# Patient Record
Sex: Female | Born: 1965
Health system: Southern US, Community
[De-identification: ages and names within clinical notes are randomized; demographics above are authoritative.]

## PROBLEM LIST (undated history)

## (undated) ENCOUNTER — Ambulatory Visit

## (undated) ENCOUNTER — Encounter

## (undated) ENCOUNTER — Encounter: Attending: Ambulatory Care | Primary: Ambulatory Care

## (undated) ENCOUNTER — Telehealth

## (undated) ENCOUNTER — Ambulatory Visit: Payer: MEDICARE

## (undated) ENCOUNTER — Ambulatory Visit: Payer: MEDICARE | Attending: General Practice | Primary: General Practice

## (undated) ENCOUNTER — Encounter: Attending: Rheumatology | Primary: Rheumatology

## (undated) ENCOUNTER — Telehealth: Attending: Ambulatory Care | Primary: Ambulatory Care

## (undated) ENCOUNTER — Ambulatory Visit: Attending: Ambulatory Care | Primary: Ambulatory Care

## (undated) ENCOUNTER — Ambulatory Visit: Payer: Medicare (Managed Care) | Attending: Rheumatology | Primary: Rheumatology

## (undated) DIAGNOSIS — M797 Fibromyalgia: Secondary | ICD-10-CM

## (undated) DIAGNOSIS — D638 Anemia in other chronic diseases classified elsewhere: Secondary | ICD-10-CM

## (undated) DIAGNOSIS — F419 Anxiety disorder, unspecified: Secondary | ICD-10-CM

## (undated) DIAGNOSIS — IMO0002 Reserved for concepts with insufficient information to code with codable children: Secondary | ICD-10-CM

## (undated) DIAGNOSIS — J309 Allergic rhinitis, unspecified: Secondary | ICD-10-CM

## (undated) DIAGNOSIS — K219 Gastro-esophageal reflux disease without esophagitis: Secondary | ICD-10-CM

## (undated) DIAGNOSIS — N2889 Other specified disorders of kidney and ureter: Secondary | ICD-10-CM

## (undated) DIAGNOSIS — R42 Dizziness and giddiness: Secondary | ICD-10-CM

## (undated) DIAGNOSIS — R011 Cardiac murmur, unspecified: Secondary | ICD-10-CM

## (undated) DIAGNOSIS — R569 Unspecified convulsions: Secondary | ICD-10-CM

## (undated) DIAGNOSIS — D892 Hypergammaglobulinemia, unspecified: Secondary | ICD-10-CM

## (undated) DIAGNOSIS — Z9889 Other specified postprocedural states: Secondary | ICD-10-CM

## (undated) DIAGNOSIS — E049 Nontoxic goiter, unspecified: Secondary | ICD-10-CM

## (undated) DIAGNOSIS — I10 Essential (primary) hypertension: Secondary | ICD-10-CM

## (undated) DIAGNOSIS — E040 Nontoxic diffuse goiter: Secondary | ICD-10-CM

## (undated) DIAGNOSIS — D72819 Decreased white blood cell count, unspecified: Secondary | ICD-10-CM

## (undated) DIAGNOSIS — M329 Systemic lupus erythematosus, unspecified: Secondary | ICD-10-CM

## (undated) HISTORY — DX: Systemic lupus erythematosus, unspecified: M32.9

## (undated) HISTORY — DX: Anemia in other chronic diseases classified elsewhere: D63.8

## (undated) HISTORY — DX: Hypergammaglobulinemia, unspecified: D89.2

## (undated) HISTORY — DX: Decreased white blood cell count, unspecified: D72.819

## (undated) HISTORY — DX: Nontoxic diffuse goiter: E04.0

## (undated) HISTORY — PX: KIDNEY SURGERY: SHX687

## (undated) HISTORY — PX: MYOMECTOMY ABDOMINAL APPROACH: SUR870

## (undated) HISTORY — PX: OTHER SURGICAL HISTORY: SHX169

## (undated) HISTORY — PX: ABDOMINAL HYSTERECTOMY: SHX81

## (undated) HISTORY — PX: TUMOR REMOVAL: SHX12

---
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
## 2003-04-11 ENCOUNTER — Other Ambulatory Visit: Admission: RE | Admit: 2003-04-11 | Discharge: 2003-04-11 | Payer: Self-pay | Admitting: Obstetrics and Gynecology

## 2003-09-11 ENCOUNTER — Encounter (INDEPENDENT_AMBULATORY_CARE_PROVIDER_SITE_OTHER): Payer: Self-pay | Admitting: Specialist

## 2003-09-11 ENCOUNTER — Ambulatory Visit (HOSPITAL_BASED_OUTPATIENT_CLINIC_OR_DEPARTMENT_OTHER): Admission: RE | Admit: 2003-09-11 | Discharge: 2003-09-11 | Payer: Self-pay | Admitting: Urology

## 2003-12-10 ENCOUNTER — Ambulatory Visit (HOSPITAL_COMMUNITY): Admission: RE | Admit: 2003-12-10 | Discharge: 2003-12-10 | Payer: Self-pay | Admitting: Pediatric Hematology

## 2003-12-17 ENCOUNTER — Encounter (HOSPITAL_COMMUNITY): Admission: RE | Admit: 2003-12-17 | Discharge: 2003-12-18 | Payer: Self-pay | Admitting: Family Medicine

## 2006-01-05 ENCOUNTER — Ambulatory Visit (HOSPITAL_COMMUNITY): Admission: RE | Admit: 2006-01-05 | Discharge: 2006-01-05 | Payer: Self-pay | Admitting: Family Medicine

## 2006-01-06 ENCOUNTER — Ambulatory Visit (HOSPITAL_COMMUNITY): Admission: RE | Admit: 2006-01-06 | Discharge: 2006-01-06 | Payer: Self-pay | Admitting: Family Medicine

## 2008-08-12 ENCOUNTER — Emergency Department (HOSPITAL_COMMUNITY): Admission: EM | Admit: 2008-08-12 | Discharge: 2008-08-12 | Payer: Self-pay | Admitting: Emergency Medicine

## 2008-08-13 ENCOUNTER — Emergency Department (HOSPITAL_COMMUNITY): Admission: EM | Admit: 2008-08-13 | Discharge: 2008-08-14 | Payer: Self-pay | Admitting: Emergency Medicine

## 2008-08-15 ENCOUNTER — Ambulatory Visit (HOSPITAL_COMMUNITY): Admission: RE | Admit: 2008-08-15 | Discharge: 2008-08-15 | Payer: Self-pay | Admitting: Family Medicine

## 2008-12-25 ENCOUNTER — Emergency Department (HOSPITAL_COMMUNITY): Admission: EM | Admit: 2008-12-25 | Discharge: 2008-12-25 | Payer: Self-pay | Admitting: Emergency Medicine

## 2009-03-14 HISTORY — PX: ABDOMINAL HYSTERECTOMY: SHX81

## 2009-03-20 ENCOUNTER — Ambulatory Visit (HOSPITAL_COMMUNITY): Admission: RE | Admit: 2009-03-20 | Discharge: 2009-03-20 | Payer: Self-pay | Admitting: Family Medicine

## 2009-03-31 ENCOUNTER — Ambulatory Visit (HOSPITAL_COMMUNITY): Admission: RE | Admit: 2009-03-31 | Discharge: 2009-03-31 | Payer: Self-pay | Admitting: Family Medicine

## 2009-04-20 ENCOUNTER — Ambulatory Visit (HOSPITAL_COMMUNITY): Payer: Self-pay | Admitting: Oncology

## 2009-07-22 ENCOUNTER — Ambulatory Visit (HOSPITAL_COMMUNITY): Admission: RE | Admit: 2009-07-22 | Discharge: 2009-07-22 | Payer: Self-pay | Admitting: Urology

## 2010-02-10 ENCOUNTER — Ambulatory Visit (HOSPITAL_COMMUNITY)
Admission: RE | Admit: 2010-02-10 | Discharge: 2010-02-10 | Payer: Self-pay | Source: Home / Self Care | Admitting: Urology

## 2010-03-19 ENCOUNTER — Ambulatory Visit (HOSPITAL_COMMUNITY)
Admission: RE | Admit: 2010-03-19 | Discharge: 2010-03-19 | Payer: Self-pay | Source: Home / Self Care | Attending: Family Medicine | Admitting: Family Medicine

## 2010-04-04 ENCOUNTER — Encounter: Payer: Self-pay | Admitting: Urology

## 2010-04-14 ENCOUNTER — Encounter: Payer: Self-pay | Admitting: Family Medicine

## 2010-06-01 LAB — CREATININE, SERUM
Creatinine, Ser: 0.78 mg/dL (ref 0.4–1.2)
GFR calc Af Amer: >60 mL/min (ref >60)
GFR calc non Af Amer: >60 mL/min (ref >60)

## 2010-06-17 LAB — POCT URINALYSIS DIP (DEVICE)
Bilirubin Urine: NEGATIVE
Glucose, UA: NEGATIVE mg/dL
Ketones, ur: NEGATIVE mg/dL
Nitrite: NEGATIVE
Protein, ur: NEGATIVE mg/dL
Specific Gravity, Urine: 1.02 (ref 1.005–1.030)
Urobilinogen, UA: 0.2 mg/dL (ref 0.0–1.0)
pH: 7 (ref 5.0–8.0)

## 2010-06-17 LAB — POCT PREGNANCY, URINE: Preg Test, Ur: NEGATIVE

## 2010-06-21 LAB — BASIC METABOLIC PANEL
GFR calc Af Amer: >60 mL/min (ref >60)
GFR calc non Af Amer: >60 mL/min (ref >60)
Potassium: 3.6 mEq/L (ref 3.5–5.1)
Sodium: 137 mEq/L (ref 135–145)

## 2010-06-21 LAB — POCT CARDIAC MARKERS
CKMB, poc: <1 ng/mL — ABNORMAL LOW (ref 1.0–8.0)
CKMB, poc: <1 ng/mL — ABNORMAL LOW (ref 1.0–8.0)
CKMB, poc: <1 ng/mL — ABNORMAL LOW (ref 1.0–8.0)
Troponin i, poc: <0.05 ng/mL (ref 0.00–0.09)
Troponin i, poc: <0.05 ng/mL (ref 0.00–0.09)
Troponin i, poc: <0.05 ng/mL (ref 0.00–0.09)

## 2010-06-21 LAB — POCT I-STAT, CHEM 8
BUN: 15 mg/dL (ref 6–23)
Calcium, Ion: 1.22 mmol/L (ref 1.12–1.32)
Chloride: 107 mEq/L (ref 96–112)
Creatinine, Ser: 0.8 mg/dL (ref 0.4–1.2)
Glucose, Bld: 102 mg/dL — ABNORMAL HIGH (ref 70–99)
HCT: 44 % (ref 36.0–46.0)
Hemoglobin: 15 g/dL (ref 12.0–15.0)
Potassium: 3.6 mEq/L (ref 3.5–5.1)
Sodium: 142 mEq/L (ref 135–145)
TCO2: 23 mmol/L (ref 0–100)

## 2010-06-21 LAB — URINALYSIS, ROUTINE W REFLEX MICROSCOPIC
Bilirubin Urine: NEGATIVE
Glucose, UA: NEGATIVE mg/dL
Ketones, ur: NEGATIVE mg/dL
Nitrite: NEGATIVE
Protein, ur: NEGATIVE mg/dL
Specific Gravity, Urine: 1.027 (ref 1.005–1.030)
Urobilinogen, UA: 1 mg/dL (ref 0.0–1.0)
pH: 6 (ref 5.0–8.0)

## 2010-06-21 LAB — URINE CULTURE: Colony Count: 6000

## 2010-06-21 LAB — DIFFERENTIAL
Eosinophils Relative: 0 % (ref 0–5)
Lymphocytes Relative: 27 % (ref 12–46)
Lymphs Abs: 1.3 10*3/uL (ref 0.7–4.0)
Monocytes Absolute: 0.5 10*3/uL (ref 0.1–1.0)
Neutro Abs: 2.8 10*3/uL (ref 1.7–7.7)

## 2010-06-21 LAB — TSH: TSH: 1.601 u[IU]/mL (ref 0.350–4.500)

## 2010-06-21 LAB — URINE MICROSCOPIC-ADD ON

## 2010-06-21 LAB — CBC
HCT: 39.6 % (ref 36.0–46.0)
Hemoglobin: 14 g/dL (ref 12.0–15.0)
RBC: 4.13 MIL/uL (ref 3.87–5.11)
WBC: 4.7 10*3/uL (ref 4.0–10.5)

## 2010-06-21 LAB — MAGNESIUM: Magnesium: 2.4 mg/dL (ref 1.5–2.5)

## 2010-06-21 LAB — POCT PREGNANCY, URINE: Preg Test, Ur: NEGATIVE

## 2010-07-30 NOTE — Op Note (Signed)
[name], Dawn Marsh                          ACCOUNT NO.:  [id number]   MEDICAL RECORD NO.:  [id number]                   PATIENT TYPE:  AMB   LOCATION:  [facility]                                 FACILITY:  [facility]   PHYSICIAN:  Sigmund I. Patsi Sears, M.D.         DATE OF BIRTH:  Sep 27, 1965   DATE OF PROCEDURE:  09/11/2003  DATE OF DISCHARGE:                                 OPERATIVE REPORT   PREOPERATIVE DIAGNOSIS:  Interstitial cystitis.   POSTOPERATIVE DIAGNOSIS:  Interstitial cystitis versus endometriosis.   OPERATION:  1. Cystourethroscopy.  2. Hydrodistention of the bladder (1000 mL).  3. Cold cup bladder biopsy of left biopsy base left lateral bladder wall.  4. Cauterization of the biopsy sites.  5. Instillation of Clorpactin x 3 minutes.  6. Instillation of Pyridium/Marcaine.  7. Injection of Marcaine and Kenalog at the bladder base.   SURGEON:  Sigmund I. Patsi Sears, M.D.   ANESTHESIA:  General (LMA).   PREPARATION:  After appropriate preanesthesia, the patient is brought to the  operating room and placed on the operating table in the dorsal supine  position where general LMA anesthesia was introduced.  She was then placed  in the dorsal lithotomy position where the pubis was prepped with Betadine  solution and draped in the usual fashion.   REVIEW OF HISTORY:  This 45 year old female has a history of perineal pain,  dysuria which is cyclical, predominantly premenstrual.  She has had  suprapubic pain on urination, particularly during menstrual cycle.  She has  urinary frequency and has had her symptoms since age 45.  She also notes  dyspareunia with no fever, no chills, no kidney stones, no gross hematuria,  no fibromyalgia, no irritable bowel, and no asthma.  The patient is now for  cystoscopy, bladder biopsy, evaluation to rule out IC.  Note, her IC symptom  score; she is 18.   DESCRIPTION OF PROCEDURE:  Cystourethroscopy was accomplished.  The bladder  holds 1000  mL.  A markedly abnormal area is noted at the left lateral  bladder wall, above and lateral to the left ureteral orifice.  This abnormal  area consists of 2 definite chocolate cyst appearing lesions and also an  area of chronic inflammatory polyps in the bladder with raised irritated,  erythematous base.  All three areas are excisionally biopsied with the cold  cup biopsy forceps and the entire area cauterized.  The remaining bladder  appeared normal, and Clorpactin was inserted for 3 minutes.  Then Pyridium  and Marcaine was inserted.  There was some hemorrhage of the remaining  bladder, but definite IC is not noted.  The bladder base is injected with  Marcaine/Kenalog, and the patient is awakened and taken to the recovery room  in good condition.  Note, she is LATEX allergic, and all latex allergy  precautions are followed.  Sigmund I. Patsi Sears, M.D.    SIT/MEDQ  D:  09/11/2003  T:  09/11/2003  Job:  [id number]

## 2010-11-16 ENCOUNTER — Other Ambulatory Visit (HOSPITAL_COMMUNITY): Payer: Self-pay | Admitting: Urology

## 2010-11-16 DIAGNOSIS — D49519 Neoplasm of unspecified behavior of unspecified kidney: Secondary | ICD-10-CM

## 2010-12-11 ENCOUNTER — Ambulatory Visit (HOSPITAL_COMMUNITY)
Admission: RE | Admit: 2010-12-11 | Discharge: 2010-12-11 | Disposition: A | Discharge status: Home / Self Care | Payer: 59 | Source: Ambulatory Visit | Attending: Urology | Admitting: Urology

## 2010-12-11 DIAGNOSIS — N289 Disorder of kidney and ureter, unspecified: Secondary | ICD-10-CM | POA: Insufficient documentation

## 2010-12-11 DIAGNOSIS — K7689 Other specified diseases of liver: Secondary | ICD-10-CM | POA: Insufficient documentation

## 2010-12-11 DIAGNOSIS — D49519 Neoplasm of unspecified behavior of unspecified kidney: Secondary | ICD-10-CM

## 2010-12-11 DIAGNOSIS — D1803 Hemangioma of intra-abdominal structures: Secondary | ICD-10-CM | POA: Insufficient documentation

## 2010-12-11 MED ORDER — GADOPENTETATE DIMEGLUMINE 469.01 MG/ML IV SOLN
15.0000 mL | Freq: Once | INTRAVENOUS | Status: AC | PRN
  Administered 2010-12-11: 15 mL via INTRAVENOUS

## 2011-02-17 ENCOUNTER — Encounter: Payer: Self-pay | Admitting: *Deleted

## 2011-02-17 ENCOUNTER — Emergency Department (HOSPITAL_COMMUNITY)
Admission: EM | Admit: 2011-02-17 | Discharge: 2011-02-17 | Disposition: A | Discharge status: Home / Self Care | Payer: Worker's Compensation | Attending: Emergency Medicine | Admitting: Emergency Medicine

## 2011-02-17 DIAGNOSIS — R51 Headache: Secondary | ICD-10-CM | POA: Insufficient documentation

## 2011-02-17 DIAGNOSIS — Z9189 Other specified personal risk factors, not elsewhere classified: Secondary | ICD-10-CM | POA: Insufficient documentation

## 2011-02-17 DIAGNOSIS — G43909 Migraine, unspecified, not intractable, without status migrainosus: Secondary | ICD-10-CM | POA: Insufficient documentation

## 2011-02-17 DIAGNOSIS — R42 Dizziness and giddiness: Secondary | ICD-10-CM | POA: Insufficient documentation

## 2011-02-17 HISTORY — DX: Dizziness and giddiness: R42

## 2011-02-17 LAB — CARBOXYHEMOGLOBIN: Methemoglobin: 0.4 % (ref 0.0–1.5)

## 2011-02-17 NOTE — ED Provider Notes (Signed)
History     CSN: [id number] Arrival date & time: 02/17/2011  3:03 PM   First MD Initiated Contact with Patient 02/17/11 1754      Chief Complaint  Patient presents with  . Labs Only    Carbon monoxide testing    (Consider location/radiation/quality/duration/timing/severity/associated sxs/prior treatment) HPI  Past Medical History  Diagnosis Date  . Vertigo   . Migraine     History reviewed. No pertinent past surgical history.  History reviewed. No pertinent family history.  History  Substance Use Topics  . Smoking status: Never Smoker   . Smokeless tobacco: Not on file  . Alcohol Use: Yes    OB History    Grav Para Term Preterm Abortions TAB SAB Ect Mult Living                  Review of Systems  Allergies  Penicillins  Home Medications   Current Outpatient Rx  Name Route Sig Dispense Refill  . DIAZEPAM 2 MG PO TABS Oral Take 2 mg by mouth every 6 (six) hours as needed. For vertigo    . ELETRIPTAN HYDROBROMIDE 20 MG PO TABS Oral One tablet by mouth as needed for migraine headache.  If the headache improves and then returns, dose may be repeated after 2 hours have elapsed since first dose (do not exceed 80 mg per day). For migraine may repeat in 2 hours if necessary     . HYDROCHLOROTHIAZIDE 25 MG PO TABS Oral Take 25 mg by mouth daily.      Marland Kitchen POTASSIUM CHLORIDE CRYS CR 20 MEQ PO TBCR Oral Take 20 mEq by mouth daily.        BP 133/89  Pulse 93  Temp(Src) 98.5 F (36.9 C) (Oral)  Resp 16  SpO2 100%  Physical Exam  ED Course  Procedures (including critical care time)   Labs Reviewed  CARBOXYHEMOGLOBIN   No results found.   1. History of carbon monoxide poisoning       MDM  Carboxy hemoglobin is 13.4, which was is within normal range        Arman Filter, NP 02/17/11 2142

## 2011-02-17 NOTE — ED Notes (Signed)
PBT called back to BS to draw carboxy hgb, initially sent away by pt, pt OK with lab draw, PBT and RT aware, RT waiting on sample from PBT. Pt, RN and MD aware. Pt calm, NAD, alert, interactive.

## 2011-02-17 NOTE — ED Notes (Signed)
Pt sent here or carbon monoxide testing; pt sts bad smell at work and sent here by [employer]

## 2011-02-17 NOTE — ED Notes (Signed)
Report given and care endorsed to John, RN.

## 2011-02-17 NOTE — ED Notes (Signed)
Pt reports that she may have accidentally been exposed to carbon monoxide at her work place.  She works for a company that [employer]".  She reports that for a few weeks she has observed a "weird smell" and reported it to her employer and was sent here for testing to determine if there has been an adverse reaction. She states that sometimes she feels dizzy and a little nauseated without emesis. She also reports mild intermittent dyspnea.  She denies N/V and dyspnea at this time.

## 2011-02-17 NOTE — ED Provider Notes (Signed)
History     CSN: [id number] Arrival date & time: 02/17/2011  3:03 PM   First MD Initiated Contact with Patient 02/17/11 1754      Chief Complaint  Patient presents with  . Labs Only    Carbon monoxide testing    (Consider location/radiation/quality/duration/timing/severity/associated sxs/prior treatment) The history is provided by the patient.   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 Patient relates a history of having bad smell at work and having problems with some mild headaches, she was referred by [employer]. in the health care clinic at work. She has now been away from the work environment for approximately 5 hours. She is currently without symptoms. Patient is a non-smoker. She does relate a history that she would feel better when she would go home and symptoms reoccur when she would get back to work. Past medical history is negative for history of migraines and vertigo, and based on medications history of possibly hypertension.  Past Medical History  Diagnosis Date  . Vertigo   . Migraine     History reviewed. No pertinent past surgical history.  History reviewed. No pertinent family history.  History  Substance Use Topics  . Smoking status: Never Smoker   . Smokeless tobacco: Not on file  . Alcohol Use: Yes    OB History    Grav Para Term Preterm Abortions TAB SAB Ect Mult Living                  Review of Systems  Constitutional: Negative for fever, chills, diaphoresis and fatigue.  HENT: Negative for congestion, sore throat, neck pain and sinus pressure.   Respiratory: Negative for cough, chest tightness, shortness of breath and wheezing.   Cardiovascular: Negative for chest pain and palpitations.  Gastrointestinal: Negative for nausea, vomiting, abdominal pain and diarrhea.    Genitourinary: Negative for dysuria and hematuria.  Musculoskeletal: Negative for myalgias and back pain.  Skin: Negative for pallor and rash.  Neurological: Positive for headaches. Negative for dizziness, seizures, syncope, speech difficulty and weakness.  Psychiatric/Behavioral: Negative for confusion.    Allergies  Penicillins  Home Medications   Current Outpatient Rx  Name Route Sig Dispense Refill  . DIAZEPAM 2 MG PO TABS Oral Take 2 mg by mouth every 6 (six) hours as needed. For vertigo    . ELETRIPTAN HYDROBROMIDE 20 MG PO TABS Oral One tablet by mouth as needed for migraine headache.  If the headache improves and then returns, dose may be repeated after 2 hours have elapsed since first dose (do not exceed 80 mg per day). For migraine may repeat in 2 hours if necessary     . HYDROCHLOROTHIAZIDE 25 MG PO TABS Oral Take 25 mg by mouth daily.      Marland Kitchen POTASSIUM CHLORIDE CRYS CR 20 MEQ PO TBCR Oral Take 20 mEq by mouth daily.        BP 123/85  Pulse 93  Temp(Src) 98.5 F (36.9 C) (Oral)  Resp 20  SpO2 100%  Physical Exam  Nursing note and vitals reviewed. Constitutional: She is oriented to person, place, and time. She appears well-developed and well-nourished. No distress.  HENT:  Head: Normocephalic and atraumatic.  Mouth/Throat: Oropharynx is clear and moist.  Eyes: Conjunctivae and EOM are normal. Pupils are equal, round, and reactive to  light.  Neck: Normal range of motion. Neck supple.  Cardiovascular: Normal rate, regular rhythm, normal heart sounds and intact distal pulses.   No murmur heard. Pulmonary/Chest: Effort normal and breath sounds normal.  Musculoskeletal: Normal range of motion. She exhibits no edema.  Neurological: She is alert and oriented to person, place, and time. No cranial nerve deficit. She exhibits normal muscle tone. Coordination normal.  Skin: Skin is warm. No rash noted. No erythema.    ED Course  Procedures (including critical care  time)   Labs Reviewed  CARBOXYHEMOGLOBIN   No results found.   Impression: Possible work related carbon oxide exposure.   MDM   Carbon monoxide level pending. Patient referred in by work for possible carbon monoxide exposure. Patient is not certain whether an atmospheric testing was done in the work environment to determine if there is elevated levels of carbon monoxide, however she was feeling one that was referred in for testing. He shouldn't double W. works in this one area she has been having some complaints of not feeling well at work strange smell in the carbon monoxide normally odorless and some mild headaches, this usually improves when she goes home and then recurs when she gets back to work. Patient is being followed by the health clinic to work. Currently patient is with minimal symptoms however she has been out of work environment now for approximately 5 hours.   A carbon monoxide level is still pending, patient will be turned over to the evening mid-level will followup on the results and as per the patient appropriately.  Medical screening examination/treatment/procedure(s) were conducted as a shared visit with non-physician practitioner(s) and myself.  I personally evaluated the patient during the encounter     Shelda Jakes, MD 02/17/11 2002

## 2011-02-18 NOTE — ED Provider Notes (Signed)
Medical screening examination/treatment/procedure(s) were conducted as a shared visit with non-physician practitioner(s) and myself.  I personally evaluated the patient during the encounter  Results for orders placed during the hospital encounter of 02/17/11  CARBOXYHEMOGLOBIN      Component Value Range   Total hemoglobin 13.0  12.5 - 16.0 (g/dL)   O2 Saturation 16.1     Carboxyhemoglobin 0.5  0.5 - 1.5 (%)   Methemoglobin 0.4  0.0 - 1.5 (%)    CO level per lab within normal levels not cw sig CO exposure exceot as noted in my original note patient has been away from work space for several hours. Work air still needs to be tested.     Shelda Jakes, MD 02/18/11 1149

## 2011-04-22 ENCOUNTER — Ambulatory Visit (HOSPITAL_COMMUNITY)
Admission: RE | Admit: 2011-04-22 | Discharge: 2011-04-22 | Disposition: A | Discharge status: Home / Self Care | Payer: 59 | Source: Ambulatory Visit | Attending: Family Medicine | Admitting: Family Medicine

## 2011-04-22 ENCOUNTER — Other Ambulatory Visit (HOSPITAL_COMMUNITY): Payer: Self-pay | Admitting: Family Medicine

## 2011-04-22 DIAGNOSIS — R079 Chest pain, unspecified: Secondary | ICD-10-CM | POA: Insufficient documentation

## 2011-05-11 ENCOUNTER — Other Ambulatory Visit: Payer: Self-pay | Admitting: Urology

## 2011-05-11 DIAGNOSIS — N2889 Other specified disorders of kidney and ureter: Secondary | ICD-10-CM

## 2011-05-17 ENCOUNTER — Ambulatory Visit
Admission: RE | Admit: 2011-05-17 | Discharge: 2011-05-17 | Disposition: A | Discharge status: Home / Self Care | Payer: 59 | Source: Ambulatory Visit | Attending: Urology | Admitting: Urology

## 2011-05-17 VITALS — BP 106/77 | HR 96 | Temp 98.2°F | Resp 16 | Ht 65.0 in | Wt 138.0 lb

## 2011-05-17 DIAGNOSIS — N2889 Other specified disorders of kidney and ureter: Secondary | ICD-10-CM

## 2011-05-17 HISTORY — DX: Nontoxic goiter, unspecified: E04.9

## 2011-05-17 HISTORY — DX: Essential (primary) hypertension: I10

## 2011-05-17 HISTORY — DX: Other specified disorders of kidney and ureter: N28.89

## 2011-05-24 ENCOUNTER — Other Ambulatory Visit (HOSPITAL_COMMUNITY): Payer: Self-pay | Admitting: Interventional Radiology

## 2011-05-24 DIAGNOSIS — N2889 Other specified disorders of kidney and ureter: Secondary | ICD-10-CM

## 2011-06-10 ENCOUNTER — Other Ambulatory Visit: Payer: Self-pay | Admitting: Radiology

## 2011-06-13 ENCOUNTER — Encounter (HOSPITAL_COMMUNITY): Payer: Self-pay | Admitting: Pharmacy Technician

## 2011-06-13 ENCOUNTER — Encounter (HOSPITAL_COMMUNITY)
Admission: RE | Admit: 2011-06-13 | Discharge: 2011-06-13 | Disposition: A | Discharge status: Home / Self Care | Payer: 59 | Source: Ambulatory Visit | Attending: Interventional Radiology | Admitting: Interventional Radiology

## 2011-06-13 ENCOUNTER — Encounter (HOSPITAL_COMMUNITY): Payer: Self-pay

## 2011-06-13 HISTORY — DX: Gastro-esophageal reflux disease without esophagitis: K21.9

## 2011-06-13 HISTORY — DX: Anxiety disorder, unspecified: F41.9

## 2011-06-13 HISTORY — DX: Unspecified convulsions: R56.9

## 2011-06-13 HISTORY — DX: Cardiac murmur, unspecified: R01.1

## 2011-06-13 LAB — BASIC METABOLIC PANEL
BUN: 15 mg/dL (ref 6–23)
Chloride: 105 mEq/L (ref 96–112)
GFR calc Af Amer: >90 mL/min (ref >90)
GFR calc non Af Amer: >90 mL/min (ref >90)
Glucose, Bld: 83 mg/dL (ref 70–99)
Potassium: 3.5 mEq/L (ref 3.5–5.1)
Sodium: 137 mEq/L (ref 135–145)

## 2011-06-13 LAB — PROTIME-INR
INR: 1 (ref 0.00–1.49)
Prothrombin Time: 13.4 seconds (ref 11.6–15.2)

## 2011-06-13 LAB — CBC
HCT: 33.9 % — ABNORMAL LOW (ref 36.0–46.0)
Hemoglobin: 11.6 g/dL — ABNORMAL LOW (ref 12.0–15.0)
MCHC: 34.2 g/dL (ref 30.0–36.0)
RBC: 3.64 MIL/uL — ABNORMAL LOW (ref 3.87–5.11)

## 2011-06-13 NOTE — Patient Instructions (Addendum)
YOUR SURGERY IS SCHEDULED ON:  Friday  4/12  AT 8:00 AM  REPORT TO [facility] AT: 6:30 AM      PHONE # FOR SHORT STAY IS [phone]  DO NOT EAT OR DRINK ANYTHING AFTER MIDNIGHT THE NIGHT BEFORE YOUR SURGERY.  YOU MAY BRUSH YOUR TEETH, RINSE OUT YOUR MOUTH--BUT NO WATER, NO FOOD, NO CHEWING GUM, NO MINTS, NO CANDIES, NO CHEWING TOBACCO.  PLEASE TAKE THE FOLLOWING MEDICATIONS THE AM OF YOUR SURGERY WITH A FEW SIPS OF WATER:  NO MEDS TO TAKE    IF YOU USE INHALERS--USE YOUR INHALERS THE AM OF YOUR SURGERY AND BRING INHALERS TO THE HOSPITAL -TAKE TO SURGERY.    IF YOU ARE DIABETIC:  DO NOT TAKE ANY DIABETIC MEDICATIONS THE AM OF YOUR SURGERY.  IF YOU TAKE INSULIN IN THE EVENINGS--PLEASE ONLY TAKE 1/2 NORMAL EVENING DOSE THE NIGHT BEFORE YOUR SURGERY.  NO INSULIN THE AM OF YOUR SURGERY.  IF YOU HAVE SLEEP APNEA AND USE CPAP OR BIPAP--PLEASE BRING THE MASK --NOT THE MACHINE-NOT THE TUBING   -JUST THE MASK. DO NOT BRING VALUABLES, MONEY, CREDIT CARDS.  CONTACT LENS, DENTURES / PARTIALS, GLASSES SHOULD NOT BE WORN TO SURGERY AND IN MOST CASES-HEARING AIDS WILL NEED TO BE REMOVED.  BRING YOUR GLASSES CASE, ANY EQUIPMENT NEEDED FOR YOUR CONTACT LENS. FOR PATIENTS ADMITTED TO THE HOSPITAL--CHECK OUT TIME THE DAY OF DISCHARGE IS 11:00 AM.  ALL INPATIENT ROOMS ARE PRIVATE - WITH BATHROOM, TELEPHONE, TELEVISION AND WIFI INTERNET. IF YOU ARE BEING DISCHARGED THE SAME DAY OF YOUR SURGERY--YOU CAN NOT DRIVE YOURSELF HOME--AND SHOULD NOT GO HOME ALONE BY TAXI OR BUS.  NO DRIVING OR OPERATING MACHINERY FOR 24 HOURS FOLLOWING ANESTHESIA / PAIN MEDICATIONS.                              PLEASE READ OVER ANY  FACT SHEETS THAT YOU WERE GIVEN:   BLOOD TRANSFUSION INFORMATION

## 2011-06-13 NOTE — Pre-Procedure Instructions (Signed)
PT HAS CXR REPORT FROM [facility] FROM 04/22/11--REPORT IN EPIC AND COPY ON THIS CHART. EKG REPORT WITH OFFICE NOTE 04/22/2011 FROM DR. GOLDING ON THIS CHART.  THE OFFICE NOTE SAID PT C/O OF CHEST PAIN -PT WAS ENCOURAGED TO FOLLOW UP IF WORSENS OR FAILS TO IMPROVE / REFERRAL TO CARDIOLOGY.  PT STATES SHE NEVER WENT TO CARDIOLOGY--STILL HAS THE CHEST PAIN-LEFT SIDED--CONSTANT--SOMETIMES GOES AWAY SHORT WHILE-ALWAYS RETURNS.  PT INSTRUCTED TO FOLLOW UP WITH CARDIOLOGY THIS WEEK--TO AVOID POSSIBLE CANCELLATION OF SURGERY.  PT STATES SHE WILL FOLLOW UP AND SHE KNOWS TO HAVE ANY CARDIOLOGY CLEARANCE FAXED TO DR. Antonietta Jewel OFFICE. CBC, BMET, PT, PTT DRAWN TODAY PREOP.  T/S WILL BE DRAWN THE DAY OF SURGERY.

## 2011-06-21 ENCOUNTER — Other Ambulatory Visit: Payer: Self-pay | Admitting: Radiology

## 2011-06-23 ENCOUNTER — Telehealth (HOSPITAL_COMMUNITY): Payer: Self-pay | Admitting: Interventional Radiology

## 2011-06-24 ENCOUNTER — Ambulatory Visit (HOSPITAL_COMMUNITY): Payer: 59 | Admitting: *Deleted

## 2011-06-24 ENCOUNTER — Encounter (HOSPITAL_COMMUNITY): Payer: Self-pay

## 2011-06-24 ENCOUNTER — Ambulatory Visit (HOSPITAL_COMMUNITY)
Admission: RE | Admit: 2011-06-24 | Discharge: 2011-06-25 | Disposition: A | Discharge status: Home / Self Care | Payer: 59 | Source: Ambulatory Visit | Attending: Interventional Radiology | Admitting: Interventional Radiology

## 2011-06-24 ENCOUNTER — Encounter (HOSPITAL_COMMUNITY)
Admission: RE | Disposition: A | Discharge status: Home / Self Care | Payer: Self-pay | Source: Ambulatory Visit | Attending: Interventional Radiology

## 2011-06-24 ENCOUNTER — Ambulatory Visit (HOSPITAL_COMMUNITY)
Admission: RE | Admit: 2011-06-24 | Discharge: 2011-06-24 | Disposition: A | Discharge status: Home / Self Care | Payer: 59 | Source: Ambulatory Visit | Attending: Interventional Radiology | Admitting: Interventional Radiology

## 2011-06-24 ENCOUNTER — Ambulatory Visit (HOSPITAL_COMMUNITY): Payer: 59

## 2011-06-24 ENCOUNTER — Encounter (HOSPITAL_COMMUNITY): Payer: Self-pay | Admitting: *Deleted

## 2011-06-24 DIAGNOSIS — N2889 Other specified disorders of kidney and ureter: Secondary | ICD-10-CM | POA: Diagnosis present

## 2011-06-24 DIAGNOSIS — R0789 Other chest pain: Secondary | ICD-10-CM | POA: Insufficient documentation

## 2011-06-24 DIAGNOSIS — D3000 Benign neoplasm of unspecified kidney: Secondary | ICD-10-CM | POA: Insufficient documentation

## 2011-06-24 DIAGNOSIS — Z01812 Encounter for preprocedural laboratory examination: Secondary | ICD-10-CM | POA: Insufficient documentation

## 2011-06-24 DIAGNOSIS — I10 Essential (primary) hypertension: Secondary | ICD-10-CM | POA: Insufficient documentation

## 2011-06-24 DIAGNOSIS — J9383 Other pneumothorax: Secondary | ICD-10-CM | POA: Insufficient documentation

## 2011-06-24 DIAGNOSIS — Z90710 Acquired absence of both cervix and uterus: Secondary | ICD-10-CM | POA: Insufficient documentation

## 2011-06-24 DIAGNOSIS — K219 Gastro-esophageal reflux disease without esophagitis: Secondary | ICD-10-CM | POA: Insufficient documentation

## 2011-06-24 DIAGNOSIS — Z79899 Other long term (current) drug therapy: Secondary | ICD-10-CM | POA: Insufficient documentation

## 2011-06-24 HISTORY — DX: Allergic rhinitis, unspecified: J30.9

## 2011-06-24 LAB — TYPE AND SCREEN: Antibody Screen: NEGATIVE

## 2011-06-24 SURGERY — RADIO FREQUENCY ABLATION
Anesthesia: General | Laterality: Left | Wound class: Clean

## 2011-06-24 MED ORDER — ONDANSETRON HCL 4 MG/2ML IJ SOLN
INTRAMUSCULAR | Status: DC | PRN
  Administered 2011-06-24: 4 mg via INTRAVENOUS

## 2011-06-24 MED ORDER — LACTATED RINGERS IV SOLN
INTRAVENOUS | Status: DC
  Administered 2011-06-24 (×3): via INTRAVENOUS

## 2011-06-24 MED ORDER — EPHEDRINE SULFATE 50 MG/ML IJ SOLN
INTRAMUSCULAR | Status: DC | PRN
  Administered 2011-06-24: 5 mg via INTRAVENOUS

## 2011-06-24 MED ORDER — VANCOMYCIN HCL IN DEXTROSE 1-5 GM/200ML-% IV SOLN
INTRAVENOUS | Status: AC
  Administered 2011-06-24: 1000 mg via INTRAVENOUS
  Filled 2011-06-24: qty 200

## 2011-06-24 MED ORDER — DEXAMETHASONE SODIUM PHOSPHATE 10 MG/ML IJ SOLN
INTRAMUSCULAR | Status: DC | PRN
  Administered 2011-06-24: 5 mg via INTRAVENOUS

## 2011-06-24 MED ORDER — PROMETHAZINE HCL 25 MG/ML IJ SOLN: 6.2500 mg | INTRAMUSCULAR | Status: DC | PRN

## 2011-06-24 MED ORDER — SUCCINYLCHOLINE CHLORIDE 20 MG/ML IJ SOLN
INTRAMUSCULAR | Status: DC | PRN
  Administered 2011-06-24: 100 mg via INTRAVENOUS

## 2011-06-24 MED ORDER — SENNOSIDES-DOCUSATE SODIUM 8.6-50 MG PO TABS
1.0000 | ORAL_TABLET | Freq: Every day | ORAL | Status: DC | PRN
  Filled 2011-06-24: qty 1

## 2011-06-24 MED ORDER — METOCLOPRAMIDE HCL 5 MG/ML IJ SOLN
INTRAMUSCULAR | Status: DC | PRN
  Administered 2011-06-24: 10 mg via INTRAVENOUS

## 2011-06-24 MED ORDER — NEOSTIGMINE METHYLSULFATE 1 MG/ML IJ SOLN
INTRAMUSCULAR | Status: DC | PRN
  Administered 2011-06-24: 4 mg via INTRAVENOUS

## 2011-06-24 MED ORDER — ONDANSETRON HCL 4 MG/2ML IJ SOLN
4.0000 mg | Freq: Four times a day (QID) | INTRAMUSCULAR | Status: DC | PRN
  Administered 2011-06-24: 4 mg via INTRAVENOUS
  Filled 2011-06-24: qty 2

## 2011-06-24 MED ORDER — LIDOCAINE HCL (CARDIAC) 20 MG/ML IV SOLN
INTRAVENOUS | Status: DC | PRN
  Administered 2011-06-24: 50 mg via INTRAVENOUS

## 2011-06-24 MED ORDER — HYDROCODONE-ACETAMINOPHEN 5-325 MG PO TABS: 1.0000 | ORAL_TABLET | ORAL | Status: DC | PRN

## 2011-06-24 MED ORDER — FENTANYL CITRATE 0.05 MG/ML IJ SOLN
INTRAMUSCULAR | Status: DC | PRN
  Administered 2011-06-24: 100 ug via INTRAVENOUS
  Administered 2011-06-24: 50 ug via INTRAVENOUS
  Administered 2011-06-24: 25 ug via INTRAVENOUS

## 2011-06-24 MED ORDER — DOCUSATE SODIUM 100 MG PO CAPS
100.0000 mg | ORAL_CAPSULE | Freq: Two times a day (BID) | ORAL | Status: DC
  Administered 2011-06-24 – 2011-06-25 (×3): 100 mg via ORAL
  Filled 2011-06-24 (×4): qty 1

## 2011-06-24 MED ORDER — PROMETHAZINE HCL 25 MG/ML IJ SOLN
INTRAMUSCULAR | Status: AC
  Filled 2011-06-24: qty 1

## 2011-06-24 MED ORDER — MIDAZOLAM HCL 5 MG/5ML IJ SOLN
INTRAMUSCULAR | Status: DC | PRN
  Administered 2011-06-24: 2 mg via INTRAVENOUS

## 2011-06-24 MED ORDER — HYDROMORPHONE HCL PF 1 MG/ML IJ SOLN
INTRAMUSCULAR | Status: AC
  Filled 2011-06-24: qty 1

## 2011-06-24 MED ORDER — CISATRACURIUM BESYLATE 2 MG/ML IV SOLN
INTRAVENOUS | Status: DC | PRN
  Administered 2011-06-24: 2 mg via INTRAVENOUS
  Administered 2011-06-24: 3 mg via INTRAVENOUS
  Administered 2011-06-24: 5 mg via INTRAVENOUS

## 2011-06-24 MED ORDER — GLYCOPYRROLATE 0.2 MG/ML IJ SOLN
INTRAMUSCULAR | Status: DC | PRN
  Administered 2011-06-24: .6 mg via INTRAVENOUS

## 2011-06-24 MED ORDER — ROCURONIUM BROMIDE 100 MG/10ML IV SOLN
INTRAVENOUS | Status: DC | PRN
  Administered 2011-06-24: 5 mg via INTRAVENOUS

## 2011-06-24 MED ORDER — HYDROMORPHONE HCL PF 1 MG/ML IJ SOLN
0.2500 mg | INTRAMUSCULAR | Status: DC | PRN
  Administered 2011-06-24 (×2): 0.5 mg via INTRAVENOUS

## 2011-06-24 MED ORDER — VANCOMYCIN HCL IN DEXTROSE 1-5 GM/200ML-% IV SOLN: 1000.0000 mg | INTRAVENOUS | Status: DC

## 2011-06-24 MED ORDER — PROPOFOL 10 MG/ML IV BOLUS
INTRAVENOUS | Status: DC | PRN
Start: 2011-06-24 — End: 2011-06-24
  Administered 2011-06-24: 190 mg via INTRAVENOUS

## 2011-06-24 NOTE — Anesthesia Preprocedure Evaluation (Addendum)
Anesthesia Evaluation  Patient identified by MRN, date of birth, ID band Patient awake    Reviewed: Allergy & Precautions, H&P , NPO status , Patient's Chart, lab work & pertinent test results, reviewed documented beta blocker date and time   Airway Mallampati: II TM Distance: >3 FB Neck ROM: Full   Comment: Goiter, + dysphagia Nl CXR, no tracheal deviation Dental  (+) Teeth Intact and Dental Advisory Given   Pulmonary neg pulmonary ROS,  breath sounds clear to auscultation        Cardiovascular hypertension, Pt. on medications Rhythm:Regular Rate:Normal  Nl exam w/ cardiology this week   Neuro/Psych negative neurological ROS  negative psych ROS   GI/Hepatic negative GI ROS, Neg liver ROS,   Endo/Other  negative endocrine ROS  Renal/GU Renal mass-left  negative genitourinary   Musculoskeletal negative musculoskeletal ROS (+)   Abdominal   Peds negative pediatric ROS (+)  Hematology negative hematology ROS (+)   Anesthesia Other Findings   Reproductive/Obstetrics negative OB ROS                          Anesthesia Physical Anesthesia Plan  ASA: II  Anesthesia Plan: General   Post-op Pain Management:    Induction: Intravenous  Airway Management Planned: Oral ETT  Additional Equipment:   Intra-op Plan:   Post-operative Plan: Extubation in OR  Informed Consent: I have reviewed the patients History and Physical, chart, labs and discussed the procedure including the risks, benefits and alternatives for the proposed anesthesia with the patient or authorized representative who has indicated his/her understanding and acceptance.   Dental advisory given  Plan Discussed with: CRNA and Surgeon  Anesthesia Plan Comments:         Anesthesia Quick Evaluation

## 2011-06-24 NOTE — H&P (Signed)
Dawn Marsh is an 46 y.o. female.   Chief Complaint: left renal lesion with slight growth - incidental finding. HPI: Incidental finding of renal lesion . Patient seen in consult with Dr. Irish Lack in regards to needle core biopsy and cryoablation - please see full radiology report for details. Patient denies any new acute symptoms or changes in her medical history since consultation.   Past Medical History  Diagnosis Date  . Vertigo   . Left renal mass   . Hypertension   . Goiter     sometimes trouble swallowing  . Chest pain     constant-ongoing for years--sometimes eases up--but always comes back  . Heart murmur   . GERD (gastroesophageal reflux disease)     burning in stomach and gas--no med  . Seizures     migraines  . Migraine   . Anxiety   . Rhinitis, allergic     seasonal    Past Surgical History  Procedure Date  . Myomectomy x 2 (prior to the hysterectomy) 2002 and 2006  . Abdominal hysterectomy 2011    No family history on file. Social History:  reports that she has never smoked. She has never used smokeless tobacco. She reports that she drinks alcohol. She reports that she does not use illicit drugs.  Allergies:  Allergies  Allergen Reactions  . Amoxil (Amoxicillin Trihydrate) Hives and Diarrhea  . Penicillins Hives  . Latex Rash    Medications Prior to Admission  Medication Dose Route Frequency Provider Last Rate Last Dose  . lactated ringers infusion   Intravenous Continuous D Jeananne Rama, PA 20 mL/hr at 06/24/11 0705    . vancomycin (VANCOCIN) 1 GM/200ML IVPB        1,000 mg at 06/24/11 0814  . vancomycin (VANCOCIN) IVPB 1000 mg/200 mL premix  1,000 mg Intravenous On Call D Jeananne Rama, PA       Medications Prior to Admission  Medication Sig Dispense Refill  . diazepam (VALIUM) 2 MG tablet Take 1 mg by mouth every 6 (six) hours as needed. For vertigo      . eletriptan (RELPAX) 20 MG tablet One tablet by mouth as needed for migraine headache.   If the headache improves and then returns, dose may be repeated after 2 hours have elapsed since first dose (do not exceed 80 mg per day). For migraine may repeat in 2 hours if necessary      . hydrochlorothiazide (HYDRODIURIL) 25 MG tablet Take 25 mg by mouth at bedtime.       Marland Kitchen loratadine (CLARITIN) 10 MG tablet Take 10 mg by mouth daily as needed. Allergies       . polyethylene glycol powder (GLYCOLAX/MIRALAX) powder Take 17 g by mouth every three (3) days as needed.      . potassium chloride SA (K-DUR,KLOR-CON) 20 MEQ tablet Take 20 mEq by mouth daily.         No results found for this or any previous visit (from the past 48 hour(s)). No results found.  Review of Systems  Constitutional: Negative for fever, chills and weight loss.  HENT: Negative.   Respiratory: Negative.   Cardiovascular: Positive for chest pain. Negative for leg swelling.       Occasional intermittent CP - cleared by cardiology   Gastrointestinal: Negative.   Musculoskeletal: Negative.   Skin: Negative.   Neurological: Negative.   Endo/Heme/Allergies: Negative.   Psychiatric/Behavioral: Negative.     Physical Exam  Constitutional: She is oriented to  person, place, and time. She appears well-developed and well-nourished.  HENT:  Head: Normocephalic and atraumatic.  Eyes: Pupils are equal, round, and reactive to light.  Neck: Normal range of motion.  Cardiovascular: Normal rate, normal heart sounds and intact distal pulses.  Exam reveals no gallop and no friction rub.   No murmur heard. Respiratory: Effort normal and breath sounds normal. No respiratory distress. She has no wheezes. She has no rales.  GI: Soft. Bowel sounds are normal. She exhibits no distension. There is no tenderness.  Musculoskeletal: Normal range of motion.  Neurological: She is alert and oriented to person, place, and time.  Psychiatric: She has a normal mood and affect. Her behavior is normal. Judgment and thought content normal.      Assessment/Plan Patient has been consulted for renal lesion needle biopsy and cryoablation under general anesthesia.  She has had all her questions answered to her satisafaction. Written consent obtained and labs WNL to proceed.   Dawn Marsh D 06/24/2011, 8:36 AM

## 2011-06-24 NOTE — Progress Notes (Signed)
Subjective: Mild left flank/chest pain.  Objective: Vital signs in last 24 hours: Temp:  [96.9 F (36.1 C)-98.1 F (36.7 C)] 97.3 F (36.3 C) (04/12 1542) Pulse Rate:  [62-90] 62  (04/12 1542) Resp:  [13-20] 18  (04/12 1542) BP: (114-146)/(67-83) 119/72 mmHg (04/12 1542) SpO2:  [100 %] 100 % (04/12 1542) Weight:  [141 lb 12.1 oz (64.3 kg)] 141 lb 12.1 oz (64.3 kg) (04/12 1327)    Intake/Output from previous day:   Intake/Output this shift:     Abdomen:  Mild left flank tenderness.  Lab Results:  No results found for this basename: WBC:2,HGB:2,HCT:2,PLT:2 in the last 72 hours BMET No results found for this basename: NA:2,K:2,CL:2,CO2:2,GLUCOSE:2,BUN:2,CREATININE:2,CALCIUM:2 in the last 72 hours PT/INR No results found for this basename: LABPROT:2,INR:2 in the last 72 hours ABG No results found for this basename: PHART:2,PCO2:2,PO2:2,HCO3:2 in the last 72 hours  Studies/Results: Dg Chest 1 View  06/24/2011  *RADIOLOGY REPORT*  Clinical Data: Follow-up left pneumothorax following left renal ablation.  CHEST - 1 VIEW  Comparison: 06/24/2011  Findings: The previously visualized left pneumothorax is not visualized on this study. Mild left basilar atelectasis is again noted. The cardiomediastinal silhouette is unremarkable. There is no evidence of pleural effusion or pulmonary edema.  IMPRESSION: No pneumothorax identified.  Left basilar atelectasis.  Original Report Authenticated By: Rosendo Gros, M.D.   Ct Guide Tissue Ablation  06/24/2011  *RADIOLOGY REPORT*  Clinical Data:  Left renal mass.  The patient presents for biopsy and percutaneous cryoablation.  CT-GUIDED CORE BIOPSY OF LEFT RENAL MASS. CT-GUIDED PERCUTANEOUS CRYOABLATION OF LEFT RENAL MASS.  Anesthesia:  General  Medications:  1 gram IV vancomycin. Vancomycin was given within two hours of incision.  Vancomycin was given due to an antibiotic allergy.  Procedure:  The procedure, risks, benefits, and alternatives were  explained to the patient.  Questions regarding the procedure were encouraged and answered.  The patient understands and consents to the procedure.  The patient was placed under general anesthesia.  Initial unenhanced CT was performed in a prone position to localize the left renal mass.  The left flank region was prepped with Betadine in a sterile fashion, and a sterile drape was applied covering the operative field.  A sterile gown and sterile gloves were used for the procedure.  A 17 gauge trocar needle was advanced into the renal tumor.  Core biopsy was performed with an 18 gauge automated core biopsy device. A total of 2 core samples were submitted in formalin for pathologic analysis.  Hydrodissection was performed with installation of sterile saline into the retroperitoneal space via a 22 gauge spinal needle. During the procedure, several injections of saline were performed to displace the tumor away from the posterior rib margin as well as the descending colon.  A total of approximately 120 ml of saline was utilized during the procedure.  Under CT guidance, 2 Galil Ice Rod Plus percutaneous cryoablation probes were advanced into the left renal mass.  Probe positioning was confirmed by CT prior to cryoablation.  Cryoablation was performed through the two probes simultaneously. Initial 7 minute cycle of cryoablation was performed.  This was followed by a 4 minute thaw cycle.  A second 7 minute cycle of cryoablation was then performed.  During ablation, periodic CT imaging was performed to monitor ice ball formation and morphology. After active thaw, the cryoablation probes were removed.  Post-procedural CT was performed.  Complications: Small pneumothorax.  Findings:  Exophytic mass emanating from the posterior upper to  mid renal cortex measures approximately 1.3 x 1.8 cm by unenhanced CT. Biopsy resulted in solid tissue. During the procedure, it became evident that there was a small posterior sulcus pneumothorax  present due to the entry of one or more needles through the posterior pleural reflection.  This was stable during the procedure resulting in a very small pneumothorax by CT.  This will be followed after the procedure by chest x-ray.  Monitoring during cryoablation showed adequate ice ball formation encompassing the renal tumor.  There were no immediate hemorrhagic complications.  IMPRESSION:  CT guided percutaneous core biopsy and cryoablation of left renal mass.  The patient will be observed overnight.  The procedure was complicated by a very small posterior left pneumothorax.  This will be followed by chest x-ray.  Initial follow-up will be performed in approximately 4 weeks.  Original Report Authenticated By: Reola Calkins, M.D.   Ct Biopsy  06/24/2011  *RADIOLOGY REPORT*  Clinical Data:  Left renal mass.  The patient presents for biopsy and percutaneous cryoablation.  CT-GUIDED CORE BIOPSY OF LEFT RENAL MASS. CT-GUIDED PERCUTANEOUS CRYOABLATION OF LEFT RENAL MASS.  Anesthesia:  General  Medications:  1 gram IV vancomycin. Vancomycin was given within two hours of incision.  Vancomycin was given due to an antibiotic allergy.  Procedure:  The procedure, risks, benefits, and alternatives were explained to the patient.  Questions regarding the procedure were encouraged and answered.  The patient understands and consents to the procedure.  The patient was placed under general anesthesia.  Initial unenhanced CT was performed in a prone position to localize the left renal mass.  The left flank region was prepped with Betadine in a sterile fashion, and a sterile drape was applied covering the operative field.  A sterile gown and sterile gloves were used for the procedure.  A 17 gauge trocar needle was advanced into the renal tumor.  Core biopsy was performed with an 18 gauge automated core biopsy device. A total of 2 core samples were submitted in formalin for pathologic analysis.  Hydrodissection was performed  with installation of sterile saline into the retroperitoneal space via a 22 gauge spinal needle. During the procedure, several injections of saline were performed to displace the tumor away from the posterior rib margin as well as the descending colon.  A total of approximately 120 ml of saline was utilized during the procedure.  Under CT guidance, 2 Galil Ice Rod Plus percutaneous cryoablation probes were advanced into the left renal mass.  Probe positioning was confirmed by CT prior to cryoablation.  Cryoablation was performed through the two probes simultaneously. Initial 7 minute cycle of cryoablation was performed.  This was followed by a 4 minute thaw cycle.  A second 7 minute cycle of cryoablation was then performed.  During ablation, periodic CT imaging was performed to monitor ice ball formation and morphology. After active thaw, the cryoablation probes were removed.  Post-procedural CT was performed.  Complications: Small pneumothorax.  Findings:  Exophytic mass emanating from the posterior upper to mid renal cortex measures approximately 1.3 x 1.8 cm by unenhanced CT. Biopsy resulted in solid tissue. During the procedure, it became evident that there was a small posterior sulcus pneumothorax present due to the entry of one or more needles through the posterior pleural reflection.  This was stable during the procedure resulting in a very small pneumothorax by CT.  This will be followed after the procedure by chest x-ray.  Monitoring during cryoablation showed adequate ice ball formation  encompassing the renal tumor.  There were no immediate hemorrhagic complications.  IMPRESSION:  CT guided percutaneous core biopsy and cryoablation of left renal mass.  The patient will be observed overnight.  The procedure was complicated by a very small posterior left pneumothorax.  This will be followed by chest x-ray.  Initial follow-up will be performed in approximately 4 weeks.  Original Report Authenticated By: Reola Calkins, M.D.   Dg Chest Port 1 View  06/24/2011  *RADIOLOGY REPORT*  Clinical Data: Small left pneumothorax following left renal tumor cryoablation.  PORTABLE CHEST - 1 VIEW  Comparison: 04/22/2011  Findings: Semiupright radiograph obtained in PACU demonstrates a trace pneumothorax at the left lung apex of only 1-5% volume.  Mild atelectasis present at the left lung base.  No edema or focal consolidation.  Cardiac and mediastinal contours are within normal limits.  IMPRESSION: Tiny left apical pneumothorax visualized of 1-5% volume.  Original Report Authenticated By: Reola Calkins, M.D.    Anti-infectives: Anti-infectives    None      Assessment/Plan: s/p cryoablation of left renal tumor.  Mild pain.  Follow-up CXR shows no PTX on left.  Urine clear. Plan for discharge tomorrow  LOS: 0 days    Micah Galeno T 06/24/2011

## 2011-06-24 NOTE — H&P (Signed)
Agree 

## 2011-06-24 NOTE — Anesthesia Postprocedure Evaluation (Signed)
  Anesthesia Post-op Note  Patient: Dawn Marsh  Procedure(s) Performed: Procedure(s) (LRB): RADIO FREQUENCY ABLATION (Left)  Patient Location: PACU  Anesthesia Type: General  Level of Consciousness: oriented and sedated  Airway and Oxygen Therapy: Patient Spontanous Breathing and Patient connected to nasal cannula oxygen  Post-op Pain: mild  Post-op Assessment: Post-op Vital signs reviewed, Patient's Cardiovascular Status Stable, Respiratory Function Stable and Patent Airway  Post-op Vital Signs: stable  Complications: No apparent anesthesia complications

## 2011-06-24 NOTE — Procedures (Signed)
Procedure:  CT guided core biopsy and cryoablation of left renal mass Anesthesia:  General Findings:  Posterior exophytic mass sampled with 18 G core needle x 2 via 17 G needle.  Cryoablation with Galil Ice Rod Plus probes x 2.   Complications:  Small posterior basal PTX seen by CT.  Will follow by CXR.

## 2011-06-24 NOTE — Transfer of Care (Signed)
Immediate Anesthesia Transfer of Care Note  Patient: Dawn Marsh  Procedure(s) Performed: Procedure(s) (LRB): RADIO FREQUENCY ABLATION (Left)  Patient Location: PACU  Anesthesia Type: General  Level of Consciousness: awake, alert , oriented and patient cooperative  Airway & Oxygen Therapy: Patient Spontanous Breathing and Patient connected to face mask oxygen  Post-op Assessment: Report given to PACU RN and Post -op Vital signs reviewed and stable  Post vital signs: Reviewed and stable  Complications: No apparent anesthesia complications

## 2011-06-25 DIAGNOSIS — N2889 Other specified disorders of kidney and ureter: Secondary | ICD-10-CM | POA: Diagnosis present

## 2011-06-25 LAB — BASIC METABOLIC PANEL
CO2: 27 mEq/L (ref 19–32)
Chloride: 100 mEq/L (ref 96–112)
GFR calc Af Amer: >90 mL/min (ref >90)
Potassium: 3.3 mEq/L — ABNORMAL LOW (ref 3.5–5.1)
Sodium: 135 mEq/L (ref 135–145)

## 2011-06-25 LAB — CBC
HCT: 35.2 % — ABNORMAL LOW (ref 36.0–46.0)
MCV: 92.9 fL (ref 78.0–100.0)
RBC: 3.79 MIL/uL — ABNORMAL LOW (ref 3.87–5.11)
RDW: 12.6 % (ref 11.5–15.5)
WBC: 6.7 10*3/uL (ref 4.0–10.5)

## 2011-06-25 NOTE — Discharge Summary (Signed)
Physician Discharge Summary  Patient ID: Dawn Marsh MRN: [id number] DOB/AGE: 1966/01/13 46 y.o.  Admit date: 06/24/2011 Discharge date: 06/25/2011  Admission Diagnoses: Active Problems:  Renal mass, left  Discharge Diagnoses:  Active Problems:  Renal mass, left    Procedures: Procedure(s): CT guided (L)renal mass biopsy and cryoembolization  Discharged Condition: good  Hospital Course: HPI: Incidental finding of renal lesion . Patient seen in consult with Dr. Irish Lack in regards to needle core biopsy and cryoablation - please see full radiology report for details. Patient denies any new acute symptoms or changes in her medical history since consultation. Brought to the CT dept on 4/12 and underwent (L)renal mass bx and cryoembolization with general endotracheal anesthesia. She tolerated the procedure well and was admitted to floor iin stable condition. No significant post-op issues. She is voiding well, has minimal pain and is eating reg diet. She is stable for discharge. Have reviewed discharge instructions and follow up plan.  Consults: None   Discharge Exam: Blood pressure 104/69, pulse 64, temperature 98.8 F (37.1 C), temperature source Oral, resp. rate 18, height 5\' 5"  (1.651 m), weight 141 lb 12.1 oz (64.3 kg), SpO2 100.00%. Lungs: CTA without w/r/r  Heart: Regular  Abdomen: soft, ND, NT  (L)flank site c/d/i, no hematoma   Disposition: 01-Home or Self Care  Discharge Orders    Future Orders Please Complete By Expires   Diet general      Increase activity slowly      May shower / Bathe      No wound care      Call MD for:  temperature >100.4      Call MD for:  persistant nausea and vomiting      Call MD for:  severe uncontrolled pain      Call MD for:  redness, tenderness, or signs of infection (pain, swelling, redness, odor or green/yellow discharge around incision site)        Medication List  As of 06/25/2011  8:43 AM   TAKE these medications          diazepam 2 MG tablet   Commonly known as: VALIUM   Take 1 mg by mouth every 6 (six) hours as needed. For vertigo      eletriptan 20 MG tablet   Commonly known as: RELPAX   One tablet by mouth as needed for migraine headache.  If the headache improves and then returns, dose may be repeated after 2 hours have elapsed since first dose (do not exceed 80 mg per day). For migraine may repeat in 2 hours if necessary      hydrochlorothiazide 25 MG tablet   Commonly known as: HYDRODIURIL   Take 25 mg by mouth at bedtime.      loratadine 10 MG tablet   Commonly known as: CLARITIN   Take 10 mg by mouth daily as needed. Allergies        polyethylene glycol powder powder   Commonly known as: GLYCOLAX/MIRALAX   Take 17 g by mouth every three (3) days as needed.      potassium chloride SA 20 MEQ tablet   Commonly known as: K-DUR,KLOR-CON   Take 20 mEq by mouth daily.           Follow-up Information    Follow up with [facility] T, MD in 4 weeks. (Office will call with appointment date and time.)    Contact information:   [facility] [phone]  SignedBrayton El PA-C 06/25/2011, 8:43 AM

## 2011-06-25 NOTE — Progress Notes (Signed)
1 Day Post-Op  Subjective: Pt looks and feels good. Mild pains, no SOB. Tol diet Voiding well on own  Objective: Vital signs in last 24 hours: Temp:  [96.9 F (36.1 C)-98.8 F (37.1 C)] 98.8 F (37.1 C) (04/13 0544) Pulse Rate:  [62-90] 64  (04/13 0544) Resp:  [13-19] 18  (04/13 0544) BP: (98-146)/(67-83) 104/69 mmHg (04/13 0544) SpO2:  [98 %-100 %] 100 % (04/13 0544) Weight:  [141 lb 12.1 oz (64.3 kg)] 141 lb 12.1 oz (64.3 kg) (04/12 1327) Last BM Date: 06/23/11  Intake/Output this shift:    Physical Exam: BP 104/69  Pulse 64  Temp(Src) 98.8 F (37.1 C) (Oral)  Resp 18  Ht 5\' 5"  (1.651 m)  Wt 141 lb 12.1 oz (64.3 kg)  BMI 23.59 kg/m2  SpO2 100% Lungs: CTA without w/r/r Heart: Regular Abdomen: soft, ND, NT (L)flank site c/d/i, no hematoma    Labs: CBC  Basename 06/25/11 0430  WBC 6.7  HGB 12.1  HCT 35.2*  PLT 286   BMET  Basename 06/25/11 0430  NA 135  K 3.3*  CL 100  CO2 27  GLUCOSE 99  BUN 14  CREATININE 0.82  CALCIUM 9.3   LFT No results found for this basename: PROT,ALBUMIN,AST,ALT,ALKPHOS,BILITOT,BILIDIR,IBILI,LIPASE in the last 72 hours PT/INR No results found for this basename: LABPROT:2,INR:2 in the last 72 hours ABG No results found for this basename: PHART:2,PCO2:2,PO2:2,HCO3:2 in the last 72 hours  Studies/Results: Dg Chest 1 View  06/24/2011  *RADIOLOGY REPORT*  Clinical Data: Follow-up left pneumothorax following left renal ablation.  CHEST - 1 VIEW  Comparison: 06/24/2011  Findings: The previously visualized left pneumothorax is not visualized on this study. Mild left basilar atelectasis is again noted. The cardiomediastinal silhouette is unremarkable. There is no evidence of pleural effusion or pulmonary edema.  IMPRESSION: No pneumothorax identified.  Left basilar atelectasis.  Original Report Authenticated By: Rosendo Gros, M.D.   Ct Guide Tissue Ablation  06/24/2011  *RADIOLOGY REPORT*  Clinical Data:  Left renal mass.  The  patient presents for biopsy and percutaneous cryoablation.  CT-GUIDED CORE BIOPSY OF LEFT RENAL MASS. CT-GUIDED PERCUTANEOUS CRYOABLATION OF LEFT RENAL MASS.  Anesthesia:  General  Medications:  1 gram IV vancomycin. Vancomycin was given within two hours of incision.  Vancomycin was given due to an antibiotic allergy.  Procedure:  The procedure, risks, benefits, and alternatives were explained to the patient.  Questions regarding the procedure were encouraged and answered.  The patient understands and consents to the procedure.  The patient was placed under general anesthesia.  Initial unenhanced CT was performed in a prone position to localize the left renal mass.  The left flank region was prepped with Betadine in a sterile fashion, and a sterile drape was applied covering the operative field.  A sterile gown and sterile gloves were used for the procedure.  A 17 gauge trocar needle was advanced into the renal tumor.  Core biopsy was performed with an 18 gauge automated core biopsy device. A total of 2 core samples were submitted in formalin for pathologic analysis.  Hydrodissection was performed with installation of sterile saline into the retroperitoneal space via a 22 gauge spinal needle. During the procedure, several injections of saline were performed to displace the tumor away from the posterior rib margin as well as the descending colon.  A total of approximately 120 ml of saline was utilized during the procedure.  Under CT guidance, 2 Galil Ice Rod Plus percutaneous cryoablation probes were advanced  into the left renal mass.  Probe positioning was confirmed by CT prior to cryoablation.  Cryoablation was performed through the two probes simultaneously. Initial 7 minute cycle of cryoablation was performed.  This was followed by a 4 minute thaw cycle.  A second 7 minute cycle of cryoablation was then performed.  During ablation, periodic CT imaging was performed to monitor ice ball formation and morphology.  After active thaw, the cryoablation probes were removed.  Post-procedural CT was performed.  Complications: Small pneumothorax.  Findings:  Exophytic mass emanating from the posterior upper to mid renal cortex measures approximately 1.3 x 1.8 cm by unenhanced CT. Biopsy resulted in solid tissue. During the procedure, it became evident that there was a small posterior sulcus pneumothorax present due to the entry of one or more needles through the posterior pleural reflection.  This was stable during the procedure resulting in a very small pneumothorax by CT.  This will be followed after the procedure by chest x-ray.  Monitoring during cryoablation showed adequate ice ball formation encompassing the renal tumor.  There were no immediate hemorrhagic complications.  IMPRESSION:  CT guided percutaneous core biopsy and cryoablation of left renal mass.  The patient will be observed overnight.  The procedure was complicated by a very small posterior left pneumothorax.  This will be followed by chest x-ray.  Initial follow-up will be performed in approximately 4 weeks.  Original Report Authenticated By: Reola Calkins, M.D.   Ct Biopsy  06/24/2011  *RADIOLOGY REPORT*  Clinical Data:  Left renal mass.  The patient presents for biopsy and percutaneous cryoablation.  CT-GUIDED CORE BIOPSY OF LEFT RENAL MASS. CT-GUIDED PERCUTANEOUS CRYOABLATION OF LEFT RENAL MASS.  Anesthesia:  General  Medications:  1 gram IV vancomycin. Vancomycin was given within two hours of incision.  Vancomycin was given due to an antibiotic allergy.  Procedure:  The procedure, risks, benefits, and alternatives were explained to the patient.  Questions regarding the procedure were encouraged and answered.  The patient understands and consents to the procedure.  The patient was placed under general anesthesia.  Initial unenhanced CT was performed in a prone position to localize the left renal mass.  The left flank region was prepped with Betadine in a  sterile fashion, and a sterile drape was applied covering the operative field.  A sterile gown and sterile gloves were used for the procedure.  A 17 gauge trocar needle was advanced into the renal tumor.  Core biopsy was performed with an 18 gauge automated core biopsy device. A total of 2 core samples were submitted in formalin for pathologic analysis.  Hydrodissection was performed with installation of sterile saline into the retroperitoneal space via a 22 gauge spinal needle. During the procedure, several injections of saline were performed to displace the tumor away from the posterior rib margin as well as the descending colon.  A total of approximately 120 ml of saline was utilized during the procedure.  Under CT guidance, 2 Galil Ice Rod Plus percutaneous cryoablation probes were advanced into the left renal mass.  Probe positioning was confirmed by CT prior to cryoablation.  Cryoablation was performed through the two probes simultaneously. Initial 7 minute cycle of cryoablation was performed.  This was followed by a 4 minute thaw cycle.  A second 7 minute cycle of cryoablation was then performed.  During ablation, periodic CT imaging was performed to monitor ice ball formation and morphology. After active thaw, the cryoablation probes were removed.  Post-procedural CT was performed.  Complications: Small pneumothorax.  Findings:  Exophytic mass emanating from the posterior upper to mid renal cortex measures approximately 1.3 x 1.8 cm by unenhanced CT. Biopsy resulted in solid tissue. During the procedure, it became evident that there was a small posterior sulcus pneumothorax present due to the entry of one or more needles through the posterior pleural reflection.  This was stable during the procedure resulting in a very small pneumothorax by CT.  This will be followed after the procedure by chest x-ray.  Monitoring during cryoablation showed adequate ice ball formation encompassing the renal tumor.  There  were no immediate hemorrhagic complications.  IMPRESSION:  CT guided percutaneous core biopsy and cryoablation of left renal mass.  The patient will be observed overnight.  The procedure was complicated by a very small posterior left pneumothorax.  This will be followed by chest x-ray.  Initial follow-up will be performed in approximately 4 weeks.  Original Report Authenticated By: Reola Calkins, M.D.   Dg Chest Port 1 View  06/24/2011  *RADIOLOGY REPORT*  Clinical Data: Small left pneumothorax following left renal tumor cryoablation.  PORTABLE CHEST - 1 VIEW  Comparison: 04/22/2011  Findings: Semiupright radiograph obtained in PACU demonstrates a trace pneumothorax at the left lung apex of only 1-5% volume.  Mild atelectasis present at the left lung base.  No edema or focal consolidation.  Cardiac and mediastinal contours are within normal limits.  IMPRESSION: Tiny left apical pneumothorax visualized of 1-5% volume.  Original Report Authenticated By: Reola Calkins, M.D.    Assessment/Plan: S/p bx of cryoembolization of (L)renal mass. Doing well Will DC this am.    LOS: 1 day    Brayton El PA-C 06/25/2011 8:37 AM

## 2011-06-25 NOTE — Progress Notes (Signed)
All discharge instructions reviewed with patient. Patient expressed understanding. Will d/c home with mother, in stable condition.

## 2011-06-27 ENCOUNTER — Other Ambulatory Visit: Payer: Self-pay | Admitting: Emergency Medicine

## 2011-06-27 ENCOUNTER — Other Ambulatory Visit: Payer: Self-pay | Admitting: Interventional Radiology

## 2011-06-27 ENCOUNTER — Other Ambulatory Visit (HOSPITAL_COMMUNITY): Payer: Self-pay | Admitting: Interventional Radiology

## 2011-06-27 DIAGNOSIS — N2889 Other specified disorders of kidney and ureter: Secondary | ICD-10-CM

## 2011-07-05 ENCOUNTER — Telehealth (HOSPITAL_COMMUNITY): Payer: Self-pay | Admitting: Interventional Radiology

## 2011-07-05 NOTE — Telephone Encounter (Signed)
Called patient with renal biopsy results.  She has complaints of night sweats last few nights but no fever.  She will contact office if persistent.  Follow up scheduled in 2 weeks.

## 2011-08-02 ENCOUNTER — Ambulatory Visit (HOSPITAL_COMMUNITY)
Admission: RE | Admit: 2011-08-02 | Discharge: 2011-08-02 | Disposition: A | Discharge status: Home / Self Care | Payer: 59 | Source: Ambulatory Visit | Attending: Interventional Radiology | Admitting: Interventional Radiology

## 2011-08-02 ENCOUNTER — Ambulatory Visit
Admission: RE | Admit: 2011-08-02 | Discharge: 2011-08-02 | Disposition: A | Discharge status: Home / Self Care | Payer: 59 | Source: Ambulatory Visit | Attending: Interventional Radiology | Admitting: Interventional Radiology

## 2011-08-02 DIAGNOSIS — C649 Malignant neoplasm of unspecified kidney, except renal pelvis: Secondary | ICD-10-CM | POA: Insufficient documentation

## 2011-08-02 DIAGNOSIS — N2889 Other specified disorders of kidney and ureter: Secondary | ICD-10-CM

## 2011-08-02 DIAGNOSIS — K7689 Other specified diseases of liver: Secondary | ICD-10-CM | POA: Insufficient documentation

## 2011-08-02 DIAGNOSIS — D1803 Hemangioma of intra-abdominal structures: Secondary | ICD-10-CM | POA: Insufficient documentation

## 2011-08-02 DIAGNOSIS — K5900 Constipation, unspecified: Secondary | ICD-10-CM | POA: Insufficient documentation

## 2011-08-02 DIAGNOSIS — R0789 Other chest pain: Secondary | ICD-10-CM | POA: Insufficient documentation

## 2011-08-02 DIAGNOSIS — I517 Cardiomegaly: Secondary | ICD-10-CM | POA: Insufficient documentation

## 2011-08-02 MED ORDER — IOHEXOL 300 MG/ML  SOLN
100.0000 mL | Freq: Once | INTRAMUSCULAR | Status: AC | PRN
  Administered 2011-08-02: 100 mL via INTRAVENOUS

## 2011-08-02 NOTE — Progress Notes (Signed)
Denies hematuria or any other problems w/ urination.  Denies pain associated w/ cryoablation.  Returned to work 07-04-2011.    States that she has experienced occasional hot flashes &/or night sweats post procedure.

## 2012-06-28 ENCOUNTER — Other Ambulatory Visit (HOSPITAL_COMMUNITY): Payer: Self-pay | Admitting: Interventional Radiology

## 2012-06-28 DIAGNOSIS — N2889 Other specified disorders of kidney and ureter: Secondary | ICD-10-CM

## 2012-07-02 ENCOUNTER — Telehealth: Payer: Self-pay | Admitting: Hematology & Oncology

## 2012-07-02 NOTE — Telephone Encounter (Signed)
Per MD called [facility] talked with Marcelino Duster and faxed referral to her. They will contact pt to schedule appointment.

## 2012-07-09 ENCOUNTER — Telehealth: Payer: Self-pay | Admitting: Hematology & Oncology

## 2012-07-09 NOTE — Telephone Encounter (Signed)
Received another referral from [facility]. I called Marcelino Duster at [facility] she said they did not receive referral from [place]. I faxed it back and they received it and will contact Pt for appointment

## 2012-07-12 ENCOUNTER — Encounter: Payer: Self-pay | Admitting: Radiology

## 2012-07-24 ENCOUNTER — Ambulatory Visit (HOSPITAL_COMMUNITY): Payer: 59 | Admitting: Oncology

## 2012-07-30 ENCOUNTER — Encounter (HOSPITAL_COMMUNITY): Payer: Self-pay

## 2012-07-31 ENCOUNTER — Encounter: Payer: Self-pay | Admitting: Radiology

## 2012-08-31 NOTE — Telephone Encounter (Signed)
e

## 2012-11-09 ENCOUNTER — Other Ambulatory Visit (HOSPITAL_COMMUNITY): Payer: Self-pay | Admitting: Rheumatology

## 2012-11-09 ENCOUNTER — Ambulatory Visit (HOSPITAL_COMMUNITY)
Admission: RE | Admit: 2012-11-09 | Discharge: 2012-11-09 | Disposition: A | Discharge status: Home / Self Care | Payer: No Typology Code available for payment source | Source: Ambulatory Visit | Attending: Rheumatology | Admitting: Rheumatology

## 2012-11-09 DIAGNOSIS — R0789 Other chest pain: Secondary | ICD-10-CM | POA: Insufficient documentation

## 2012-11-09 DIAGNOSIS — R52 Pain, unspecified: Secondary | ICD-10-CM

## 2012-12-03 ENCOUNTER — Encounter: Payer: Self-pay | Admitting: Neurology

## 2012-12-03 ENCOUNTER — Ambulatory Visit (INDEPENDENT_AMBULATORY_CARE_PROVIDER_SITE_OTHER): Payer: No Typology Code available for payment source | Admitting: Neurology

## 2012-12-03 VITALS — BP 126/89 | HR 83 | Temp 97.6°F | Ht 64.0 in | Wt 147.0 lb

## 2012-12-03 DIAGNOSIS — R519 Headache, unspecified: Secondary | ICD-10-CM

## 2012-12-03 DIAGNOSIS — R51 Headache: Secondary | ICD-10-CM

## 2012-12-03 DIAGNOSIS — M329 Systemic lupus erythematosus, unspecified: Secondary | ICD-10-CM

## 2012-12-03 DIAGNOSIS — G43009 Migraine without aura, not intractable, without status migrainosus: Secondary | ICD-10-CM

## 2012-12-03 NOTE — Patient Instructions (Addendum)
I think overall you are doing fairly well but I do want to suggest a few things today:  Remember to drink plenty of fluid, eat healthy meals and do not skip any meals. Try to eat protein with a every meal and eat a healthy snack such as fruit or nuts in between meals. Try to keep a regular sleep-wake schedule and try to exercise daily, particularly in the form of walking, 20-30 minutes a day, if you can.   Please remember, common headache triggers are: sleep deprivation, dehydration, overheating, stress, hypoglycemia or skipping meals and blood sugar fluctuations, excessive pain medications or excessive alcohol use or caffeine withdrawal. Some people have food triggers such as aged cheese, orange juice or chocolate, especially dark chocolate, or MSG (monosodium glutamate). Try to avoid these headache triggers as much possible. It may be helpful to keep a headache diary to figure out what makes your headaches worse or brings them on and what alleviates them. Some people report headache onset after exercise but studies have shown that regular exercise may actually prevent headaches from coming. If you have exercise-induced headaches, please make sure that you drink plenty of fluid before and after exercising and that you do not over do it and do not overheat.  As far as your medications are concerned, I would like to suggest no change, continue with as needed use of relpax low dose.   As far as diagnostic testing: MRI brain and MRA head to look at your brain structure and brain blood vessels.  Please ask family and friends or your significant other or bed partner if you snore and if so, how loud it is, and if you have breathing related issues in your sleep, such as: snorting sounds, choking sounds, pauses in your breathing or shallow breathing events. These may be symptoms of obstructive sleep apnea (OSA).   I would like to see you back in 3 months, sooner if we need to. Please call us with any interim  questions, concerns, problems, updates or refill requests.  Please also call us for any test results so we can go over those with you on the phone. Brett Canales is my clinical assistant and will answer any of your questions and relay your messages to me and also relay most of my messages to you.  Our phone number is [phone]. We also have an after hours call service for urgent matters and there is a physician on-call for urgent questions. For any emergencies you know to call 911 or go to the nearest emergency room.

## 2012-12-03 NOTE — Progress Notes (Signed)
Subjective:    Patient ID: Dawn Marsh is a 47 y.o. female.  HPI  Huston Foley, MD, PhD [facility] [address], [address] [address] [place], [place] [address]  Dear Dr. Janalyn Rouse,   I saw your patient, Dawn Marsh, upon your kind request in my neurologic clinic today for initial consultation of her recurrent headaches. The patient is unaccompanied today. As you know, Dawn Marsh is a very pleasant 47 year old right-handed woman with an underlying medical history of SLE who was previously diagnosed with migraine headaches in 2010. She saw Dr. Stacie Acres in [facility] a few times. Her HAs date back to several years ago, but more severe HAs started in 2010. She describes a one sided tightness sensation with periorbital pain and preceded by nausea, even a day or 2 before. She is bothered by bright light and has nausea. She was labeled with migraine. She has had some weakness with HA. She has visual blurriness with the HA. She has had associated neck soreness and the pain can set off her vertigo. She feels like she is swaying. She has been on Relpax, but takes only 1/2 of a 20 mg, as the higher dose made her sick. Her HA lasts for 24 hours, rarely longer. In the past, she has had a HA for 1-2 weeks. She is on MTX 15 mg once a week. She has been able to tolerate it.  She endorses some memory loss. She has not been on any preventative medication. Her HA frequency is very erratic. She had a HE in 2011 and had R oophorectomy in 2010 for endometriosis. She has no FHx of migraines. She denies snoring, but has woken up with a gasp. She has had morning HAs. She does not sleep well.  The patient denies prior TIA or stroke symptoms, such as sudden onset of one sided weakness, numbness, tingling, slurring of speech or droopy face, hearing loss, tinnitus, diplopia or visual field cut or monocular loss of vision, but has had subjective weakness during a HA.   Her Past Medical History Is  Significant For: Past Medical History  Diagnosis Date  . Vertigo   . Left renal mass   . Hypertension   . Goiter     sometimes trouble swallowing  . Chest pain     constant-ongoing for years--sometimes eases up--but always comes back  . Heart murmur   . GERD (gastroesophageal reflux disease)     burning in stomach and gas--no med  . Seizures     migraines  . Migraine   . Anxiety   . Rhinitis, allergic     seasonal    Her Past Surgical History Is Significant For: Past Surgical History  Procedure Laterality Date  . Myomectomy x 2 (prior to the hysterectomy)  2002 and 2006  . Abdominal hysterectomy  2011    Her Family History Is Significant For: No family history on file.  Her Social History Is Significant For: History   Social History  . Marital Status: Divorced    Spouse Name: N/A    Number of Children: N/A  . Years of Education: N/A   Social History Main Topics  . Smoking status: Never Smoker   . Smokeless tobacco: Never Used  . Alcohol Use: Yes     Comment: occas-maybe once a month  . Drug Use: No  . Sexual Activity:    Other Topics Concern  . None   Social History Narrative  . None  Her Allergies Are:  Allergies  Allergen Reactions  . Amoxil [Amoxicillin Trihydrate] Hives and Diarrhea  . Penicillins Hives  . Latex Rash  :   Her Current Medications Are:  Outpatient Encounter Prescriptions as of 12/03/2012  Medication Sig Dispense Refill  . diazepam (VALIUM) 2 MG tablet Take 1 mg by mouth every 6 (six) hours as needed. For vertigo      . eletriptan (RELPAX) 20 MG tablet One tablet by mouth as needed for migraine headache.  If the headache improves and then returns, dose may be repeated after 2 hours have elapsed since first dose (do not exceed 80 mg per day). For migraine may repeat in 2 hours if necessary      . ergocalciferol (VITAMIN D2) 50000 UNITS capsule Take 50,000 Units by mouth once a week.      Marland Kitchen FOLIC ACID PO Take 2 tablets by mouth  daily.      . hydrochlorothiazide (HYDRODIURIL) 25 MG tablet Take 25 mg by mouth at bedtime.       Marland Kitchen loratadine (CLARITIN) 10 MG tablet Take 10 mg by mouth daily as needed. Allergies       . methotrexate (RHEUMATREX) 15 MG tablet Take 15 mg by mouth once a week. Caution: Chemotherapy. Protect from light.      . polyethylene glycol powder (GLYCOLAX/MIRALAX) powder Take 17 g by mouth every three (3) days as needed.      . potassium chloride SA (K-DUR,KLOR-CON) 20 MEQ tablet Take 20 mEq by mouth daily.        No facility-administered encounter medications on file as of 12/03/2012.  :  Review of Systems:  Out of a complete 14 point review of systems, all are reviewed and negative with the exception of these symptoms as listed below: Review of Systems  Constitutional: Positive for fatigue.  Eyes: Positive for visual disturbance (blurred vision).       1 year since last eye exam  Cardiovascular:       Murmur  Endocrine: Positive for heat intolerance.  Musculoskeletal: Positive for myalgias, joint swelling and arthralgias.  Neurological: Positive for weakness and headaches.       Memory loss  Psychiatric/Behavioral: Positive for sleep disturbance. The patient is nervous/anxious.     Objective:  Neurologic Exam  Physical Exam Physical Examination:   Filed Vitals:   12/03/12 1420  BP: 126/89  Pulse: 83  Temp: 97.6 F (36.4 C)    General Examination: The patient is a very pleasant 47 y.o. female in no acute distress. She appears well-developed and well-nourished and very well groomed.   HEENT: Normocephalic, atraumatic, pupils are equal, round and reactive to light and accommodation. Funduscopic exam is normal with sharp disc margins noted. Extraocular tracking is good without limitation to gaze excursion or nystagmus noted. Normal smooth pursuit is noted. Hearing is grossly intact. Tympanic membranes are clear bilaterally. Face is symmetric with normal facial animation and normal  facial sensation. Speech is clear with no dysarthria noted. There is no hypophonia. There is no lip, neck/head, jaw or voice tremor. Neck is supple with full range of passive and active motion. There are no carotid bruits on auscultation. Oropharynx exam reveals: mild mouth dryness, adequate dental hygiene and mild airway crowding, due to elongated. Mallampati is class II. Tongue protrudes centrally and palate elevates symmetrically. Tonsils are 1+.  Chest: Clear to auscultation without wheezing, rhonchi or crackles noted.  Heart: S1+S2+0, regular and normal without murmurs, rubs or gallops noted.   Abdomen:  Soft, non-tender and non-distended with normal bowel sounds appreciated on auscultation.  Extremities: There is no pitting edema in the distal lower extremities bilaterally. Pedal pulses are intact.  Skin: Warm and dry without trophic changes noted. There are no varicose veins.  Musculoskeletal: exam reveals no obvious joint deformities, tenderness or joint swelling or erythema.   Neurologically:  Mental status: The patient is awake, alert and oriented in all 4 spheres. Her memory, attention, language and knowledge are appropriate. There is no aphasia, agnosia, apraxia or anomia. Speech is clear with normal prosody and enunciation. Thought process is linear. Mood is congruent and affect is normal.  Cranial nerves are as described above under HEENT exam. In addition, shoulder shrug is normal with equal shoulder height noted. Motor exam: Normal bulk, strength and tone is noted. There is no drift, tremor or rebound. Romberg is negative. Reflexes are 2+ throughout. Toes are downgoing bilaterally. Fine motor skills are intact with normal finger taps, normal hand movements, normal rapid alternating patting, normal foot taps and normal foot agility.  Cerebellar testing shows no dysmetria or intention tremor on finger to nose testing. Heel to shin is unremarkable bilaterally. There is no truncal or  gait ataxia.  Sensory exam is intact to light touch, pinprick, vibration, temperature sense and proprioception in the upper and lower extremities.  Gait, station and balance are unremarkable. No veering to one side is noted. No leaning to one side is noted. Posture is age-appropriate and stance is narrow based. No problems turning are noted. She turns en bloc. Tandem walk is unremarkable. Intact toe and heel stance is noted.               Assessment and Plan:   In summary, Dawn Marsh is a very pleasant 47 y.o.-year old female with a history of lupus and a prior Dx of migraines. I think she has a history of migraines without aura but also possible non-migrainous headaches, potential potential-type headaches associated with neck soreness. She is at risk for CNS vasculitis given her history of lupus. Thankfully, her physical exam is non-focal. She is doing fairly well at this time and I reassured the patient in that regard.  I had a long chat with the patient about my findings and the diagnosis of migraine headaches, tension type headaches and lupus-related complications. I think her headache frequency is infrequent enough that this time that she can get away with as needed medication. I have asked her to continue to take Relpax as needed. She has been using it cautiously. We talked about preventative medications which may be something she needs down the Road. I would like to proceed with a brain MRI with and without contrast as well as brain MRA given her history of recurrent headaches as well as lupus. I encouraged the patient to eat healthy, exercise daily and keep well hydrated, to keep a scheduled bedtime and wake time routine, to not skip any meals and eat healthy snacks in between meals and to have protein with every meal.   I advised the patient about common headache triggers: sleep deprivation, dehydration, overheating, stress, hypoglycemia or skipping meals and blood sugar fluctuations,  excessive pain medications or excessive alcohol use or caffeine withdrawal. Some people have food triggers such as aged cheese, orange juice or chocolate, especially dark chocolate, or MSG (monosodium glutamate). She is to try to avoid these headache triggers as much possible. It may be helpful to keep a headache diary to figure out what makes Her  headaches worse or brings them on and what alleviates them. Some people report headache onset after exercise but studies have shown that regular exercise may actually prevent headaches from coming. If She has exercise-induced headaches, She is advised to drink plenty of fluid before and after exercising and that to not overdo it and to not overheat.  As far as further diagnostic testing is concerned, I suggested the following today: MRA of brain w/o Gad and MRI brain w/wo contrast.  She is advised to ask her friends and family to watch for signs of OSA. She does endorse waking up gasping sometimes. She has a mildly crowded airway.  I answered all her questions today and the patient was in agreement with the above outlined plan. I would like to see the patient back in 3 months, sooner if the need arises and encouraged her to call with any interim questions, concerns, problems or updates and test results.   Thank you very much for allowing me to participate in the care of this nice patient. If I can be of any further assistance to you please do not hesitate to call me at [phone].  Sincerely,   Huston Foley, MD, PhD

## 2012-12-07 ENCOUNTER — Telehealth: Payer: Self-pay | Admitting: Hematology and Oncology

## 2012-12-07 NOTE — Telephone Encounter (Signed)
LEFT PT VM IN REF TO NP APPT. °

## 2012-12-11 ENCOUNTER — Telehealth: Payer: Self-pay | Admitting: Hematology and Oncology

## 2012-12-11 NOTE — Telephone Encounter (Signed)
C/D 12/11/12 for appt. 12/27/12

## 2012-12-20 ENCOUNTER — Other Ambulatory Visit: Payer: No Typology Code available for payment source

## 2012-12-26 ENCOUNTER — Other Ambulatory Visit: Payer: No Typology Code available for payment source

## 2012-12-27 ENCOUNTER — Other Ambulatory Visit (HOSPITAL_COMMUNITY)
Admission: RE | Admit: 2012-12-27 | Discharge: 2012-12-27 | Disposition: A | Discharge status: Home / Self Care | Payer: No Typology Code available for payment source | Source: Ambulatory Visit | Attending: Hematology and Oncology | Admitting: Hematology and Oncology

## 2012-12-27 ENCOUNTER — Encounter: Payer: Self-pay | Admitting: Hematology and Oncology

## 2012-12-27 ENCOUNTER — Ambulatory Visit (HOSPITAL_BASED_OUTPATIENT_CLINIC_OR_DEPARTMENT_OTHER): Payer: No Typology Code available for payment source | Admitting: Lab

## 2012-12-27 ENCOUNTER — Ambulatory Visit (HOSPITAL_BASED_OUTPATIENT_CLINIC_OR_DEPARTMENT_OTHER): Payer: No Typology Code available for payment source

## 2012-12-27 ENCOUNTER — Ambulatory Visit (HOSPITAL_BASED_OUTPATIENT_CLINIC_OR_DEPARTMENT_OTHER): Payer: No Typology Code available for payment source | Admitting: Hematology and Oncology

## 2012-12-27 ENCOUNTER — Other Ambulatory Visit: Payer: Self-pay | Admitting: Hematology and Oncology

## 2012-12-27 ENCOUNTER — Telehealth: Payer: Self-pay | Admitting: Hematology and Oncology

## 2012-12-27 VITALS — BP 121/83 | HR 84 | Temp 97.0°F | Resp 19 | Ht 64.0 in | Wt 147.4 lb

## 2012-12-27 DIAGNOSIS — N289 Disorder of kidney and ureter, unspecified: Secondary | ICD-10-CM

## 2012-12-27 DIAGNOSIS — M329 Systemic lupus erythematosus, unspecified: Secondary | ICD-10-CM

## 2012-12-27 DIAGNOSIS — D72819 Decreased white blood cell count, unspecified: Secondary | ICD-10-CM | POA: Insufficient documentation

## 2012-12-27 DIAGNOSIS — E049 Nontoxic goiter, unspecified: Secondary | ICD-10-CM

## 2012-12-27 DIAGNOSIS — D892 Hypergammaglobulinemia, unspecified: Secondary | ICD-10-CM

## 2012-12-27 DIAGNOSIS — D7589 Other specified diseases of blood and blood-forming organs: Secondary | ICD-10-CM

## 2012-12-27 DIAGNOSIS — D890 Polyclonal hypergammaglobulinemia: Secondary | ICD-10-CM

## 2012-12-27 DIAGNOSIS — D638 Anemia in other chronic diseases classified elsewhere: Secondary | ICD-10-CM

## 2012-12-27 DIAGNOSIS — E040 Nontoxic diffuse goiter: Secondary | ICD-10-CM

## 2012-12-27 HISTORY — DX: Decreased white blood cell count, unspecified: D72.819

## 2012-12-27 HISTORY — DX: Nontoxic diffuse goiter: E04.0

## 2012-12-27 HISTORY — DX: Anemia in other chronic diseases classified elsewhere: D63.8

## 2012-12-27 HISTORY — DX: Systemic lupus erythematosus, unspecified: M32.9

## 2012-12-27 HISTORY — DX: Hypergammaglobulinemia, unspecified: D89.2

## 2012-12-27 LAB — CBC WITH DIFFERENTIAL/PLATELET
BASO%: 0.3 % (ref 0.0–2.0)
Basophils Absolute: 0 10*3/uL (ref 0.0–0.1)
EOS%: 1.5 % (ref 0.0–7.0)
Eosinophils Absolute: 0.1 10*3/uL (ref 0.0–0.5)
HGB: 11.1 g/dL — ABNORMAL LOW (ref 11.6–15.9)
LYMPH%: 48.5 % (ref 14.0–49.7)
MCH: 30.9 pg (ref 25.1–34.0)
MCV: 92.5 fL (ref 79.5–101.0)
MONO%: 9.1 % (ref 0.0–14.0)
NEUT#: 1.4 10*3/uL — ABNORMAL LOW (ref 1.5–6.5)
RBC: 3.59 10*6/uL — ABNORMAL LOW (ref 3.70–5.45)
RDW: 13.5 % (ref 11.2–14.5)
lymph#: 1.7 10*3/uL (ref 0.9–3.3)
nRBC: 0 % (ref 0–0)

## 2012-12-27 LAB — IRON AND TIBC CHCC
%SAT: 36 % (ref 21–57)
Iron: 105 ug/dL (ref 41–142)
TIBC: 290 ug/dL (ref 236–444)
UIBC: 185 ug/dL (ref 120–384)

## 2012-12-27 LAB — FERRITIN CHCC: Ferritin: 256 ng/ml (ref 9–269)

## 2012-12-27 NOTE — Telephone Encounter (Signed)
gv and printed appt sched and avs for pt for NOV...sent pt to the lab   °

## 2012-12-27 NOTE — Progress Notes (Signed)
[facility] CONSULT NOTE  Patient Care Team: Corrie Mckusick, MD as PCP - General (Family Medicine)  CHIEF COMPLAINTS/PURPOSE OF CONSULTATION:  Elevated immunoglobulins level, on background history of systemic lupus  HISTORY OF PRESENTING ILLNESS:  Dawn Marsh 47 y.o. female is here because of elevated immunoglobulin levels. This patient was diagnosed with systemic lupus approximately 4 months ago. According to the patient, she has been feeling sick for at least 4 years. She had profound fatigue, poor memory, chronic headaches and occasional sensation of passing out. She is a complaint of butterfly rash around her face as well as joint pain. After extensive workup, she was subsequently found to have systemic lupus. Due to presence of G6PD, she was told she should not take any sulfa medications. She was never given a trial of prednisone and started on to methotrexate injection on a weekly basis to treat her lupus. She felt that the injections are helping with her rash and joint pain. She have routine blood work done on 11/28/2012 show a white count of 3.9, hemoglobin 11.6, with normal platelet count 264. Comprehensive metabolic panel review elevated alkaline phosphatase at 176 as well as elevated total protein of 8.4. Serum immunoglobulins came back high with an IgG level of 2490 and it IgA level of 408. Serum protein electrophoresis revealed polyclonal type increase in gammaglobulins. Due to elevated immunoglobulin levels she's been referred here. Of note, her last CBC from April 2013 was normal She denies any recent infection. No recent fevers, chills, or diarrhea. With the anemia, she still felt persistent fatigue. She denies any recent bleeding such as epistaxis, hematuria, or hematochezia. Interestingly, the patient also had background history of kidney lesion and had extensive workup on this. Multiple biopsies, on review of her chart, was negative. She had ablation therapy to  the kidney. She was also found to have multiple cysts in the liver. The patient had background history of anemia due to uterine fibroids and had hysterectomy. She had not had periods for a long time. She has never donated blood before and never received blood transfusions. She also had history of thyroid goiter but has not had a thyroid function test done for a while  MEDICAL HISTORY:  Past Medical History  Diagnosis Date  . Vertigo   . Left renal mass   . Hypertension   . Goiter     sometimes trouble swallowing  . Chest pain     constant-ongoing for years--sometimes eases up--but always comes back  . Heart murmur   . GERD (gastroesophageal reflux disease)     burning in stomach and gas--no med  . Seizures     migraines  . Migraine   . Anxiety   . Rhinitis, allergic     seasonal  . SLE (systemic lupus erythematosus) 12/27/2012  . Nontoxic simple goitre 12/27/2012  . 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 Leukopenia 12/27/2012  . Anemia of other chronic disease 12/27/2012    SURGICAL HISTORY: Past Surgical History  Procedure Laterality Date  . Myomectomy x 2 (prior to the hysterectomy)  2002 and 2006  . Abdominal hysterectomy  2011    SOCIAL HISTORY: History   Social History  . Marital Status: Divorced    Spouse Name: N/A    Number of Children: N/A  . Years of Education: N/A   Occupational History  . Not on file.   Social History Main Topics  . Smoking status: Never Smoker   . Smokeless tobacco: Never Used  . Alcohol  Use: Yes     Comment: occas-maybe once a month  . Drug Use: No  . Sexual Activity:    Other Topics Concern  . Not on file   Social History Narrative  . No narrative on file    FAMILY HISTORY: History reviewed. No pertinent family history.  ALLERGIES:  is allergic to amoxil; penicillins; and latex.  MEDICATIONS:  Current Outpatient Prescriptions  Medication Sig Dispense Refill  . diazepam (VALIUM) 2 MG tablet Take 1 mg by mouth every  6 (six) hours as needed. For vertigo      . eletriptan (RELPAX) 20 MG tablet One tablet by mouth as needed for migraine headache.  If the headache improves and then returns, dose may be repeated after 2 hours have elapsed since first dose (do not exceed 80 mg per day). For migraine may repeat in 2 hours if necessary      . ergocalciferol (VITAMIN D2) 50000 UNITS capsule Take 50,000 Units by mouth once a week.      Marland Kitchen FOLIC ACID PO Take 2 tablets by mouth daily.      Marland Kitchen loratadine (CLARITIN) 10 MG tablet Take 10 mg by mouth daily as needed. Allergies       . Methotrexate, PF, 12.5 MG/0.25ML SOAJ Inject 12.5 mg into the skin once a week.      . polyethylene glycol powder (GLYCOLAX/MIRALAX) powder Take 17 g by mouth every three (3) days as needed.      . hydrochlorothiazide (HYDRODIURIL) 25 MG tablet Take 25 mg by mouth at bedtime.        No current facility-administered medications for this visit.   REVIEW OF SYSTEMS:   Constitutional: Denies fevers, chills or abnormal night sweats Eyes: Denies blurriness of vision, double vision or watery eyes. She describe occasional tunnel vision that comes and goes Ears, nose, mouth, throat, and face: Denies mucositis or sore throat Respiratory: Denies cough, dyspnea or wheezes Cardiovascular: Denies palpitation, chest discomfort or lower extremity swelling Gastrointestinal:  Denies nausea, heartburn or change in bowel habits Skin: Denies abnormal skin rashes Lymphatics: Denies new lymphadenopathy or easy bruising Neurological:Denies numbness, tingling or new weaknesses Behavioral/Psych: Mood is stable, no new changes  All other systems were reviewed with the patient and are negative.  PHYSICAL EXAMINATION: ECOG PERFORMANCE STATUS: 1 - Symptomatic but completely ambulatory  Filed Vitals:   12/27/12 1018  BP: 121/83  Pulse: 84  Temp: 97 F (36.1 C)  Resp: 19   Filed Weights   12/27/12 1018  Weight: 147 lb 6.4 oz (66.86 kg)    GENERAL:alert, no  distress and comfortable SKIN: Very faint rash on her face but non-elsewhere EYES: normal, conjunctiva are pink and non-injected, sclera clear OROPHARYNX:no exudate, no erythema and lips, buccal mucosa, and tongue normal  NECK: supple, she has a palpable goiter in her neck, nontender LYMPH:  no palpable lymphadenopathy in the cervical, axillary or inguinal LUNGS: clear to auscultation and percussion with normal breathing effort HEART: regular rate & rhythm and no murmurs and no lower extremity edema ABDOMEN:abdomen soft, non-tender and normal bowel sounds Musculoskeletal:no cyanosis of digits and no clubbing  PSYCH: alert & oriented x 3 with fluent speech NEURO: no focal motor/sensory deficits  LABORATORY DATA:  I have reviewed the data as listed above from outside records RADIOGRAPHIC STUDIES: I have personally reviewed the radiological images as listed and agreed with the findings in the report. No results found.  ASSESSMENT:  Polyclonal increase in immunoglobulins Goiter Anemia Leukopenia SLE  PLAN:  #1 SLE She's been started on methotrexate. She felt symptomatic improvement. We will continue treatment per direction by her rheumatologist #2 Goiter It is not uncommon in patients with systemic lupus could have other form of autoimmune disease. She also has complaints of fatigue and constipation. I will go ahead and check a power function test today. #3 anemia This is likely anemia of chronic disease. The patient denies recent history of bleeding such as epistaxis, hematuria or hematochezia. She is asymptomatic from the anemia. We will observe for now.  she does not require transfusion now.  I will order some additional work up for this #4 leukopenia This could be due to either side effects of methotrexate or systemic lupus. She is asymptomatic. I will order an additional workup for this #5 polyclonal immunoglobulins It could be related to her active systemic lupus. Chronic  infection cannot be ruled out. I will also order an additional workup to rule out multiple myeloma. #6 history of kidney lesion She was never proven to have cancer. I would direct her future followup on the ablated kidney lesion to her urologist. Orders Placed This Encounter  Procedures  . Flow Cytometry    Standing Status: Future     Number of Occurrences:      Standing Expiration Date: 12/27/2013  . Ferritin    Standing Status: Future     Number of Occurrences:      Standing Expiration Date: 12/27/2013  . Iron and TIBC    Standing Status: Future     Number of Occurrences:      Standing Expiration Date: 12/27/2013  . Vitamin B12    Standing Status: Future     Number of Occurrences:      Standing Expiration Date: 12/27/2013  . Smear    Standing Status: Future     Number of Occurrences:      Standing Expiration Date: 12/27/2013  . Sedimentation rate    Standing Status: Future     Number of Occurrences:      Standing Expiration Date: 12/27/2013  . Protein electrophoresis, serum    Standing Status: Future     Number of Occurrences:      Standing Expiration Date: 12/27/2013  . Kappa/lambda light chains    Standing Status: Future     Number of Occurrences:      Standing Expiration Date: 12/27/2013  . Hepatitis B surface antibody    Standing Status: Future     Number of Occurrences:      Standing Expiration Date: 12/27/2013  . Hepatitis B surface antigen    Standing Status: Future     Number of Occurrences:      Standing Expiration Date: 12/27/2013  . Hepatitis B core antibody, total    Standing Status: Future     Number of Occurrences:      Standing Expiration Date: 12/27/2013  . Hepatitis C antibody    Standing Status: Future     Number of Occurrences:      Standing Expiration Date: 12/27/2013  . Immunofixation electrophoresis    Standing Status: Future     Number of Occurrences:      Standing Expiration Date: 12/27/2013  . T4, free    Standing Status: Future      Number of Occurrences:      Standing Expiration Date: 12/27/2013  . T3, free    Standing Status: Future     Number of Occurrences:      Standing Expiration Date: 12/27/2013  .  TSH    Standing Status: Future     Number of Occurrences:      Standing Expiration Date: 12/27/2013    All questions were answered. The patient knows to call the clinic with any problems, questions or concerns. I spent 40 minutes counseling the patient face to face. The total time spent in the appointment was 60 minutes and more than 50% was on counseling.     Jerrilyn Messinger, MD 12/27/2012 10:45 AM

## 2012-12-27 NOTE — Progress Notes (Signed)
Checked in new patient with no financial issues. Gave her the appt card and she has not been to [place] or been around anyone that she knows of.

## 2013-01-01 LAB — SPEP & IFE WITH QIG
Albumin ELP: 52.6 % — ABNORMAL LOW (ref 55.8–66.1)
Alpha-1-Globulin: 3.2 % (ref 2.9–4.9)
Gamma Globulin: 25.7 % — ABNORMAL HIGH (ref 11.1–18.8)
IgA: 415 mg/dL — ABNORMAL HIGH (ref 69–380)
IgM, Serum: 163 mg/dL (ref 52–322)

## 2013-01-01 LAB — T3, FREE: T3, Free: 3.5 pg/mL (ref 2.3–4.2)

## 2013-01-01 LAB — KAPPA/LAMBDA LIGHT CHAINS
Kappa free light chain: 3.44 mg/dL — ABNORMAL HIGH (ref 0.33–1.94)
Kappa:Lambda Ratio: 1.39 (ref 0.26–1.65)
Lambda Free Lght Chn: 2.47 mg/dL (ref 0.57–2.63)

## 2013-01-01 LAB — HEPATITIS C ANTIBODY: HCV Ab: NEGATIVE

## 2013-01-01 LAB — SEDIMENTATION RATE: Sed Rate: 27 mm/hr — ABNORMAL HIGH (ref 0–22)

## 2013-01-01 LAB — HEPATITIS B CORE ANTIBODY, TOTAL: Hep B Core Total Ab: NONREACTIVE

## 2013-01-01 LAB — HEPATITIS B SURFACE ANTIGEN: Hepatitis B Surface Ag: NEGATIVE

## 2013-01-01 LAB — T4, FREE: Free T4: 1.1 ng/dL (ref 0.80–1.80)

## 2013-01-17 ENCOUNTER — Ambulatory Visit (HOSPITAL_BASED_OUTPATIENT_CLINIC_OR_DEPARTMENT_OTHER): Payer: No Typology Code available for payment source | Admitting: Hematology and Oncology

## 2013-01-17 ENCOUNTER — Encounter (INDEPENDENT_AMBULATORY_CARE_PROVIDER_SITE_OTHER): Payer: Self-pay

## 2013-01-17 VITALS — BP 123/88 | HR 82 | Temp 97.2°F | Resp 18 | Ht 64.0 in | Wt 149.3 lb

## 2013-01-17 DIAGNOSIS — D638 Anemia in other chronic diseases classified elsewhere: Secondary | ICD-10-CM

## 2013-01-17 DIAGNOSIS — E8809 Other disorders of plasma-protein metabolism, not elsewhere classified: Secondary | ICD-10-CM

## 2013-01-17 DIAGNOSIS — D72819 Decreased white blood cell count, unspecified: Secondary | ICD-10-CM

## 2013-01-17 DIAGNOSIS — D649 Anemia, unspecified: Secondary | ICD-10-CM

## 2013-01-17 DIAGNOSIS — M329 Systemic lupus erythematosus, unspecified: Secondary | ICD-10-CM

## 2013-01-17 DIAGNOSIS — D892 Hypergammaglobulinemia, unspecified: Secondary | ICD-10-CM

## 2013-01-17 NOTE — Progress Notes (Signed)
[facility] OFFICE PROGRESS NOTE  Colette Ribas, MD DIAGNOSIS:  Anemia, elevated immunoglobulin levels  SUMMARY OF HEMATOLOGIC HISTORY: This is a pleasant 47 year old lady with background history of systemic lupus was found to have abnormal blood work. INTERVAL HISTORY: Dawn Marsh 47 y.o. female returns for further followup. According to her, she recently developed more progressive pancytopenia with low white count and low hemoglobin. Methotrexate was discontinued and her medication was changed to [place]. She denies any recent fever, chills, night sweats or abnormal weight loss The patient denies any recent signs or symptoms of bleeding such as spontaneous epistaxis, hematuria or hematochezia.  I have reviewed the past medical history, past surgical history, social history and family history with the patient and they are unchanged from previous note.  ALLERGIES:  is allergic to amoxil; penicillins; and latex.  MEDICATIONS:  Current Outpatient Prescriptions  Medication Sig Dispense Refill  . diazepam (VALIUM) 2 MG tablet Take 1 mg by mouth every 6 (six) hours as needed. For vertigo      . eletriptan (RELPAX) 20 MG tablet One tablet by mouth as needed for migraine headache.  If the headache improves and then returns, dose may be repeated after 2 hours have elapsed since first dose (do not exceed 80 mg per day). For migraine may repeat in 2 hours if necessary      . ergocalciferol (VITAMIN D2) 50000 UNITS capsule Take 50,000 Units by mouth once a week.      Marland Kitchen FOLIC ACID PO Take 2 tablets by mouth daily.      . hydrochlorothiazide (HYDRODIURIL) 25 MG tablet Take 25 mg by mouth at bedtime.       Marland Kitchen leflunomide (ARAVA) 20 MG tablet Take 10 mg by mouth daily.      Marland Kitchen loratadine (CLARITIN) 10 MG tablet Take 10 mg by mouth daily as needed. Allergies       . polyethylene glycol powder (GLYCOLAX/MIRALAX) powder Take 17 g by mouth every three (3) days as needed.       No  current facility-administered medications for this visit.     REVIEW OF SYSTEMS:   Constitutional: Denies fevers, chills or night sweats All other systems were reviewed with the patient and are negative.  PHYSICAL EXAMINATION: ECOG PERFORMANCE STATUS: 0 - Asymptomatic  Filed Vitals:   01/17/13 1503  BP: 123/88  Pulse: 82  Temp: 97.2 F (36.2 C)  Resp: 18   Filed Weights   01/17/13 1503  Weight: 149 lb 4.8 oz (67.722 kg)    GENERAL:alert, no distress and comfortable NEURO: alert & oriented x 3 with fluent speech, no focal motor/sensory deficits  LABORATORY DATA:  I have reviewed the data as listed ASSESSMENT:  Abnormal blood work, consistent with systemic lupus and treatment related side effects  PLAN:  #1 leukopenia This is likely due to recent treatment. The patient denies recent history of fevers, cough, chills, diarrhea or dysuria. She is asymptomatic from the leukopenia. I will observe for now.  I would defer to treatment decision to her rheumatologist #2 anemia This is likely due to recent treatment. The patient denies recent history of bleeding such as epistaxis, hematuria or hematochezia. She is asymptomatic from the anemia. I will observe for now.  She does not require transfusion now. #3 elevated immunoglobulin levels The patient has polyclonal gammopathy likely related to systemic lupus. There were no detectable monoclonal proteins. I reassured the patient. #4 fatigue Her thyroid function test is normal. This is likely  related to her treatment.  All questions were answered. The patient knows to call the clinic with any problems, questions or concerns. No barriers to learning was detected.  I spent 15 minutes counseling the patient face to face. The total time spent in the appointment was 20 minutes and more than 50% was on counseling.     [facility], Kaylor Simenson, MD 01/17/2013 4:38 PM

## 2013-03-22 ENCOUNTER — Other Ambulatory Visit (HOSPITAL_COMMUNITY): Payer: Self-pay | Admitting: Family Medicine

## 2013-03-22 ENCOUNTER — Ambulatory Visit (HOSPITAL_COMMUNITY)
Admission: RE | Admit: 2013-03-22 | Discharge: 2013-03-22 | Disposition: A | Discharge status: Home / Self Care | Payer: BC Managed Care – PPO | Source: Ambulatory Visit | Attending: Family Medicine | Admitting: Family Medicine

## 2013-03-22 DIAGNOSIS — J069 Acute upper respiratory infection, unspecified: Secondary | ICD-10-CM

## 2013-03-22 DIAGNOSIS — R0789 Other chest pain: Secondary | ICD-10-CM | POA: Insufficient documentation

## 2013-04-08 ENCOUNTER — Ambulatory Visit: Payer: No Typology Code available for payment source | Admitting: Neurology

## 2013-08-11 ENCOUNTER — Emergency Department (HOSPITAL_COMMUNITY)
Admission: EM | Admit: 2013-08-11 | Discharge: 2013-08-11 | Disposition: A | Discharge status: Home / Self Care | Payer: BC Managed Care – PPO | Attending: Emergency Medicine | Admitting: Emergency Medicine

## 2013-08-11 ENCOUNTER — Emergency Department (HOSPITAL_COMMUNITY): Payer: BC Managed Care – PPO

## 2013-08-11 ENCOUNTER — Encounter (HOSPITAL_COMMUNITY): Payer: Self-pay | Admitting: Emergency Medicine

## 2013-08-11 DIAGNOSIS — Z79899 Other long term (current) drug therapy: Secondary | ICD-10-CM | POA: Insufficient documentation

## 2013-08-11 DIAGNOSIS — Z8639 Personal history of other endocrine, nutritional and metabolic disease: Secondary | ICD-10-CM | POA: Insufficient documentation

## 2013-08-11 DIAGNOSIS — G43909 Migraine, unspecified, not intractable, without status migrainosus: Secondary | ICD-10-CM | POA: Insufficient documentation

## 2013-08-11 DIAGNOSIS — I10 Essential (primary) hypertension: Secondary | ICD-10-CM | POA: Insufficient documentation

## 2013-08-11 DIAGNOSIS — Z8709 Personal history of other diseases of the respiratory system: Secondary | ICD-10-CM | POA: Insufficient documentation

## 2013-08-11 DIAGNOSIS — R040 Epistaxis: Secondary | ICD-10-CM | POA: Insufficient documentation

## 2013-08-11 DIAGNOSIS — R0789 Other chest pain: Secondary | ICD-10-CM | POA: Insufficient documentation

## 2013-08-11 DIAGNOSIS — Z8739 Personal history of other diseases of the musculoskeletal system and connective tissue: Secondary | ICD-10-CM | POA: Insufficient documentation

## 2013-08-11 DIAGNOSIS — Z880 Allergy status to penicillin: Secondary | ICD-10-CM | POA: Insufficient documentation

## 2013-08-11 DIAGNOSIS — R011 Cardiac murmur, unspecified: Secondary | ICD-10-CM | POA: Insufficient documentation

## 2013-08-11 DIAGNOSIS — Z91040 Latex allergy status: Secondary | ICD-10-CM | POA: Insufficient documentation

## 2013-08-11 DIAGNOSIS — Z8719 Personal history of other diseases of the digestive system: Secondary | ICD-10-CM | POA: Insufficient documentation

## 2013-08-11 DIAGNOSIS — Z862 Personal history of diseases of the blood and blood-forming organs and certain disorders involving the immune mechanism: Secondary | ICD-10-CM | POA: Insufficient documentation

## 2013-08-11 DIAGNOSIS — F411 Generalized anxiety disorder: Secondary | ICD-10-CM | POA: Insufficient documentation

## 2013-08-11 LAB — CBC WITH DIFFERENTIAL/PLATELET
BASOS ABS: 0 10*3/uL (ref 0.0–0.1)
BASOS PCT: 0 % (ref 0–1)
EOS PCT: 1 % (ref 0–5)
Eosinophils Absolute: 0 10*3/uL (ref 0.0–0.7)
HEMATOCRIT: 33.2 % — AB (ref 36.0–46.0)
HEMOGLOBIN: 11.1 g/dL — AB (ref 12.0–15.0)
LYMPHS PCT: 49 % — AB (ref 12–46)
Lymphs Abs: 1.3 10*3/uL (ref 0.7–4.0)
MCH: 30.7 pg (ref 26.0–34.0)
MCHC: 33.4 g/dL (ref 30.0–36.0)
MCV: 92 fL (ref 78.0–100.0)
Monocytes Absolute: 0.4 10*3/uL (ref 0.1–1.0)
Monocytes Relative: 17 % — ABNORMAL HIGH (ref 3–12)
NEUTROS ABS: 0.9 10*3/uL — AB (ref 1.7–7.7)
Neutrophils Relative %: 33 % — ABNORMAL LOW (ref 43–77)
Platelets: 188 10*3/uL (ref 150–400)
RBC: 3.61 MIL/uL — AB (ref 3.87–5.11)
RDW: 13.1 % (ref 11.5–15.5)
WBC: 2.6 10*3/uL — AB (ref 4.0–10.5)

## 2013-08-11 LAB — COMPREHENSIVE METABOLIC PANEL
ALBUMIN: 3.9 g/dL (ref 3.5–5.2)
ALT: 34 U/L (ref 0–35)
AST: 32 U/L (ref 0–37)
Alkaline Phosphatase: 205 U/L — ABNORMAL HIGH (ref 39–117)
BILIRUBIN TOTAL: 0.4 mg/dL (ref 0.3–1.2)
BUN: 16 mg/dL (ref 6–23)
CO2: 25 mEq/L (ref 19–32)
Calcium: 9.4 mg/dL (ref 8.4–10.5)
Chloride: 103 mEq/L (ref 96–112)
Creatinine, Ser: 0.75 mg/dL (ref 0.50–1.10)
GFR calc Af Amer: >90 mL/min (ref >90)
GLUCOSE: 113 mg/dL — AB (ref 70–99)
POTASSIUM: 3.3 meq/L — AB (ref 3.7–5.3)
Sodium: 141 mEq/L (ref 137–147)
Total Protein: 8.2 g/dL (ref 6.0–8.3)

## 2013-08-11 LAB — D-DIMER, QUANTITATIVE: D-Dimer, Quant: 0.27 ug/mL-FEU (ref 0.00–0.48)

## 2013-08-11 LAB — TROPONIN I

## 2013-08-11 MED ORDER — OXYMETAZOLINE HCL 0.05 % NA SOLN
NASAL | Status: AC
  Administered 2013-08-11: 06:00:00
  Filled 2013-08-11: qty 15

## 2013-08-11 NOTE — ED Notes (Signed)
Pt alert & oriented x4, stable gait. Patient given discharge instructions, paperwork & prescription(s). Patient  instructed to stop at the registration desk to finish any additional paperwork. Patient verbalized understanding. Pt left department w/ no further questions. 

## 2013-08-11 NOTE — ED Provider Notes (Signed)
CSN: [id number]     Arrival date & time 08/11/13  4196 History   First MD Initiated Contact with Patient 08/11/13 [phone]     Chief Complaint  Patient presents with  . Epistaxis     (Consider location/radiation/quality/duration/timing/severity/associated sxs/prior Treatment) HPI Comments: 48 year old female with lupus, leukopenia presents for epistaxis and episode of chest pressure.  Early this morning patient developed episode of bright red bleeding from the left naris to improve mildly with pressure. Patient is on blood thinners and no history of similar symptoms she was very young. No injuries. Patient denies lightheadedness or syncope. Yesterday afternoon in evening patient had gradually worsening lower chest pressure and indigestion type sensation that moved to her upper stomach and resolved with no treatment. Patient denies any history of cardiac problems, blood clot history, recent surgeries, leg pain leg swelling, active cancer or long travel. Patient had very mild shortness of breath that went away with it. No pleuritic component. No history of pericardial effusion.  Patient is a 48 y.o. female presenting with nosebleeds. The history is provided by the patient.  Epistaxis Associated symptoms: no congestion, no fever and no headaches     Past Medical History  Diagnosis Date  . Vertigo   . Left renal mass   . Hypertension   . Goiter     sometimes trouble swallowing  . Chest pain     constant-ongoing for years--sometimes eases up--but always comes back  . Heart murmur   . GERD (gastroesophageal reflux disease)     burning in stomach and gas--no med  . Seizures     migraines  . Migraine   . Anxiety   . Rhinitis, allergic     seasonal  . SLE (systemic lupus erythematosus) 12/27/2012  . Nontoxic simple goitre 12/27/2012  . 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 Leukopenia 12/27/2012  . Anemia of other chronic disease 12/27/2012   Past Surgical History  Procedure Laterality  Date  . Myomectomy x 2 (prior to the hysterectomy)  2002 and 2006  . Abdominal hysterectomy  2011  . Kidney surgery     No family history on file. History  Substance Use Topics  . Smoking status: Never Smoker   . Smokeless tobacco: Never Used  . Alcohol Use: Yes     Comment: occas-maybe once a month   OB History   Grav Para Term Preterm Abortions TAB SAB Ect Mult Living                 Review of Systems  Constitutional: Negative for fever and chills.  HENT: Positive for nosebleeds. Negative for congestion.   Eyes: Negative for visual disturbance.  Respiratory: Positive for shortness of breath.   Cardiovascular: Positive for chest pain.  Gastrointestinal: Positive for nausea. Negative for vomiting and abdominal pain.  Genitourinary: Negative for dysuria and flank pain.  Musculoskeletal: Negative for back pain, neck pain and neck stiffness.  Skin: Negative for rash.  Neurological: Negative for light-headedness and headaches.      Allergies  Amoxil; Penicillins; and Latex  Home Medications   Prior to Admission medications   Medication Sig Start Date End Date Taking? Authorizing Provider  diazepam (VALIUM) 2 MG tablet Take 1 mg by mouth every 6 (six) hours as needed. For vertigo   Yes Historical Provider, MD  eletriptan (RELPAX) 20 MG tablet One tablet by mouth as needed for migraine headache.  If the headache improves and then returns, dose may be repeated after 2 hours have elapsed since first dose (  do not exceed 80 mg per day). For migraine may repeat in 2 hours if necessary   Yes Historical Provider, MD  FOLIC ACID PO Take 2 tablets by mouth daily.   Yes Historical Provider, MD  methotrexate (RHEUMATREX) 2.5 MG tablet Take 10 mg by mouth once a week. Caution:Chemotherapy. Protect from light.   Yes Historical Provider, MD  ergocalciferol (VITAMIN D2) 50000 UNITS capsule Take 50,000 Units by mouth once a week.    Historical Provider, MD  hydrochlorothiazide (HYDRODIURIL) 25  MG tablet Take 25 mg by mouth at bedtime.     Historical Provider, MD  leflunomide (ARAVA) 20 MG tablet Take 10 mg by mouth daily.    Historical Provider, MD  loratadine (CLARITIN) 10 MG tablet Take 10 mg by mouth daily as needed. Allergies     Historical Provider, MD  polyethylene glycol powder (GLYCOLAX/MIRALAX) powder Take 17 g by mouth every three (3) days as needed.    Historical Provider, MD   BP 152/103  Pulse 88  Resp 20  Ht 5\' 5"  (1.651 m)  Wt 138 lb (62.596 kg)  BMI 22.96 kg/m2  SpO2 100% Physical Exam  Nursing note and vitals reviewed. Constitutional: She is oriented to person, place, and time. She appears well-developed and well-nourished.  HENT:  Head: Normocephalic.  Mild bleeding from left naris no focal bleeding area noticed. No hematoma appreciated.  Eyes: Conjunctivae are normal. Right eye exhibits no discharge. Left eye exhibits no discharge.  Neck: Normal range of motion. Neck supple. No tracheal deviation present.  Cardiovascular: Normal rate and regular rhythm.   Pulmonary/Chest: Effort normal and breath sounds normal.  Abdominal: Soft. She exhibits no distension. There is no tenderness. There is no guarding.  Musculoskeletal: She exhibits no edema.  Neurological: She is alert and oriented to person, place, and time.  Skin: Skin is warm. No rash noted.  Psychiatric: She has a normal mood and affect.    ED Course  Procedures (including critical care time) Labs Review Labs Reviewed  CBC WITH DIFFERENTIAL - Abnormal; Notable for the following:    WBC 2.6 (*)    RBC 3.61 (*)    Hemoglobin 11.1 (*)    HCT 33.2 (*)    Neutrophils Relative % 33 (*)    Lymphocytes Relative 49 (*)    Monocytes Relative 17 (*)    Neutro Abs 0.9 (*)    All other components within normal limits  COMPREHENSIVE METABOLIC PANEL - Abnormal; Notable for the following:    Potassium 3.3 (*)    Glucose, Bld 113 (*)    Alkaline Phosphatase 205 (*)    All other components within normal  limits  TROPONIN I  D-DIMER, QUANTITATIVE    Imaging Review Dg Chest Portable 1 View  08/11/2013   CLINICAL DATA:  Epistaxis  EXAM: PORTABLE CHEST - 1 VIEW  COMPARISON:  03/22/2013  FINDINGS: Normal heart size and mediastinal contours. No acute infiltrate or edema. No effusion or pneumothorax. No acute osseous findings.  IMPRESSION: Negative chest.   Electronically Signed   By: Jorje Guild M.D.   On: 08/11/2013 04:39     EKG Interpretation None     EKG reviewed rate 78, sinus, nonspecific T wave abnormalities anterior leads, no acute ischemic findings, normal QT, normal PR MDM   Final diagnoses:  Chest discomfort  Epistaxis  Hypokalemia  Patient well-appearing with epistaxis. Patient low risk epistaxis. Recently started on methotrexate again the plan to check platelets and hemoglobin level. Patient had nonspecific  chest pressure 24 hours ago that resolved. Patient very low risk for cardiac and pulmonary embolism. Lupus history plan for d-dimer and cardiac screen with close outpatient followup if chest pain re\re presents. Discussed broad differential of lower chest discomfort and epigastric discomfort. EKG reviewed no ischemic findings.  Afrin sprayed and left naris followed by continuous pressure of patient's fingers.  No active bleeding on recheck patient observed in ER. No chest pain or shortness of breath today or since the day prior. Discussed followup outpatient. Results and differential diagnosis were discussed with the patient/parent/guardian. Close follow up outpatient was discussed, comfortable with the plan.   Filed Vitals:   08/11/13 0347  BP: 152/103  Pulse: 88  Resp: 20  Height: 5\' 5"  (1.651 m)  Weight: 138 lb (62.596 kg)  SpO2: 100%     \      Mariea Clonts, MD 08/11/13 [phone]

## 2013-08-11 NOTE — Discharge Instructions (Signed)
If you were given medicines take as directed.  If you are on coumadin or contraceptives realize their levels and effectiveness is altered by many different medicines.  If you have any reaction (rash, tongues swelling, other) to the medicines stop taking and see a physician.  Follow up for recheck of blood pressure with your doctor.  Please follow up as directed and return to the ER or see a physician for new or worsening symptoms.  Thank you. Filed Vitals:   08/11/13 0347  BP: 152/103  Pulse: 88  Resp: 20  Height: 5\' 5"  (1.651 m)  Weight: 138 lb (62.596 kg)  SpO2: 100%    Chest Pain (Nonspecific) Chest pain has many causes. Your pain could be caused by something serious, such as a heart attack or a blood clot in the lungs. It could also be caused by something less serious, such as a chest bruise or a virus. Follow up with your doctor. More lab tests or other studies may be needed to find the cause of your pain. Most of the time, nonspecific chest pain will improve within 2 to 3 days of rest and mild pain medicine. HOME CARE  For chest bruises, you may put ice on the sore area for 15-20 minutes, 03-04 times a day. Do this only if it makes you feel better.  Put ice in a plastic bag.  Place a towel between the skin and the bag.  Rest for the next 2 to 3 days.  Go back to work if the pain improves.  See your doctor if the pain lasts longer than 1 to 2 weeks.  Only take medicine as told by your doctor.  Quit smoking if you smoke. GET HELP RIGHT AWAY IF:   There is more pain or pain that spreads to the arm, neck, jaw, back, or belly (abdomen).  You have shortness of breath.  You cough more than usual or cough up blood.  You have very bad back or belly pain, feel sick to your stomach (nauseous), or throw up (vomit).  You have very bad weakness.  You pass out (faint).  You have a fever. Any of these problems may be serious and may be an emergency. Do not wait to see if the  problems will go away. Get medical help right away. Call your local emergency services 911 in [place] Do not drive yourself to the hospital. MAKE SURE YOU:   Understand these instructions.  Will watch this condition.  Will get help right away if you or your child is not doing well or gets worse. Document Released: 08/17/2007 Document Revised: 05/23/2011 Document Reviewed: 08/17/2007 [facility] Patient Information 2014 [place], [place].

## 2013-08-11 NOTE — ED Notes (Signed)
Pt states she was sleeping & woke with her nose bleeding. Denies have any issues with her nose.

## 2013-12-19 ENCOUNTER — Telehealth: Payer: Self-pay | Admitting: Medical Oncology

## 2013-12-19 NOTE — Telephone Encounter (Signed)
Patient called stating she saw her PCP and had lab work done that resulted in an increase in her LFT's and a decrease in her WBC's. Patient states her PCP, Dr. Hilma Favors, would like for her to see Dr. Alvy Bimler and be evaluated. Patient requesting an appt.  I requested that patient fax her labs from PCP to our office so Dr. Alvy Bimler can review. Patient stated she will fax.   MD inboxed for review.

## 2013-12-20 ENCOUNTER — Telehealth: Payer: Self-pay | Admitting: *Deleted

## 2013-12-20 NOTE — Telephone Encounter (Signed)
10/20 at 145 pm, 15 mins

## 2013-12-20 NOTE — Telephone Encounter (Signed)
Informed pt Dr. Alvy Bimler received her labs and scheduled to see her on 10/20.  She will get call from Scheduler to confirm time. Pt verbalized understanding.

## 2013-12-23 ENCOUNTER — Telehealth: Payer: Self-pay | Admitting: Hematology and Oncology

## 2013-12-23 NOTE — Telephone Encounter (Signed)
LVM reg 12/31/13 apt @1 :15 per  POF 12/20/13.-Amber

## 2013-12-31 ENCOUNTER — Ambulatory Visit (HOSPITAL_BASED_OUTPATIENT_CLINIC_OR_DEPARTMENT_OTHER): Payer: BC Managed Care – PPO | Admitting: Hematology and Oncology

## 2013-12-31 ENCOUNTER — Encounter: Payer: Self-pay | Admitting: Hematology and Oncology

## 2013-12-31 VITALS — BP 128/77 | HR 87 | Temp 97.4°F | Resp 18 | Ht 65.0 in | Wt 140.2 lb

## 2013-12-31 DIAGNOSIS — D638 Anemia in other chronic diseases classified elsewhere: Secondary | ICD-10-CM

## 2013-12-31 DIAGNOSIS — M329 Systemic lupus erythematosus, unspecified: Secondary | ICD-10-CM

## 2013-12-31 DIAGNOSIS — R748 Abnormal levels of other serum enzymes: Secondary | ICD-10-CM

## 2013-12-31 DIAGNOSIS — D892 Hypergammaglobulinemia, unspecified: Secondary | ICD-10-CM

## 2013-12-31 DIAGNOSIS — D72819 Decreased white blood cell count, unspecified: Secondary | ICD-10-CM

## 2013-12-31 DIAGNOSIS — D748 Other methemoglobinemias: Secondary | ICD-10-CM

## 2013-12-31 NOTE — Assessment & Plan Note (Signed)
This is likely due to recent treatment with methotrexate and related to her systemic lupus. The patient denies recent history of fevers, cough, chills, diarrhea or dysuria. She is asymptomatic from the leukopenia. I will observe for now.

## 2013-12-31 NOTE — Assessment & Plan Note (Signed)
This is related to systemic lupus. I reassured the patient.

## 2013-12-31 NOTE — Progress Notes (Signed)
[place] OFFICE PROGRESS NOTE  [name], MD SUMMARY OF HEMATOLOGIC HISTORY: This patient had background history of systemic lupus. She had history of severe, progressive pancytopenia with low white count and low hemoglobin. Methotrexate was discontinued and her medication was changed to [place]. Subsequently, she changed her rheumatologist and she was placed on methotrexate again, 15 mg weekly since July 2015.  INTERVAL HISTORY: Dawn Marsh 48 y.o. female returns for followup on her history of pancytopenia. The methotrexate has improved her symptoms significantly although she has chronic muscular skeletal pain. She denies any recent fever, chills, night sweats or abnormal weight loss or recent infections I have reviewed the past medical history, past surgical history, social history and family history with the patient and they are unchanged from previous note.  ALLERGIES:  is allergic to amoxil; penicillins; and latex.  MEDICATIONS:  Current Outpatient Prescriptions  Medication Sig Dispense Refill  . diazepam (VALIUM) 2 MG tablet Take 1 mg by mouth every 6 (six) hours as needed. For vertigo      . ergocalciferol (VITAMIN D2) 50000 UNITS capsule Take 50,000 Units by mouth once a week.      Marland Kitchen FOLIC ACID PO Take 1 tablet by mouth daily.       . methotrexate (RHEUMATREX) 2.5 MG tablet Take 15 mg by mouth once a week. Caution:Chemotherapy. Protect from light.      . Turmeric (RA TURMERIC) 500 MG CAPS Take by mouth.       No current facility-administered medications for this visit.     REVIEW OF SYSTEMS:   Constitutional: Denies fevers, chills or night sweats Eyes: Denies blurriness of vision Ears, nose, mouth, throat, and face: Denies mucositis or sore throat Respiratory: Denies cough, dyspnea or wheezes Cardiovascular: Denies palpitation, chest discomfort or lower extremity swelling Gastrointestinal:  Denies nausea, heartburn or change in bowel  habits Skin: Denies abnormal skin rashes Lymphatics: Denies new lymphadenopathy or easy bruising Neurological:Denies numbness, tingling or new weaknesses Behavioral/Psych: Mood is stable, no new changes  All other systems were reviewed with the patient and are negative.  PHYSICAL EXAMINATION: ECOG PERFORMANCE STATUS: 1 - Symptomatic but completely ambulatory  Filed Vitals:   12/31/13 1342  BP: 128/77  Pulse: 87  Temp: 97.4 F (36.3 C)  Resp: 18   Filed Weights   12/31/13 1342  Weight: 140 lb 3.2 oz (63.594 kg)    GENERAL:alert, no distress and comfortable she looks thin SKIN: skin color, texture, turgor are normal, no rashes or significant lesions EYES: normal, Conjunctiva are pink and non-injected, sclera clear Musculoskeletal:no cyanosis of digits and no clubbing  NEURO: alert & oriented x 3 with fluent speech, no focal motor/sensory deficits  LABORATORY DATA:  I have reviewed the data as listed No results found for this or any previous visit (from the past 48 hour(s)).  Lab Results  Component Value Date   WBC 2.6* 08/11/2013   HGB 11.1* 08/11/2013   HCT 33.2* 08/11/2013   MCV 92.0 08/11/2013   PLT 188 08/11/2013   I reviewed some of her external records from [place]:  SLE (systemic lupus erythematosus) She has been on methotrexate for the last few months with excellent response to treatment. 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 I reassured the patient.  Anemia in chronic illness This is likely combination of her lupus  and recent treatment with methotrexate. I recommend she takes high-dose folic acid. She is not symptomatic.  Elevated alkaline phosphatase measurement I suspect this could be either related to methotrexate or increased bone turnover due to early osteoporosis and osteomalacia from severe  vitamin D deficiency. She has chronic muscular skeletal pain. I reinforced the importance of her taking high-dose vitamin D supplements.  Leukopenia This is likely due to recent treatment with methotrexate and related to her systemic lupus. The patient denies recent history of fevers, cough, chills, diarrhea or dysuria. She is asymptomatic from the leukopenia. I will observe for now.       All questions were answered. The patient knows to call the clinic with any problems, questions or concerns. No barriers to learning was detected.  I spent 25 minutes counseling the patient face to face. The total time spent in the appointment was 30 minutes and more than 50% was on counseling.     [facility], Evan, MD 12/31/2013 8:29 PM

## 2013-12-31 NOTE — Assessment & Plan Note (Signed)
I suspect this could be either related to methotrexate or increased bone turnover due to early osteoporosis and osteomalacia from severe vitamin D deficiency. She has chronic muscular skeletal pain. I reinforced the importance of her taking high-dose vitamin D supplements.

## 2013-12-31 NOTE — Assessment & Plan Note (Signed)
This is likely combination of her lupus and recent treatment with methotrexate. I recommend she takes high-dose folic acid. She is not symptomatic.

## 2013-12-31 NOTE — Assessment & Plan Note (Signed)
She has been on methotrexate for the last few months with excellent response to treatment. I would defer to her rheumatologist to guide her treatments and recommend the patient to be compliant taking folic acid

## 2014-01-01 ENCOUNTER — Telehealth: Payer: Self-pay | Admitting: Hematology and Oncology

## 2014-01-01 NOTE — Telephone Encounter (Signed)
s.w. pt and advised on OCT 2016 appt.Marland KitchenMarland KitchenMarland KitchenMarland Kitchenpt ok adn aware

## 2014-01-30 ENCOUNTER — Other Ambulatory Visit: Payer: Self-pay | Admitting: Obstetrics and Gynecology

## 2014-02-03 LAB — CYTOLOGY - PAP

## 2014-05-19 ENCOUNTER — Telehealth: Payer: Self-pay | Admitting: *Deleted

## 2014-05-19 NOTE — Telephone Encounter (Signed)
Pt had labs done last week and the week before at [place] and [place].  She asks if Dr. Alvy Bimler will review them.  She is willing to come in for appt if needed.  Called pt and informed we do not have these lab results. Asked her to call the MD that ordered them or the labs to have them faxed to Dr. Alvy Bimler.  Informed her I can ask Dr. Alvy Bimler to review the labs and if needed she will make pt office visit.  Pt agreed and will get the lab results to Dr. Alvy Bimler to review.

## 2014-05-21 ENCOUNTER — Telehealth: Payer: Self-pay | Admitting: *Deleted

## 2014-05-21 NOTE — Telephone Encounter (Signed)
Received lab from 2/29, note from Dr Eda Paschal.  WBC 3.0 and ANC 1.0.  Dr. Stann Mainland holding MTX at this time.  Dr. Alvy Bimler says low WBC is from the Methotrexate and there is nothing new for her to consult on at this time.  Instructed pt to have Dr. Stann Mainland contact us if she feels she needs Dr. Calton Dach input again.  Otherwise keep yearly appt in October as scheduled. Pt verbalized understanding.

## 2014-06-14 ENCOUNTER — Telehealth: Payer: Self-pay | Admitting: *Deleted

## 2014-06-16 ENCOUNTER — Other Ambulatory Visit: Payer: Self-pay | Admitting: Hematology and Oncology

## 2014-06-16 ENCOUNTER — Telehealth: Payer: Self-pay | Admitting: Hematology and Oncology

## 2014-06-16 DIAGNOSIS — M329 Systemic lupus erythematosus, unspecified: Secondary | ICD-10-CM

## 2014-06-16 NOTE — Telephone Encounter (Signed)
Pt says her Rheumatologist at [facility] thinks pt needs to see Dr. Alvy Bimler again due to low WBC in spite of being off MTX for over a month.  Pt recently had labs done last week at [place] and she will request they fax [place] a copy.  Pt sees MD at [facility] next week on 4/15 and asks if she can see Dr. Alvy Bimler before 4/15?

## 2014-06-16 NOTE — Telephone Encounter (Signed)
left message for pt regarding to 4.12 appt.Marland KitchenMarland KitchenMarland KitchenMarland Kitchenpatient ok and aware

## 2014-06-24 ENCOUNTER — Ambulatory Visit (HOSPITAL_BASED_OUTPATIENT_CLINIC_OR_DEPARTMENT_OTHER): Payer: BLUE CROSS/BLUE SHIELD | Admitting: Hematology and Oncology

## 2014-06-24 ENCOUNTER — Other Ambulatory Visit (HOSPITAL_BASED_OUTPATIENT_CLINIC_OR_DEPARTMENT_OTHER): Payer: BLUE CROSS/BLUE SHIELD

## 2014-06-24 ENCOUNTER — Other Ambulatory Visit: Payer: Self-pay

## 2014-06-24 ENCOUNTER — Encounter: Payer: Self-pay | Admitting: Hematology and Oncology

## 2014-06-24 VITALS — BP 128/80 | HR 78 | Temp 98.5°F | Resp 16 | Ht 65.0 in | Wt 142.6 lb

## 2014-06-24 DIAGNOSIS — M329 Systemic lupus erythematosus, unspecified: Secondary | ICD-10-CM | POA: Diagnosis not present

## 2014-06-24 DIAGNOSIS — D892 Hypergammaglobulinemia, unspecified: Secondary | ICD-10-CM

## 2014-06-24 DIAGNOSIS — D72819 Decreased white blood cell count, unspecified: Secondary | ICD-10-CM

## 2014-06-24 DIAGNOSIS — D638 Anemia in other chronic diseases classified elsewhere: Secondary | ICD-10-CM

## 2014-06-24 LAB — MORPHOLOGY
PLT EST: ADEQUATE
RBC COMMENTS: NORMAL

## 2014-06-24 LAB — CBC WITH DIFFERENTIAL/PLATELET
BASO%: 0.3 % (ref 0.0–2.0)
Basophils Absolute: 0 10*3/uL (ref 0.0–0.1)
EOS%: 2 % (ref 0.0–7.0)
Eosinophils Absolute: 0.1 10*3/uL (ref 0.0–0.5)
HCT: 35.7 % (ref 34.8–46.6)
HGB: 11.8 g/dL (ref 11.6–15.9)
LYMPH%: 42.6 % (ref 14.0–49.7)
MCH: 31 pg (ref 25.1–34.0)
MCHC: 33.1 g/dL (ref 31.5–36.0)
MCV: 93.7 fL (ref 79.5–101.0)
MONO#: 0.6 10*3/uL (ref 0.1–0.9)
MONO%: 15.4 % — AB (ref 0.0–14.0)
NEUT#: 1.4 10*3/uL — ABNORMAL LOW (ref 1.5–6.5)
NEUT%: 39.7 % (ref 38.4–76.8)
Platelets: 215 10*3/uL (ref 145–400)
RBC: 3.81 10*6/uL (ref 3.70–5.45)
RDW: 13 % (ref 11.2–14.5)
WBC: 3.6 10*3/uL — ABNORMAL LOW (ref 3.9–10.3)
lymph#: 1.5 10*3/uL (ref 0.9–3.3)

## 2014-06-25 LAB — SEDIMENTATION RATE: SED RATE: 32 mm/h — AB (ref 0–20)

## 2014-06-25 NOTE — Assessment & Plan Note (Signed)
She has been off methotrexate for at least 2 months. She felt that she have relapse of joint pain, headaches and other nonspecific symptoms. I would defer to her rheumatologist to guide her treatments and recommend the patient to be compliant taking folic acid

## 2014-06-25 NOTE — Progress Notes (Signed)
[place] OFFICE PROGRESS NOTE  [name], MD SUMMARY OF HEMATOLOGIC HISTORY:  This patient had background history of systemic lupus. She had history of severe, progressive pancytopenia with low white count and low hemoglobin. Methotrexate was discontinued and her medication was changed to [place]. Subsequently, she changed her rheumatologist and she was placed on methotrexate again, 15 mg weekly since July 2015, off by the end of 2015 INTERVAL HISTORY: Dawn Marsh 49 y.o. female returns for further follow-up. She have serial blood count monitoring done recently which show persistent leukopenia and hence was referred back here. Since she was off methotrexate, she had nonspecific complaints including mental fogginess, nonspecific body aches and joint pain. She has occasional headaches. Denies recent infection or the need for antibiotic therapy.  I have reviewed the past medical history, past surgical history, social history and family history with the patient and they are unchanged from previous note.  ALLERGIES:  is allergic to amoxil; penicillins; and latex.  MEDICATIONS:  Current Outpatient Prescriptions  Medication Sig Dispense Refill  . folic acid (FOLVITE) 1 MG tablet Take 2 mg by mouth daily.     No current facility-administered medications for this visit.     REVIEW OF SYSTEMS:   Constitutional: Denies fevers, chills  Eyes: Denies blurriness of vision Ears, nose, mouth, throat, and face: Denies mucositis or sore throat Respiratory: Denies cough, dyspnea or wheezes Cardiovascular: Denies palpitation, chest discomfort or lower extremity swelling Gastrointestinal:  Denies nausea, heartburn or change in bowel habits Skin: Denies abnormal skin rashes Lymphatics: Denies new lymphadenopathy or easy bruising Neurological:Denies numbness, tingling or new weaknesses Behavioral/Psych: Mood is stable, no new changes  All other systems were reviewed with  the patient and are negative.  PHYSICAL EXAMINATION: ECOG PERFORMANCE STATUS: 0 - Asymptomatic  Filed Vitals:   06/24/14 1501  BP: 128/80  Pulse: 78  Temp: 98.5 F (36.9 C)  Resp: 16   Filed Weights   06/24/14 1501  Weight: 142 lb 9.6 oz (64.683 kg)    GENERAL:alert, no distress and comfortable SKIN: skin color, texture, turgor are normal, no rashes or significant lesions EYES: normal, Conjunctiva are pink and non-injected, sclera clear OROPHARYNX:no exudate, no erythema and lips, buccal mucosa, and tongue normal  NECK: supple, thyroid normal size, non-tender, without nodularity LYMPH:  no palpable lymphadenopathy in the cervical, axillary or inguinal LUNGS: clear to auscultation and percussion with normal breathing effort HEART: regular rate & rhythm and no murmurs and no lower extremity edema ABDOMEN:abdomen soft, non-tender and normal bowel sounds Musculoskeletal:no cyanosis of digits and no clubbing  NEURO: alert & oriented x 3 with fluent speech, no focal motor/sensory deficits  LABORATORY DATA:  I have reviewed the data as listed Results for orders placed or performed in visit on 06/24/14 (from the past 48 hour(s))  CBC with Differential/Platelet     Status: Abnormal   Collection Time: 06/24/14  2:51 PM  Result Value Ref Range   WBC 3.6 (L) 3.9 - 10.3 10e3/uL   NEUT# 1.4 (L) 1.5 - 6.5 10e3/uL   HGB 11.8 11.6 - 15.9 g/dL   HCT 35.7 34.8 - 46.6 %   Platelets 215 145 - 400 10e3/uL   MCV 93.7 79.5 - 101.0 fL   MCH 31.0 25.1 - 34.0 pg   MCHC 33.1 31.5 - 36.0 g/dL   RBC 3.81 3.70 - 5.45 10e6/uL   RDW 13.0 11.2 - 14.5 %   lymph# 1.5 0.9 - 3.3 10e3/uL   MONO# 0.6  0.1 - 0.9 10e3/uL   Eosinophils Absolute 0.1 0.0 - 0.5 10e3/uL   Basophils Absolute 0.0 0.0 - 0.1 10e3/uL   NEUT% 39.7 38.4 - 76.8 %   LYMPH% 42.6 14.0 - 49.7 %   MONO% 15.4 (H) 0.0 - 14.0 %   EOS% 2.0 0.0 - 7.0 %   BASO% 0.3 0.0 - 2.0 %  Morphology     Status: None   Collection Time: 06/24/14  2:51 PM   Result Value Ref Range   RBC Comments Within Normal Limits Within Normal Limits   White Cell Comments C/W auto diff    PLT EST Adequate Adequate  Sedimentation rate     Status: Abnormal   Collection Time: 06/24/14  2:52 PM  Result Value Ref Range   Sed Rate 32 (H) 0 - 20 mm/hr    Lab Results  Component Value Date   WBC 3.6* 06/24/2014   HGB 11.8 06/24/2014   HCT 35.7 06/24/2014   MCV 93.7 06/24/2014   PLT 215 06/24/2014    ASSESSMENT & PLAN:  SLE (systemic lupus erythematosus) She has been off methotrexate for at least 2 months. She felt that she have relapse of joint pain, headaches and other nonspecific symptoms. 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 The patient denies recent history of fevers, cough, chills, diarrhea or dysuria. She is asymptomatic from the leukopenia. I will observe for now.   If she needs to go back to immunosuppressive therapy, I can get her return appointment for close blood, monitoring. At present time, she does not need any prophylactic antibiotic treatment.      All questions were answered. The patient knows to call the clinic with any problems, questions or concerns. No barriers to learning was detected.  I spent 15 minutes counseling the patient face to face. The total time spent in the appointment was 20 minutes and more than 50% was on counseling.     [facility], Dawn Revolorio, MD 4/13/201612:39 PM

## 2014-06-25 NOTE — Assessment & Plan Note (Signed)
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 The patient denies recent history of fevers, cough, chills, diarrhea or dysuria. She is asymptomatic from the leukopenia. I will observe for now.   If she needs to go back to immunosuppressive therapy, I can get her return appointment for close blood, monitoring. At present time, she does not need any prophylactic antibiotic treatment.

## 2014-08-15 ENCOUNTER — Other Ambulatory Visit: Payer: Self-pay

## 2014-08-15 ENCOUNTER — Emergency Department (HOSPITAL_COMMUNITY): Payer: BLUE CROSS/BLUE SHIELD

## 2014-08-15 ENCOUNTER — Encounter (HOSPITAL_COMMUNITY): Payer: Self-pay | Admitting: Emergency Medicine

## 2014-08-15 ENCOUNTER — Emergency Department (HOSPITAL_COMMUNITY)
Admission: EM | Admit: 2014-08-15 | Discharge: 2014-08-15 | Disposition: A | Discharge status: Home / Self Care | Payer: BLUE CROSS/BLUE SHIELD | Attending: Emergency Medicine | Admitting: Emergency Medicine

## 2014-08-15 DIAGNOSIS — R011 Cardiac murmur, unspecified: Secondary | ICD-10-CM | POA: Diagnosis not present

## 2014-08-15 DIAGNOSIS — I493 Ventricular premature depolarization: Secondary | ICD-10-CM | POA: Diagnosis not present

## 2014-08-15 DIAGNOSIS — I10 Essential (primary) hypertension: Secondary | ICD-10-CM | POA: Insufficient documentation

## 2014-08-15 DIAGNOSIS — R002 Palpitations: Secondary | ICD-10-CM | POA: Diagnosis present

## 2014-08-15 DIAGNOSIS — Z880 Allergy status to penicillin: Secondary | ICD-10-CM | POA: Insufficient documentation

## 2014-08-15 DIAGNOSIS — Z79899 Other long term (current) drug therapy: Secondary | ICD-10-CM | POA: Diagnosis not present

## 2014-08-15 DIAGNOSIS — Z8739 Personal history of other diseases of the musculoskeletal system and connective tissue: Secondary | ICD-10-CM | POA: Insufficient documentation

## 2014-08-15 DIAGNOSIS — R079 Chest pain, unspecified: Secondary | ICD-10-CM

## 2014-08-15 HISTORY — DX: Reserved for concepts with insufficient information to code with codable children: IMO0002

## 2014-08-15 HISTORY — DX: Systemic lupus erythematosus, unspecified: M32.9

## 2014-08-15 LAB — BASIC METABOLIC PANEL
Anion gap: 8 (ref 5–15)
BUN: 16 mg/dL (ref 6–20)
CHLORIDE: 106 mmol/L (ref 101–111)
CO2: 27 mmol/L (ref 22–32)
Calcium: 9.5 mg/dL (ref 8.9–10.3)
Creatinine, Ser: 0.88 mg/dL (ref 0.44–1.00)
Glucose, Bld: 97 mg/dL (ref 65–99)
Potassium: 3.6 mmol/L (ref 3.5–5.1)
Sodium: 141 mmol/L (ref 135–145)

## 2014-08-15 LAB — CBC
HCT: 37.1 % (ref 36.0–46.0)
HEMOGLOBIN: 12.1 g/dL (ref 12.0–15.0)
MCH: 30.3 pg (ref 26.0–34.0)
MCHC: 32.6 g/dL (ref 30.0–36.0)
MCV: 92.8 fL (ref 78.0–100.0)
Platelets: 231 10*3/uL (ref 150–400)
RBC: 4 MIL/uL (ref 3.87–5.11)
RDW: 12.8 % (ref 11.5–15.5)
WBC: 2.7 10*3/uL — AB (ref 4.0–10.5)

## 2014-08-15 LAB — TROPONIN I: Troponin I: <0.03 ng/mL (ref <0.031)

## 2014-08-15 NOTE — Discharge Instructions (Signed)
Chest Pain (Nonspecific) °It is often hard to give a specific diagnosis for the cause of chest pain. There is always a chance that your pain could be related to something serious, such as a heart attack or a blood clot in the lungs. You need to follow up with your health care provider for further evaluation. °CAUSES  °· Heartburn. °· Pneumonia or bronchitis. °· Anxiety or stress. °· Inflammation around your heart (pericarditis) or lung (pleuritis or pleurisy). °· A blood clot in the lung. °· A collapsed lung (pneumothorax). It can develop suddenly on its own (spontaneous pneumothorax) or from trauma to the chest. °· Shingles infection (herpes zoster virus). °The chest wall is composed of bones, muscles, and cartilage. Any of these can be the source of the pain. °· The bones can be bruised by injury. °· The muscles or cartilage can be strained by coughing or overwork. °· The cartilage can be affected by inflammation and become sore (costochondritis). °DIAGNOSIS  °Lab tests or other studies may be needed to find the cause of your pain. Your health care provider may have you take a test called an ambulatory electrocardiogram (ECG). An ECG records your heartbeat patterns over a 24-hour period. You may also have other tests, such as: °· Transthoracic echocardiogram (TTE). During echocardiography, sound waves are used to evaluate how blood flows through your heart. °· Transesophageal echocardiogram (TEE). °· Cardiac monitoring. This allows your health care provider to monitor your heart rate and rhythm in real time. °· Holter monitor. This is a portable device that records your heartbeat and can help diagnose heart arrhythmias. It allows your health care provider to track your heart activity for several days, if needed. °· Stress tests by exercise or by giving medicine that makes the heart beat faster. °TREATMENT  °· Treatment depends on what may be causing your chest pain. Treatment may include: °· Acid blockers for  heartburn. °· Anti-inflammatory medicine. °· Pain medicine for inflammatory conditions. °· Antibiotics if an infection is present. °· You may be advised to change lifestyle habits. This includes stopping smoking and avoiding alcohol, caffeine, and chocolate. °· You may be advised to keep your head raised (elevated) when sleeping. This reduces the chance of acid going backward from your stomach into your esophagus. °Most of the time, nonspecific chest pain will improve within 2-3 days with rest and mild pain medicine.  °HOME CARE INSTRUCTIONS  °· If antibiotics were prescribed, take them as directed. Finish them even if you start to feel better. °· For the next few days, avoid physical activities that bring on chest pain. Continue physical activities as directed. °· Do not use any tobacco products, including cigarettes, chewing tobacco, or electronic cigarettes. °· Avoid drinking alcohol. °· Only take medicine as directed by your health care provider. °· Follow your health care provider's suggestions for further testing if your chest pain does not go away. °· Keep any follow-up appointments you made. If you do not go to an appointment, you could develop lasting (chronic) problems with pain. If there is any problem keeping an appointment, call to reschedule. °SEEK MEDICAL CARE IF:  °· Your chest pain does not go away, even after treatment. °· You have a rash with blisters on your chest. °· You have a fever. °SEEK IMMEDIATE MEDICAL CARE IF:  °· You have increased chest pain or pain that spreads to your arm, neck, jaw, back, or abdomen. °· You have shortness of breath. °· You have an increasing cough, or you cough   up blood.  You have severe back or abdominal pain.  You feel nauseous or vomit.  You have severe weakness.  You faint.  You have chills. This is an emergency. Do not wait to see if the pain will go away. Get medical help at once. Call your local emergency services (911 in [place] Do not drive  yourself to the hospital. MAKE SURE YOU:   Understand these instructions.  Will watch your condition.  Will get help right away if you are not doing well or get worse. Document Released: 12/08/2004 Document Revised: 03/05/2013 Document Reviewed: 10/04/2007 [facility] Patient Information 2015 [place], [place]. This information is not intended to replace advice given to you by your health care provider. Make sure you discuss any questions you have with your health care provider.  Premature Ventricular Contraction Premature ventricular contraction (PVC) is an irregularity of the heart rhythm involving extra or skipped heartbeats. In some cases, they may occur without obvious cause or heart disease. Other times, they can be caused by an electrolyte change in the blood. These need to be corrected. They can also be seen when there is not enough oxygen going to the heart. A common cause of this is plaque or cholesterol buildup. This buildup decreases the blood supply to the heart. In addition, extra beats may be caused or aggravated by:  Excessive smoking.  Alcohol consumption.  Caffeine.  Certain medications  Some street drugs. SYMPTOMS   The sensation of feeling your heart skipping a beat (palpitations).  In many cases, the person may have no symptoms. SIGNS AND TESTS   A physical examination may show an occasional irregularity, but if the PVC beats do not happen often, they may not be found on physical exam.  Blood pressure is usually normal.  Other tests that may find extra beats of the heart are:  An EKG (electrocardiogram)  A Holter monitor which can monitor your heart over longer periods of time  An Angiogram (study of the heart arteries). TREATMENT  Usually extra heartbeats do not need treatment. The condition is treated only if symptoms are severe or if extra beats are very frequent or are causing problems. An underlying cause, if discovered, may also require treatment.    Treatment may also be needed if there may be a risk for other more serious cardiac arrhythmias.  PREVENTION   Moderation in caffeine, alcohol, and tobacco use may reduce the risk of ectopic heartbeats in some people.  Exercise often helps people who lead a sedentary (inactive) lifestyle. PROGNOSIS  PVC heartbeats are generally harmless and do not need treatment.  RISKS AND COMPLICATIONS   Ventricular tachycardia (occasionally).  There usually are no complications.  Other arrhythmias (occasionally). SEEK IMMEDIATE MEDICAL CARE IF:   You feel palpitations that are frequent or continual.  You develop chest pain or other problems such as shortness of breath, sweating, or nausea and vomiting.  You become light-headed or faint (pass out).  You get worse or do not improve with treatment. Document Released: 10/16/2003 Document Revised: 05/23/2011 Document Reviewed: 04/27/2007 [facility] Patient Information 2015 [place], [place]. This information is not intended to replace advice given to you by your health care provider. Make sure you discuss any questions you have with your health care provider.

## 2014-08-15 NOTE — ED Notes (Signed)
MD at bedside. 

## 2014-08-15 NOTE — ED Provider Notes (Signed)
CSN: [id number]     Arrival date & time 08/15/14  0819 History  This chart was scribed for Tanna Furry, MD by Mercy Moore, ED scribe.  This patient was seen in room APA05/APA05 and the patient's care was started at 8:53 AM.   Chief Complaint  Patient presents with  . Palpitations   The history is provided by the patient. No language interpreter was used.   HPI Comments: Dawn Marsh is a 49 y.o. female who presents to the Emergency Department complaining of sternal chest pain since last night. Patient reports history of intermittent dull left chest pain for approximately one year now. Patient reports previous evaluation of her heart including echocardiogram. Patient reports that her current chest pain is concerning because it is of a different quality; she reports fleeting 20 sec episodes of intermittent midsternal chest pain, palpitations described as "hard noticeable beating" and aching pain in her right arm and axillary region. Patient reports shortness of breath which she attributes to her lupus and adverse side effects to her Prednisone treatment. Patient shares that her lupus "involves her nervous system." Patient states that she was taking Methotrexate, but had to temporarily discontinue use given side effect of leukopenia. Patient reports history of anxiety without her medication and reports recent increased stress due to her nephew's stroke.   Past Medical History  Diagnosis Date  . Lupus   . Heart murmur   . Hypertension    Past Surgical History  Procedure Laterality Date  . Abdominal hysterectomy    . Tumor removal    . Myomectomy abdominal approach     History reviewed. No pertinent family history. History  Substance Use Topics  . Smoking status: Never Smoker   . Smokeless tobacco: Not on file  . Alcohol Use: No   OB History    No data available     Review of Systems  Constitutional: Negative for fever, chills, diaphoresis, appetite change and fatigue.  HENT:  Negative for mouth sores, sore throat and trouble swallowing.   Eyes: Negative for visual disturbance.  Respiratory: Negative for cough, chest tightness, shortness of breath and wheezing.   Cardiovascular: Positive for chest pain and palpitations.  Gastrointestinal: Negative for nausea, vomiting, abdominal pain, diarrhea and abdominal distention.  Endocrine: Negative for polydipsia, polyphagia and polyuria.  Genitourinary: Negative for dysuria, frequency and hematuria.  Musculoskeletal: Negative for gait problem.  Skin: Negative for color change, pallor and rash.  Neurological: Negative for dizziness, syncope, light-headedness and headaches.  Hematological: Does not bruise/bleed easily.  Psychiatric/Behavioral: Negative for behavioral problems and confusion.      Allergies  Amoxicillin and Penicillins  Home Medications   Prior to Admission medications   Medication Sig Start Date End Date Taking? Authorizing Provider  azaTHIOprine (IMURAN) 50 MG tablet Take 50 mg by mouth daily. 07/08/14  Yes Historical Provider, MD  diazepam (VALIUM) 2 MG tablet Take 1 mg by mouth daily as needed for anxiety.   Yes Historical Provider, MD  Vitamin D, Ergocalciferol, (DRISDOL) 50000 UNITS CAPS capsule Take 50,000 Units by mouth every 7 (seven) days.   Yes Historical Provider, MD   Triage Vitals: BP 132/90 mmHg  Pulse 74  Temp(Src) 98.2 F (36.8 C) (Oral)  Resp 18  Ht 5\' 4"  (1.626 m)  Wt 140 lb (63.504 kg)  BMI 24.02 kg/m2  SpO2 100% Physical Exam  Constitutional: She is oriented to person, place, and time. She appears well-developed and well-nourished. No distress.  HENT:  Head: Normocephalic.  Eyes: Conjunctivae  are normal. Pupils are equal, round, and reactive to light. No scleral icterus.  Neck: Normal range of motion. Neck supple. No thyromegaly present.  Cardiovascular: Normal rate and regular rhythm.  Exam reveals no gallop and no friction rub.   No murmur heard. Pulmonary/Chest:  Effort normal and breath sounds normal. No respiratory distress. She has no wheezes. She has no rales.  Abdominal: Soft. Bowel sounds are normal. She exhibits no distension. There is no tenderness. There is no rebound.  Musculoskeletal: Normal range of motion.  Neurological: She is alert and oriented to person, place, and time.  Skin: Skin is warm and dry. No rash noted.  Psychiatric: She has a normal mood and affect. Her behavior is normal.    ED Course  Procedures (including critical care time)  COORDINATION OF CARE: 9:07 AM- Patient informed of negative EKG results. Discussed treatment plan with patient at bedside and patient agreed to plan.   Labs Review Labs Reviewed  CBC - Abnormal; Notable for the following:    WBC 2.7 (*)    All other components within normal limits  TROPONIN I  BASIC METABOLIC PANEL    Imaging Review Dg Chest 2 View  08/15/2014   CLINICAL DATA:  Chest pain for several days.  EXAM: CHEST  2 VIEW  COMPARISON:  None.  FINDINGS: The heart size and mediastinal contours are within normal limits. Both lungs are clear. No pneumothorax or pleural effusion is noted. The visualized skeletal structures are unremarkable.  IMPRESSION: No active cardiopulmonary disease.   Electronically Signed   By: Marijo Conception, M.D.   On: 08/15/2014 09:56     EKG Interpretation None      MDM   Final diagnoses:  Chest pain  PVC's (premature ventricular contractions)    Patient reports years of pressure or chest. No change recently. Intermittent episodes of sharp pain associated with a feeling of a hard heart beat and then a pause. Has a PVC in the emergency room which does reproduce her symptoms. Negative enzymes. Normal chest x-ray. Low risk for coronary artery disease.  Appropriate for outpatient treatment.  Primary care follow-up.  I personally performed the services described in this documentation, which was scribed in my presence. The recorded information has been  reviewed and is accurate.   Tanna Furry, MD 08/15/14 1028

## 2014-08-15 NOTE — ED Notes (Signed)
Pt reports has had chest pain for "awhile". Pt reports this episode started last night. Pt reports midsternal chest pain radiating to left jaw and left breast. nad noted. Pt reports nausea and reports "increase in stress."

## 2014-09-09 ENCOUNTER — Telehealth: Payer: Self-pay | Admitting: *Deleted

## 2014-09-09 ENCOUNTER — Telehealth: Payer: Self-pay | Admitting: Hematology and Oncology

## 2014-09-09 ENCOUNTER — Other Ambulatory Visit: Payer: Self-pay | Admitting: *Deleted

## 2014-09-09 NOTE — Telephone Encounter (Signed)
VM message from patient received @ 12:20 pm. Call back to patient and she states she has found a lump in her neck, not painful, not red. She wants to know if she should have her PCP evaluate it or come in to see Dr. Alvy Bimler.  Please advise.

## 2014-09-09 NOTE — Telephone Encounter (Signed)
Sent msg to MD/AM to override MD visit 06/29 @1 :30 per MD 06/28 POF, pt is aware... KJ

## 2014-09-09 NOTE — Telephone Encounter (Signed)
TC to patient and she said she could come in tomorrow @ 1: 30 pm. POF sent for appointment on 09/10/14

## 2014-09-09 NOTE — Telephone Encounter (Signed)
Can she come in tomorrow at 130 pm? If so, place POF

## 2014-09-10 ENCOUNTER — Ambulatory Visit (HOSPITAL_BASED_OUTPATIENT_CLINIC_OR_DEPARTMENT_OTHER): Payer: BLUE CROSS/BLUE SHIELD | Admitting: Hematology and Oncology

## 2014-09-10 ENCOUNTER — Telehealth: Payer: Self-pay | Admitting: Hematology and Oncology

## 2014-09-10 ENCOUNTER — Encounter: Payer: Self-pay | Admitting: Hematology and Oncology

## 2014-09-10 ENCOUNTER — Ambulatory Visit (HOSPITAL_BASED_OUTPATIENT_CLINIC_OR_DEPARTMENT_OTHER): Payer: BLUE CROSS/BLUE SHIELD

## 2014-09-10 ENCOUNTER — Telehealth: Payer: Self-pay | Admitting: *Deleted

## 2014-09-10 VITALS — BP 108/75 | HR 80 | Temp 98.1°F | Resp 18 | Ht 65.0 in | Wt 140.9 lb

## 2014-09-10 DIAGNOSIS — D72819 Decreased white blood cell count, unspecified: Secondary | ICD-10-CM

## 2014-09-10 DIAGNOSIS — R591 Generalized enlarged lymph nodes: Secondary | ICD-10-CM

## 2014-09-10 DIAGNOSIS — M329 Systemic lupus erythematosus, unspecified: Secondary | ICD-10-CM

## 2014-09-10 DIAGNOSIS — D638 Anemia in other chronic diseases classified elsewhere: Secondary | ICD-10-CM

## 2014-09-10 DIAGNOSIS — E049 Nontoxic goiter, unspecified: Secondary | ICD-10-CM | POA: Diagnosis not present

## 2014-09-10 DIAGNOSIS — D892 Hypergammaglobulinemia, unspecified: Secondary | ICD-10-CM

## 2014-09-10 DIAGNOSIS — R599 Enlarged lymph nodes, unspecified: Secondary | ICD-10-CM

## 2014-09-10 DIAGNOSIS — R300 Dysuria: Secondary | ICD-10-CM

## 2014-09-10 DIAGNOSIS — N2889 Other specified disorders of kidney and ureter: Secondary | ICD-10-CM

## 2014-09-10 LAB — URINALYSIS, MICROSCOPIC - CHCC
BILIRUBIN (URINE): NEGATIVE
Glucose: NEGATIVE mg/dL
KETONES: NEGATIVE mg/dL
Nitrite: NEGATIVE
Protein: NEGATIVE mg/dL
Specific Gravity, Urine: 1.01 (ref 1.003–1.035)
Urobilinogen, UR: 0.2 mg/dL (ref 0.2–1)
pH: 6 (ref 4.6–8.0)

## 2014-09-10 LAB — COMPREHENSIVE METABOLIC PANEL (CC13)
ALBUMIN: 4 g/dL (ref 3.5–5.0)
ALK PHOS: 162 U/L — AB (ref 40–150)
ALT: 18 U/L (ref 0–55)
AST: 24 U/L (ref 5–34)
Anion Gap: 9 mEq/L (ref 3–11)
BUN: 15.4 mg/dL (ref 7.0–26.0)
CO2: 26 mEq/L (ref 22–29)
Calcium: 9.3 mg/dL (ref 8.4–10.4)
Chloride: 107 mEq/L (ref 98–109)
Creatinine: 0.8 mg/dL (ref 0.6–1.1)
EGFR: >90 mL/min/{1.73_m2} (ref >90)
Glucose: 101 mg/dl (ref 70–140)
POTASSIUM: 3.9 meq/L (ref 3.5–5.1)
SODIUM: 141 meq/L (ref 136–145)
TOTAL PROTEIN: 8.3 g/dL (ref 6.4–8.3)
Total Bilirubin: 0.5 mg/dL (ref 0.20–1.20)

## 2014-09-10 LAB — CBC WITH DIFFERENTIAL/PLATELET
BASO%: 0.4 % (ref 0.0–2.0)
Basophils Absolute: 0 10*3/uL (ref 0.0–0.1)
EOS%: 1.1 % (ref 0.0–7.0)
Eosinophils Absolute: 0 10*3/uL (ref 0.0–0.5)
HCT: 33.9 % — ABNORMAL LOW (ref 34.8–46.6)
HGB: 11.3 g/dL — ABNORMAL LOW (ref 11.6–15.9)
LYMPH%: 39.1 % (ref 14.0–49.7)
MCH: 30.7 pg (ref 25.1–34.0)
MCHC: 33.3 g/dL (ref 31.5–36.0)
MCV: 92.1 fL (ref 79.5–101.0)
MONO#: 0.4 10*3/uL (ref 0.1–0.9)
MONO%: 13.2 % (ref 0.0–14.0)
NEUT#: 1.3 10*3/uL — ABNORMAL LOW (ref 1.5–6.5)
NEUT%: 46.2 % (ref 38.4–76.8)
Platelets: 219 10*3/uL (ref 145–400)
RBC: 3.68 10*6/uL — AB (ref 3.70–5.45)
RDW: 13.6 % (ref 11.2–14.5)
WBC: 2.8 10*3/uL — ABNORMAL LOW (ref 3.9–10.3)
lymph#: 1.1 10*3/uL (ref 0.9–3.3)

## 2014-09-10 LAB — TSH CHCC: TSH: 0.701 m[IU]/L (ref 0.308–3.960)

## 2014-09-10 LAB — LACTATE DEHYDROGENASE (CC13): LDH: 161 U/L (ref 125–245)

## 2014-09-10 MED ORDER — PREDNISONE 50 MG PO TABS: ORAL_TABLET | ORAL | Status: DC

## 2014-09-10 NOTE — Telephone Encounter (Signed)
Pt confirmed labs/ov per 06/29 POF, gave pt AVS and Calendar... KJ, gave pt barium °

## 2014-09-10 NOTE — Telephone Encounter (Signed)
Pt called stating she does have the medication and she will start taking it.

## 2014-09-10 NOTE — Telephone Encounter (Signed)
LVM for pt informing of u/a shows infection per Dr. Alvy Bimler.   Pt had reported on earlier visit she has a Rx for Cipro which she had not started taking yet.  Instructed pt to start taking Cipro now for UTI but to please call us back and let us know if she needs a new Rx sent to her pharmacy.

## 2014-09-10 NOTE — Telephone Encounter (Signed)
Opened in error

## 2014-09-11 LAB — URINE CULTURE

## 2014-09-11 LAB — VITAMIN D 25 HYDROXY (VIT D DEFICIENCY, FRACTURES): Vit D, 25-Hydroxy: 39 ng/mL (ref 30–100)

## 2014-09-11 LAB — SEDIMENTATION RATE: SED RATE: 27 mm/h — AB (ref 0–20)

## 2014-09-11 LAB — T4, FREE: Free T4: 0.89 ng/dL (ref 0.80–1.80)

## 2014-09-12 DIAGNOSIS — R300 Dysuria: Secondary | ICD-10-CM | POA: Insufficient documentation

## 2014-09-12 DIAGNOSIS — R591 Generalized enlarged lymph nodes: Secondary | ICD-10-CM | POA: Insufficient documentation

## 2014-09-12 NOTE — Assessment & Plan Note (Signed)
She has been started on Imuran for 2 months. She have new lymphadenopathy and night sweats. It could be PTLD. I recommend CT scan as above

## 2014-09-12 NOTE — Progress Notes (Signed)
[facility] OFFICE PROGRESS NOTE  [name], MD SUMMARY OF HEMATOLOGIC HISTORY:  This patient had background history of systemic lupus. She had history of severe, progressive pancytopenia with low white count and low hemoglobin. Methotrexate was discontinued and her medication was changed to [place]. Subsequently, she changed her rheumatologist and she was placed on methotrexate again, 15 mg weekly since July 2015, off by the end of 2015 Her treatment was subsequently switched to Imuran since May 2016 The patient had prior diagnosis of kidney cancer status post ablation.  INTERVAL HISTORY: Dawn Marsh 49 y.o. female returns for urgent follow-up. She felt a new lump on the left side of the neck with some stiffness. She have daily night sweats for 3 months and profound fatigue. She have symptoms of dysuria headaches and dizziness. She has occasional nausea.  I have reviewed the past medical history, past surgical history, social history and family history with the patient and they are unchanged from previous note.  ALLERGIES:  is allergic to amoxil; penicillins; and latex.  MEDICATIONS:  Current Outpatient Prescriptions  Medication Sig Dispense Refill  . acetaminophen (TYLENOL) 500 MG tablet Take 500 mg by mouth every 6 (six) hours as needed.    Marland Kitchen azaTHIOprine (IMURAN) 50 MG tablet Take 50 mg by mouth daily.  0  . diazepam (VALIUM) 2 MG tablet Take 2 mg by mouth every 8 (eight) hours as needed. 1/2 TAB AS NEEDED.  0  . Vitamin D, Ergocalciferol, (DRISDOL) 50000 UNITS CAPS capsule Take 50,000 Units by mouth every 7 (seven) days.    . predniSONE (DELTASONE) 50 MG tablet To be taken 13 hours, 7 hours and 1 hour before CT scan 3 tablet 0   No current facility-administered medications for this visit.     REVIEW OF SYSTEMS:   Eyes: Denies blurriness of vision Ears, nose, mouth, throat, and face: Denies mucositis or sore throat Respiratory: Denies cough, dyspnea  or wheezes Cardiovascular: Denies palpitation, chest discomfort or lower extremity swelling Gastrointestinal:  Denies nausea, heartburn or change in bowel habits Skin: Denies abnormal skin rashes Lymphatics: Denies new lymphadenopathy or easy bruising Neurological:Denies numbness, tingling or new weaknesses Behavioral/Psych: Mood is stable, no new changes  All other systems were reviewed with the patient and are negative.  PHYSICAL EXAMINATION: ECOG PERFORMANCE STATUS: 1 - Symptomatic but completely ambulatory  Filed Vitals:   09/10/14 1328  BP: 108/75  Pulse: 80  Temp: 98.1 F (36.7 C)  Resp: 18   Filed Weights   09/10/14 1328  Weight: 140 lb 14.4 oz (63.912 kg)    GENERAL:alert, no distress and comfortable SKIN: skin color, texture, turgor are normal, no rashes or significant lesions EYES: normal, Conjunctiva are pink and non-injected, sclera clear OROPHARYNX:no exudate, no erythema and lips, buccal mucosa, and tongue normal  NECK: supple, thyroid normal size, non-tender, without nodularity LYMPH:  She has palpable lymphadenopathy in the cervical region.  LUNGS: clear to auscultation and percussion with normal breathing effort HEART: regular rate & rhythm and no murmurs and no lower extremity edema ABDOMEN:abdomen soft, non-tender and normal bowel sounds Musculoskeletal:no cyanosis of digits and no clubbing  NEURO: alert & oriented x 3 with fluent speech, no focal motor/sensory deficits  LABORATORY DATA:  I have reviewed the data as listed No results found for this or any previous visit (from the past 48 hour(s)).  Lab Results  Component Value Date   WBC 2.8* 09/10/2014   HGB 11.3* 09/10/2014   HCT 33.9* 09/10/2014  MCV 92.1 09/10/2014   PLT 219 09/10/2014    RADIOGRAPHIC STUDIES: I reviewed her prior imaging study related to kidney cancer I have personally reviewed the radiological images as listed and agreed with the findings in the report.  ASSESSMENT & PLAN:   Lymphadenopathy of head and neck She has new lymphadenopathy in the neck and unexplained night sweats. I am concerned about lymphoma and possibly recurrence of kidney cancer. I was stage her with imaging study to assess  SLE (systemic lupus erythematosus) She has been started on Imuran for 2 months. She have new lymphadenopathy and night sweats. It could be PTLD. I recommend CT scan as above  Dysuria She has dysuria. Urinalysis was suspicious for urinary tract infection. She has prescription ciprofloxacin to take.   All questions were answered. The patient knows to call the clinic with any problems, questions or concerns. No barriers to learning was detected.  I spent 30 minutes counseling the patient face to face. The total time spent in the appointment was 40 minutes and more than 50% was on counseling.     [facility], Tabetha Haraway, MD 7/1/20168:57 AM

## 2014-09-12 NOTE — Assessment & Plan Note (Signed)
She has dysuria. Urinalysis was suspicious for urinary tract infection. She has prescription ciprofloxacin to take.

## 2014-09-12 NOTE — Assessment & Plan Note (Signed)
She has new lymphadenopathy in the neck and unexplained night sweats. I am concerned about lymphoma and possibly recurrence of kidney cancer. I was stage her with imaging study to assess

## 2014-09-18 ENCOUNTER — Other Ambulatory Visit: Payer: Self-pay | Admitting: Hematology and Oncology

## 2014-09-18 ENCOUNTER — Ambulatory Visit (HOSPITAL_COMMUNITY)
Admission: RE | Admit: 2014-09-18 | Discharge: 2014-09-18 | Disposition: A | Discharge status: Home / Self Care | Payer: BLUE CROSS/BLUE SHIELD | Source: Ambulatory Visit | Attending: Hematology and Oncology | Admitting: Hematology and Oncology

## 2014-09-18 ENCOUNTER — Encounter (HOSPITAL_COMMUNITY): Payer: Self-pay

## 2014-09-18 DIAGNOSIS — R1310 Dysphagia, unspecified: Secondary | ICD-10-CM | POA: Insufficient documentation

## 2014-09-18 DIAGNOSIS — M329 Systemic lupus erythematosus, unspecified: Secondary | ICD-10-CM | POA: Diagnosis not present

## 2014-09-18 DIAGNOSIS — R590 Localized enlarged lymph nodes: Secondary | ICD-10-CM | POA: Diagnosis not present

## 2014-09-18 DIAGNOSIS — R1084 Generalized abdominal pain: Secondary | ICD-10-CM | POA: Insufficient documentation

## 2014-09-18 DIAGNOSIS — N2889 Other specified disorders of kidney and ureter: Secondary | ICD-10-CM

## 2014-09-18 DIAGNOSIS — R0602 Shortness of breath: Secondary | ICD-10-CM | POA: Insufficient documentation

## 2014-09-18 DIAGNOSIS — M436 Torticollis: Secondary | ICD-10-CM | POA: Insufficient documentation

## 2014-09-18 DIAGNOSIS — R221 Localized swelling, mass and lump, neck: Secondary | ICD-10-CM | POA: Insufficient documentation

## 2014-09-18 MED ORDER — IOHEXOL 300 MG/ML  SOLN: 125.0000 mL | Freq: Once | INTRAMUSCULAR | Status: DC | PRN

## 2014-09-18 MED ORDER — IOHEXOL 300 MG/ML  SOLN
150.0000 mL | Freq: Once | INTRAMUSCULAR | Status: AC | PRN
Start: 2014-09-18 — End: 2014-09-18
  Administered 2014-09-18: 125 mL via INTRAVENOUS

## 2014-09-22 ENCOUNTER — Other Ambulatory Visit: Payer: Self-pay | Admitting: Hematology and Oncology

## 2014-09-22 ENCOUNTER — Telehealth: Payer: Self-pay | Admitting: *Deleted

## 2014-09-22 ENCOUNTER — Ambulatory Visit (HOSPITAL_BASED_OUTPATIENT_CLINIC_OR_DEPARTMENT_OTHER): Payer: BLUE CROSS/BLUE SHIELD | Admitting: Hematology and Oncology

## 2014-09-22 ENCOUNTER — Encounter: Payer: Self-pay | Admitting: Hematology and Oncology

## 2014-09-22 VITALS — BP 129/86 | HR 83 | Temp 98.0°F | Resp 18 | Ht 65.0 in | Wt 140.9 lb

## 2014-09-22 DIAGNOSIS — N2889 Other specified disorders of kidney and ureter: Secondary | ICD-10-CM

## 2014-09-22 DIAGNOSIS — M329 Systemic lupus erythematosus, unspecified: Secondary | ICD-10-CM

## 2014-09-22 DIAGNOSIS — D72819 Decreased white blood cell count, unspecified: Secondary | ICD-10-CM | POA: Diagnosis not present

## 2014-09-22 DIAGNOSIS — R599 Enlarged lymph nodes, unspecified: Secondary | ICD-10-CM | POA: Diagnosis not present

## 2014-09-22 DIAGNOSIS — R591 Generalized enlarged lymph nodes: Secondary | ICD-10-CM

## 2014-09-22 DIAGNOSIS — G44029 Chronic cluster headache, not intractable: Secondary | ICD-10-CM

## 2014-09-22 DIAGNOSIS — N289 Disorder of kidney and ureter, unspecified: Secondary | ICD-10-CM

## 2014-09-22 NOTE — Telephone Encounter (Signed)
Pt left VM asking if Dr. Alvy Bimler thinks she should have MRI done of her Brain due to her headaches and new findings of enlarged lymph nodes?

## 2014-09-22 NOTE — Assessment & Plan Note (Signed)
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 The patient denies recent history of fevers, cough, chills, diarrhea or dysuria. She is asymptomatic from the leukopenia. I will observe for now.

## 2014-09-22 NOTE — Telephone Encounter (Signed)
Informed pt MRI Brain ordered by Dr. Alvy Bimler.  She verbalized understanding.

## 2014-09-22 NOTE — Progress Notes (Signed)
[place] OFFICE PROGRESS NOTE  [name], MD SUMMARY OF HEMATOLOGIC HISTORY:  This patient had background history of systemic lupus. She had history of severe, progressive pancytopenia with low white count and low hemoglobin. Methotrexate was discontinued and her medication was changed to [place]. Subsequently, she changed her rheumatologist and she was placed on methotrexate again, 15 mg weekly since July 2015, off by the end of 2015 Her treatment was subsequently switched to Imuran since May 2016 The patient had prior diagnosis of kidney cancer status post ablation.  INTERVAL HISTORY: Dawn Marsh 49 y.o. female returns for further follow-up. She complained of neck swelling, profound fatigue and persistent night sweats. Denies new lymphadenopathy.  I have reviewed the past medical history, past surgical history, social history and family history with the patient and they are unchanged from previous note.  ALLERGIES:  is allergic to amoxil; penicillins; and latex.  MEDICATIONS:  Current Outpatient Prescriptions  Medication Sig Dispense Refill  . acetaminophen (TYLENOL) 500 MG tablet Take 500 mg by mouth every 6 (six) hours as needed.    Marland Kitchen azaTHIOprine (IMURAN) 50 MG tablet Take 50 mg by mouth daily.  0  . diazepam (VALIUM) 2 MG tablet Take 2 mg by mouth every 8 (eight) hours as needed. 1/2 TAB AS NEEDED.  0  . Vitamin D, Ergocalciferol, (DRISDOL) 50000 UNITS CAPS capsule Take 50,000 Units by mouth every 30 (thirty) days.      No current facility-administered medications for this visit.     REVIEW OF SYSTEMS:   Constitutional: Denies fevers, chills  Eyes: Denies blurriness of vision Ears, nose, mouth, throat, and face: Denies mucositis or sore throat Respiratory: Denies cough, dyspnea or wheezes Cardiovascular: Denies palpitation, chest discomfort or lower extremity swelling Gastrointestinal:  Denies nausea, heartburn or change in bowel habits Skin:  Denies abnormal skin rashes Lymphatics: Denies new lymphadenopathy or easy bruising Neurological:Denies numbness, tingling or new weaknesses Behavioral/Psych: Mood is stable, no new changes  All other systems were reviewed with the patient and are negative.  PHYSICAL EXAMINATION: ECOG PERFORMANCE STATUS: 1 - Symptomatic but completely ambulatory  Filed Vitals:   09/22/14 1228  BP: 129/86  Pulse: 83  Temp: 98 F (36.7 C)  Resp: 18   Filed Weights   09/22/14 1228  Weight: 140 lb 14.4 oz (63.912 kg)    GENERAL:alert, no distress and comfortable SKIN: skin color, texture, turgor are normal, no rashes or significant lesions EYES: normal, Conjunctiva are pink and non-injected, sclera clear Musculoskeletal:no cyanosis of digits and no clubbing  NEURO: alert & oriented x 3 with fluent speech, no focal motor/sensory deficits  LABORATORY DATA:  I have reviewed the data as listed No results found for this or any previous visit (from the past 48 hour(s)).  Lab Results  Component Value Date   WBC 2.8* 09/10/2014   HGB 11.3* 09/10/2014   HCT 33.9* 09/10/2014   MCV 92.1 09/10/2014   PLT 219 09/10/2014    RADIOGRAPHIC STUDIES: I reviewed the CT scan with her I have personally reviewed the radiological images as listed and agreed with the findings in the report.  ASSESSMENT & PLAN:  Lymphadenopathy of head and neck Clinically, the distribution of the lymphadenopathy is highly suspicious for reactive lymphadenopathy. However, she did carry history of kidney cancer and is on chronic immunosuppressive therapy that could predispose her to other conditions such as lymphoproliferative disorder. She is interested to pursue biopsy for this. I would recommend PET CT scan for guidance  as to the best lymph node for biopsy before I refer her to general surgery. 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 The patient denies recent history of fevers, cough, chills, diarrhea or dysuria. She is asymptomatic from the leukopenia. I will observe for now.    Renal mass, left She had history of kidney cancer, ablated previously. She has another lesion that resemble angiomyolipoma. As mentioned above, I will get her case presented at the next hematology tumor board for further discussion   All questions were answered. The patient knows to call the clinic with any problems, questions or concerns. No barriers to learning was detected.  I spent 20 minutes counseling the patient face to face. The total time spent in the appointment was 30 minutes and more than 50% was on counseling.     [facility], Nayib Remer, MD 7/11/20161:06 PM

## 2014-09-22 NOTE — Assessment & Plan Note (Signed)
She had history of kidney cancer, ablated previously. She has another lesion that resemble angiomyolipoma. As mentioned above, I will get her case presented at the next hematology tumor board for further discussion

## 2014-09-22 NOTE — Assessment & Plan Note (Signed)
Clinically, the distribution of the lymphadenopathy is highly suspicious for reactive lymphadenopathy. However, she did carry history of kidney cancer and is on chronic immunosuppressive therapy that could predispose her to other conditions such as lymphoproliferative disorder. She is interested to pursue biopsy for this. I would recommend PET CT scan for guidance as to the best lymph node for biopsy before I refer her to general surgery. I will present her case at the next hematology tumor board for further discussion

## 2014-09-22 NOTE — Telephone Encounter (Signed)
I will order that

## 2014-10-03 ENCOUNTER — Other Ambulatory Visit: Payer: Self-pay | Admitting: Hematology and Oncology

## 2014-10-03 ENCOUNTER — Ambulatory Visit (HOSPITAL_COMMUNITY)
Admission: RE | Admit: 2014-10-03 | Discharge: 2014-10-03 | Disposition: A | Discharge status: Home / Self Care | Payer: BLUE CROSS/BLUE SHIELD | Source: Ambulatory Visit | Attending: Hematology and Oncology | Admitting: Hematology and Oncology

## 2014-10-03 DIAGNOSIS — M329 Systemic lupus erythematosus, unspecified: Secondary | ICD-10-CM

## 2014-10-03 DIAGNOSIS — G44029 Chronic cluster headache, not intractable: Secondary | ICD-10-CM

## 2014-10-03 DIAGNOSIS — R51 Headache: Secondary | ICD-10-CM | POA: Insufficient documentation

## 2014-10-03 DIAGNOSIS — N2889 Other specified disorders of kidney and ureter: Secondary | ICD-10-CM

## 2014-10-03 DIAGNOSIS — R591 Generalized enlarged lymph nodes: Secondary | ICD-10-CM

## 2014-10-03 LAB — GLUCOSE, CAPILLARY
GLUCOSE-CAPILLARY: 48 mg/dL — AB (ref 65–99)
Glucose-Capillary: 90 mg/dL (ref 65–99)

## 2014-10-03 MED ORDER — GADOBENATE DIMEGLUMINE 529 MG/ML IV SOLN
15.0000 mL | Freq: Once | INTRAVENOUS | Status: AC | PRN
  Administered 2014-10-03: 14 mL via INTRAVENOUS

## 2014-10-03 MED ORDER — FLUDEOXYGLUCOSE F - 18 (FDG) INJECTION
6.9300 | Freq: Once | INTRAVENOUS | Status: AC | PRN
  Administered 2014-10-03: 6.93 via INTRAVENOUS

## 2014-10-09 ENCOUNTER — Telehealth: Payer: Self-pay | Admitting: *Deleted

## 2014-10-09 NOTE — Telephone Encounter (Signed)
Pt left VM states has not heard results of her medical tests from Dr. Alvy Bimler.  Returned her call and informed Dr. Alvy Bimler will review/discuss results w/ her on Monday 8/1 as scheduled. Pt verbalized understanding and ok w/ this plan.  Says when she left message earlier, she didn't realize she had appt to see Dr. Alvy Bimler on Monday.

## 2014-10-13 ENCOUNTER — Telehealth: Payer: Self-pay | Admitting: Hematology and Oncology

## 2014-10-13 ENCOUNTER — Ambulatory Visit (HOSPITAL_BASED_OUTPATIENT_CLINIC_OR_DEPARTMENT_OTHER): Payer: BLUE CROSS/BLUE SHIELD

## 2014-10-13 ENCOUNTER — Ambulatory Visit (HOSPITAL_BASED_OUTPATIENT_CLINIC_OR_DEPARTMENT_OTHER): Payer: BLUE CROSS/BLUE SHIELD | Admitting: Hematology and Oncology

## 2014-10-13 ENCOUNTER — Encounter: Payer: Self-pay | Admitting: Hematology and Oncology

## 2014-10-13 VITALS — BP 118/65 | HR 84 | Temp 98.1°F | Resp 20 | Ht 65.0 in | Wt 141.1 lb

## 2014-10-13 DIAGNOSIS — R591 Generalized enlarged lymph nodes: Secondary | ICD-10-CM

## 2014-10-13 DIAGNOSIS — M329 Systemic lupus erythematosus, unspecified: Secondary | ICD-10-CM | POA: Diagnosis not present

## 2014-10-13 DIAGNOSIS — G43009 Migraine without aura, not intractable, without status migrainosus: Secondary | ICD-10-CM | POA: Diagnosis not present

## 2014-10-13 DIAGNOSIS — R300 Dysuria: Secondary | ICD-10-CM | POA: Diagnosis not present

## 2014-10-13 LAB — URINALYSIS, MICROSCOPIC - CHCC
Bilirubin (Urine): NEGATIVE
Blood: NEGATIVE
GLUCOSE UR CHCC: NEGATIVE mg/dL
Ketones: NEGATIVE mg/dL
NITRITE: NEGATIVE
PH: 6 (ref 4.6–8.0)
PROTEIN: NEGATIVE mg/dL
Specific Gravity, Urine: 1.02 (ref 1.003–1.035)
Urobilinogen, UR: 0.2 mg/dL (ref 0.2–1)

## 2014-10-13 NOTE — Assessment & Plan Note (Signed)
It is not unusual for SLE to cause lymphadenopathy. We reviewed her CT scan before and we suspect this could be due to reactive lymphadenopathy. However, from the patient's standpoint, she is concerned that this could be malignant such as lymphoma. She would like to pursue further with excisional lymph node biopsy. I will refer her to ENT surgeon for further evaluation

## 2014-10-13 NOTE — Telephone Encounter (Signed)
Pt confirmed labs/ov per 08/01 per POF, gave pt avs and calendar.Dawn Marsh, sent msg to HIM to schedule referral with Dr. Melony Overly.Marland KitchenMarland Kitchen

## 2014-10-13 NOTE — Assessment & Plan Note (Signed)
She has intermittent dysuria. I will order urinalysis and urine culture to follow.

## 2014-10-13 NOTE — Assessment & Plan Note (Signed)
She has intermittent migraine headaches. MRI is reassuring to show no evidence of intracranial metastasis.

## 2014-10-13 NOTE — Progress Notes (Signed)
[place] OFFICE PROGRESS NOTE  [name], MD SUMMARY OF HEMATOLOGIC HISTORY:  This patient had background history of systemic lupus. She had history of severe, progressive pancytopenia with low white count and low hemoglobin. Methotrexate was discontinued and her medication was changed to [place]. Subsequently, she changed her rheumatologist and she was placed on methotrexate again, 15 mg weekly since July 2015, off by the end of 2015 Her treatment was subsequently switched to Imuran since May 2016 The patient had prior diagnosis of kidney cancer status post ablation. She underwent CT scan at the body in July 2016, MR of the head and evaluation of lymphadenopathy. INTERVAL HISTORY: Dawn Marsh 49 y.o. female returns for further follow-up. She continues to have headaches, intermittent night sweats and persistent lymphadenopathy. She have intermittent dysuria. Denies any recent nausea or vomiting  I have reviewed the past medical history, past surgical history, social history and family history with the patient and they are unchanged from previous note.  ALLERGIES:  is allergic to amoxicillin; amoxil; penicillins; penicillins; and latex.  MEDICATIONS:  Current Outpatient Prescriptions  Medication Sig Dispense Refill  . acetaminophen (TYLENOL) 500 MG tablet Take 500 mg by mouth every 6 (six) hours as needed.    Marland Kitchen azaTHIOprine (IMURAN) 50 MG tablet Take 50 mg by mouth daily.  0  . azaTHIOprine (IMURAN) 50 MG tablet Take 50 mg by mouth daily.  0  . diazepam (VALIUM) 2 MG tablet Take 2 mg by mouth every 8 (eight) hours as needed. 1/2 TAB AS NEEDED.  0  . diazepam (VALIUM) 2 MG tablet Take 1 mg by mouth daily as needed for anxiety.    . Vitamin D, Ergocalciferol, (DRISDOL) 50000 UNITS CAPS capsule Take 50,000 Units by mouth every 30 (thirty) days.     . Vitamin D, Ergocalciferol, (DRISDOL) 50000 UNITS CAPS capsule Take 50,000 Units by mouth every 7 (seven) days.      No current facility-administered medications for this visit.     REVIEW OF SYSTEMS:   Constitutional: Denies fevers, chills or night sweats Eyes: Denies blurriness of vision Ears, nose, mouth, throat, and face: Denies mucositis or sore throat Respiratory: Denies cough, dyspnea or wheezes Cardiovascular: Denies palpitation, chest discomfort or lower extremity swelling Gastrointestinal:  Denies nausea, heartburn or change in bowel habits Skin: Denies abnormal skin rashes Neurological:Denies numbness, tingling or new weaknesses Behavioral/Psych: Mood is stable, no new changes  All other systems were reviewed with the patient and are negative.  PHYSICAL EXAMINATION: ECOG PERFORMANCE STATUS: 1 - Symptomatic but completely ambulatory  Filed Vitals:   10/13/14 1241  BP: 118/65  Pulse: 84  Temp: 98.1 F (36.7 C)  Resp: 20   Filed Weights   10/13/14 1241  Weight: 141 lb 1.6 oz (64.003 kg)    GENERAL:alert, no distress and comfortable SKIN: skin color, texture, turgor are normal, no rashes or significant lesions EYES: normal, Conjunctiva are pink and non-injected, sclera clear Musculoskeletal:no cyanosis of digits and no clubbing  NEURO: alert & oriented x 3 with fluent speech, no focal motor/sensory deficits  LABORATORY DATA:  I have reviewed the data as listed No results found for this or any previous visit (from the past 48 hour(s)).  Lab Results  Component Value Date   WBC 2.8* 09/10/2014   HGB 11.3* 09/10/2014   HCT 33.9* 09/10/2014   MCV 92.1 09/10/2014   PLT 219 09/10/2014    RADIOGRAPHIC STUDIES: I reviewed the MRI and PET scan with her I have  personally reviewed the radiological images as listed and agreed with the findings in the report.  ASSESSMENT & PLAN:  Lymphadenopathy of head and neck It is not unusual for SLE to cause lymphadenopathy. We reviewed her CT scan before and we suspect this could be due to reactive lymphadenopathy. However, from the  patient's standpoint, she is concerned that this could be malignant such as lymphoma. She would like to pursue further with excisional lymph node biopsy. I will refer her to ENT surgeon for further evaluation  Migraine She has intermittent migraine headaches. MRI is reassuring to show no evidence of intracranial metastasis.  Dysuria She has intermittent dysuria. I will order urinalysis and urine culture to follow.   All questions were answered. The patient knows to call the clinic with any problems, questions or concerns. No barriers to learning was detected.  I spent 25 minutes counseling the patient face to face. The total time spent in the appointment was 30 minutes and more than 50% was on counseling.     Alvy Bimler, Sarahi Borland, MD 8/1/20163:20 PM

## 2014-10-14 ENCOUNTER — Telehealth: Payer: Self-pay | Admitting: *Deleted

## 2014-10-14 ENCOUNTER — Telehealth: Payer: Self-pay | Admitting: Hematology and Oncology

## 2014-10-14 LAB — URINE CULTURE

## 2014-10-14 NOTE — Telephone Encounter (Signed)
-----   Message from Heath Lark, MD sent at 10/14/2014  3:36 PM EDT ----- Regarding: urine cx PLease let her know urine culture is neg ----- Message -----    From: [facility]    Sent: 10/13/2014   1:43 PM      To: Heath Lark, MD

## 2014-10-14 NOTE — Telephone Encounter (Signed)
Faxed pt medical records to Dr. Melony Overly

## 2014-10-14 NOTE — Telephone Encounter (Signed)
Informed pt of Urine Culture negative.  She verbalized understanding. She reports she has appt to see Dr. Lucia Gaskins this Friday 8/5.

## 2014-10-27 ENCOUNTER — Other Ambulatory Visit: Payer: Self-pay | Admitting: Otolaryngology

## 2014-11-10 ENCOUNTER — Telehealth: Payer: Self-pay | Admitting: *Deleted

## 2014-11-10 NOTE — Telephone Encounter (Signed)
Pt states had biopsy done which was Negative.  Still has "inflammation in my lymph nodes."   Saw NP at [facility] and was taken off her Imuran for past two weeks.  Labs done at [place] last week and her Eosinophils were down to 1.0.  Has Dr. Alvy Bimler gotten copy of these labs for review?   Pt asks if the "spots in my pelvis and chest are lymph nodes or something else?"    She asks how Dr. Alvy Bimler thinks she should proceed?

## 2014-11-11 ENCOUNTER — Telehealth: Payer: Self-pay | Admitting: Hematology and Oncology

## 2014-11-11 NOTE — Telephone Encounter (Signed)
I reviewed the test results with the patient over the telephone. The lymph node biopsy is consistent with lupus, reactive only. I reassured the patient. She asked several questions regarding neutropenia while on treatment. I shared with her other patients who develop cytopenia during immunosuppressive therapy. That does not mean those medications are contraindicated. I addressed all her questions.

## 2014-12-30 ENCOUNTER — Ambulatory Visit: Payer: BC Managed Care – PPO | Admitting: Hematology and Oncology

## 2015-04-13 ENCOUNTER — Other Ambulatory Visit: Payer: Self-pay | Admitting: Endocrinology

## 2015-04-13 DIAGNOSIS — E049 Nontoxic goiter, unspecified: Secondary | ICD-10-CM

## 2015-04-15 ENCOUNTER — Ambulatory Visit
Admission: RE | Admit: 2015-04-15 | Discharge: 2015-04-15 | Disposition: A | Discharge status: Home / Self Care | Payer: BLUE CROSS/BLUE SHIELD | Source: Ambulatory Visit | Attending: Endocrinology | Admitting: Endocrinology

## 2015-04-15 DIAGNOSIS — E049 Nontoxic goiter, unspecified: Secondary | ICD-10-CM

## 2015-06-15 DIAGNOSIS — Z1389 Encounter for screening for other disorder: Secondary | ICD-10-CM | POA: Diagnosis not present

## 2015-06-15 DIAGNOSIS — Z6824 Body mass index (BMI) 24.0-24.9, adult: Secondary | ICD-10-CM | POA: Diagnosis not present

## 2015-06-15 DIAGNOSIS — Z0000 Encounter for general adult medical examination without abnormal findings: Secondary | ICD-10-CM | POA: Diagnosis not present

## 2015-06-16 DIAGNOSIS — Z79899 Other long term (current) drug therapy: Secondary | ICD-10-CM | POA: Diagnosis not present

## 2015-06-16 DIAGNOSIS — R4689 Other symptoms and signs involving appearance and behavior: Secondary | ICD-10-CM | POA: Diagnosis not present

## 2015-06-16 DIAGNOSIS — F329 Major depressive disorder, single episode, unspecified: Secondary | ICD-10-CM | POA: Diagnosis not present

## 2015-06-16 DIAGNOSIS — R4189 Other symptoms and signs involving cognitive functions and awareness: Secondary | ICD-10-CM | POA: Diagnosis not present

## 2015-06-16 DIAGNOSIS — M329 Systemic lupus erythematosus, unspecified: Secondary | ICD-10-CM | POA: Diagnosis not present

## 2015-07-13 DIAGNOSIS — M329 Systemic lupus erythematosus, unspecified: Secondary | ICD-10-CM | POA: Diagnosis not present

## 2015-07-23 DIAGNOSIS — Z79899 Other long term (current) drug therapy: Secondary | ICD-10-CM | POA: Diagnosis not present

## 2015-07-23 DIAGNOSIS — R091 Pleurisy: Secondary | ICD-10-CM | POA: Diagnosis not present

## 2015-07-23 DIAGNOSIS — Z5181 Encounter for therapeutic drug level monitoring: Secondary | ICD-10-CM | POA: Diagnosis not present

## 2015-07-23 DIAGNOSIS — M329 Systemic lupus erythematosus, unspecified: Secondary | ICD-10-CM | POA: Diagnosis not present

## 2015-07-23 DIAGNOSIS — Z6824 Body mass index (BMI) 24.0-24.9, adult: Secondary | ICD-10-CM | POA: Diagnosis not present

## 2015-07-23 DIAGNOSIS — E559 Vitamin D deficiency, unspecified: Secondary | ICD-10-CM | POA: Diagnosis not present

## 2015-07-23 DIAGNOSIS — F419 Anxiety disorder, unspecified: Secondary | ICD-10-CM | POA: Diagnosis not present

## 2015-07-24 DIAGNOSIS — R030 Elevated blood-pressure reading, without diagnosis of hypertension: Secondary | ICD-10-CM | POA: Diagnosis not present

## 2015-07-24 DIAGNOSIS — Z1389 Encounter for screening for other disorder: Secondary | ICD-10-CM | POA: Diagnosis not present

## 2015-07-24 DIAGNOSIS — L309 Dermatitis, unspecified: Secondary | ICD-10-CM | POA: Diagnosis not present

## 2015-07-24 DIAGNOSIS — Z6824 Body mass index (BMI) 24.0-24.9, adult: Secondary | ICD-10-CM | POA: Diagnosis not present

## 2015-07-24 DIAGNOSIS — B355 Tinea imbricata: Secondary | ICD-10-CM | POA: Diagnosis not present

## 2015-08-05 DIAGNOSIS — M329 Systemic lupus erythematosus, unspecified: Secondary | ICD-10-CM | POA: Diagnosis not present

## 2015-08-11 DIAGNOSIS — M329 Systemic lupus erythematosus, unspecified: Secondary | ICD-10-CM | POA: Diagnosis not present

## 2015-08-11 DIAGNOSIS — Z79899 Other long term (current) drug therapy: Secondary | ICD-10-CM | POA: Diagnosis not present

## 2015-09-08 DIAGNOSIS — M329 Systemic lupus erythematosus, unspecified: Secondary | ICD-10-CM | POA: Diagnosis not present

## 2015-10-05 ENCOUNTER — Encounter (HOSPITAL_COMMUNITY): Payer: Self-pay | Admitting: Emergency Medicine

## 2015-10-05 ENCOUNTER — Emergency Department (HOSPITAL_COMMUNITY)
Admission: EM | Admit: 2015-10-05 | Discharge: 2015-10-05 | Disposition: A | Discharge status: Home / Self Care | Payer: BLUE CROSS/BLUE SHIELD | Attending: Dermatology | Admitting: Dermatology

## 2015-10-05 DIAGNOSIS — I10 Essential (primary) hypertension: Secondary | ICD-10-CM | POA: Insufficient documentation

## 2015-10-05 DIAGNOSIS — G43909 Migraine, unspecified, not intractable, without status migrainosus: Secondary | ICD-10-CM | POA: Insufficient documentation

## 2015-10-05 DIAGNOSIS — Z5321 Procedure and treatment not carried out due to patient leaving prior to being seen by health care provider: Secondary | ICD-10-CM | POA: Insufficient documentation

## 2015-10-05 DIAGNOSIS — Z79899 Other long term (current) drug therapy: Secondary | ICD-10-CM | POA: Diagnosis not present

## 2015-10-05 NOTE — ED Triage Notes (Signed)
Pt reports headache,dizziness x3 weeks. nad noted. Pt alert and oriented. Steady gait noted. Speech clear. facial symmetry noted. Pt reports intermittent light and sound sensitivity.

## 2015-10-09 DIAGNOSIS — Z6825 Body mass index (BMI) 25.0-25.9, adult: Secondary | ICD-10-CM | POA: Diagnosis not present

## 2015-10-09 DIAGNOSIS — R42 Dizziness and giddiness: Secondary | ICD-10-CM | POA: Diagnosis not present

## 2015-10-09 DIAGNOSIS — Z79899 Other long term (current) drug therapy: Secondary | ICD-10-CM | POA: Diagnosis not present

## 2015-10-09 DIAGNOSIS — R51 Headache: Secondary | ICD-10-CM | POA: Diagnosis not present

## 2015-10-09 DIAGNOSIS — M329 Systemic lupus erythematosus, unspecified: Secondary | ICD-10-CM | POA: Diagnosis not present

## 2015-10-20 ENCOUNTER — Other Ambulatory Visit (HOSPITAL_COMMUNITY): Payer: Self-pay | Admitting: Physician Assistant

## 2015-10-20 DIAGNOSIS — M329 Systemic lupus erythematosus, unspecified: Secondary | ICD-10-CM

## 2015-10-21 ENCOUNTER — Other Ambulatory Visit (HOSPITAL_COMMUNITY): Payer: Self-pay | Admitting: Physician Assistant

## 2015-10-21 DIAGNOSIS — M329 Systemic lupus erythematosus, unspecified: Secondary | ICD-10-CM

## 2015-10-28 ENCOUNTER — Encounter (HOSPITAL_COMMUNITY): Payer: Self-pay

## 2015-10-28 ENCOUNTER — Ambulatory Visit (HOSPITAL_COMMUNITY)
Admission: RE | Admit: 2015-10-28 | Discharge: 2015-10-28 | Disposition: A | Discharge status: Home / Self Care | Payer: BLUE CROSS/BLUE SHIELD | Source: Ambulatory Visit | Attending: Physician Assistant | Admitting: Physician Assistant

## 2015-10-28 ENCOUNTER — Ambulatory Visit (HOSPITAL_COMMUNITY): Payer: BLUE CROSS/BLUE SHIELD

## 2015-10-28 DIAGNOSIS — M329 Systemic lupus erythematosus, unspecified: Secondary | ICD-10-CM

## 2015-11-06 DIAGNOSIS — R51 Headache: Secondary | ICD-10-CM | POA: Diagnosis not present

## 2015-11-06 DIAGNOSIS — E041 Nontoxic single thyroid nodule: Secondary | ICD-10-CM | POA: Diagnosis not present

## 2015-11-06 DIAGNOSIS — M329 Systemic lupus erythematosus, unspecified: Secondary | ICD-10-CM | POA: Diagnosis not present

## 2015-11-06 DIAGNOSIS — R42 Dizziness and giddiness: Secondary | ICD-10-CM | POA: Diagnosis not present

## 2015-11-09 DIAGNOSIS — M329 Systemic lupus erythematosus, unspecified: Secondary | ICD-10-CM | POA: Diagnosis not present

## 2015-12-15 DIAGNOSIS — Z79899 Other long term (current) drug therapy: Secondary | ICD-10-CM | POA: Diagnosis not present

## 2015-12-15 DIAGNOSIS — F419 Anxiety disorder, unspecified: Secondary | ICD-10-CM | POA: Diagnosis not present

## 2015-12-15 DIAGNOSIS — M329 Systemic lupus erythematosus, unspecified: Secondary | ICD-10-CM | POA: Diagnosis not present

## 2015-12-15 DIAGNOSIS — R091 Pleurisy: Secondary | ICD-10-CM | POA: Diagnosis not present

## 2015-12-15 DIAGNOSIS — R51 Headache: Secondary | ICD-10-CM | POA: Diagnosis not present

## 2015-12-15 DIAGNOSIS — D72819 Decreased white blood cell count, unspecified: Secondary | ICD-10-CM | POA: Diagnosis not present

## 2015-12-17 DIAGNOSIS — M329 Systemic lupus erythematosus, unspecified: Secondary | ICD-10-CM | POA: Diagnosis not present

## 2015-12-25 DIAGNOSIS — M329 Systemic lupus erythematosus, unspecified: Secondary | ICD-10-CM | POA: Diagnosis not present

## 2015-12-25 DIAGNOSIS — G43909 Migraine, unspecified, not intractable, without status migrainosus: Secondary | ICD-10-CM | POA: Diagnosis not present

## 2015-12-25 DIAGNOSIS — G44229 Chronic tension-type headache, not intractable: Secondary | ICD-10-CM | POA: Diagnosis not present

## 2015-12-25 DIAGNOSIS — R42 Dizziness and giddiness: Secondary | ICD-10-CM | POA: Diagnosis not present

## 2016-01-07 DIAGNOSIS — D3002 Benign neoplasm of left kidney: Secondary | ICD-10-CM | POA: Diagnosis not present

## 2016-01-19 DIAGNOSIS — M2550 Pain in unspecified joint: Secondary | ICD-10-CM | POA: Diagnosis not present

## 2016-01-19 DIAGNOSIS — R5383 Other fatigue: Secondary | ICD-10-CM | POA: Diagnosis not present

## 2016-01-19 DIAGNOSIS — M797 Fibromyalgia: Secondary | ICD-10-CM | POA: Diagnosis not present

## 2016-01-19 DIAGNOSIS — R51 Headache: Secondary | ICD-10-CM | POA: Diagnosis not present

## 2016-01-19 DIAGNOSIS — M329 Systemic lupus erythematosus, unspecified: Secondary | ICD-10-CM | POA: Diagnosis not present

## 2016-01-28 DIAGNOSIS — L932 Other local lupus erythematosus: Secondary | ICD-10-CM | POA: Diagnosis not present

## 2016-01-28 DIAGNOSIS — R42 Dizziness and giddiness: Secondary | ICD-10-CM | POA: Diagnosis not present

## 2016-01-28 NOTE — Unmapped (Signed)
Requesting Attending Physician: Cyndi Lennert, MD  Reason for Consult:  Vertigo      Assessment:  50 year old woman with a history of SLE who presents with intermittent vertigo without hearing loss or other associated symptoms.    Plan:  Discussed with patient that there are many causes of dizziness. Some of the most common causes are heart disease (such as carotid artery stenosis), blood pressure problems (such as orthostatic blood pressure), neurologic conditions (such as migraines or strokes), medication issues (such as from blood pressure medications) and inner ear issues (such as viral infections). Dizziness can also be multifactorial.     Patient's dizziness sounds like  Dizziness but not true vertio.     Could consider VNG or vesitbular rehab.   Could consider carotid ultrasound and orthostatic blood pressures. Recommend PCP referral to cardiology.   PCP should discuss medications with patient.          HPI:  Tonya Boyer is a 50 y.o. African-American pleasant female being seen in consultation at the request of Dr. Vonna Kotyk for evaluation and opinion of vertigo.       Answers for HPI/ROS submitted by the patient on 01/27/2016   Neurologic complaint  memory loss: Yes  visual change: Yes  weakness: Yes  Chronicity: recurrent  Onset: more than 1 year ago  Progression since onset: waxing and waning  Focality: no focality noted  abdominal pain: No  auditory change: Yes  aura: Yes  back pain: Yes  bladder incontinence: No  bowel incontinence: No  chest pain: Yes  confusion: No  diaphoresis: Yes  dizziness: Yes  fatigue: Yes  fever: No  headaches: Yes  nausea: Yes  neck pain: Yes  palpitations: No  shortness of breath: Yes  vertigo: Yes  vomiting: No  Treatments tried: acetaminophen, bed rest, medication, sleep    The patient denies fevers, weight loss, SOB, pain, dysphagia, odynophagia, hoarseness, hemoptysis, otalgia, or neck masses/growths.    Past Medical History    Past Medical History: Diagnosis Date   ??? Benign renal tumor    ??? Genital herpes    ??? Hypertension    ??? Lymphadenopathy    ??? Migraine    ??? SLE (systemic lupus erythematosus) (CMS-HCC)    ??? Vitamin D deficiency          Past Surgical History    Past Surgical History:   Procedure Laterality Date   ??? HYSTERECTOMY  2011    fibroids/endometriosis   ??? RENAL MASS EXCISION  2013    benign         Medications      Current Outpatient Prescriptions   Medication Sig Dispense Refill   ??? baclofen (LIORESAL) 10 MG tablet TAKE 1/2 TO 1 TABLET TWICE DAILY AS NEEDED; LIMIT TO 2 HEADACHE DAYS PER WEEK; DO NOT USE DAILY  0   ??? belimumab (BENLYSTA) 200 mg/mL Syrg Inject 200 mg under the skin every seven (7) days. 12 mL 3   ??? cholecalciferol, vitamin D3, 2,000 unit Tab Take 2,000 Units by mouth daily.      ??? clotrimazole (LOTRIMIN) 1 % cream Apply topically Two (2) times a day.     ??? dexamethasone (DECADRON) 10 mg/mL injection Infuse into a venous catheter. Dose unknown, every 2 weeks. Injections. Given with lidocaine and marcaine.     ??? diazePAM (VALIUM) 2 MG tablet Take 2 mg by mouth once as needed for anxiety.     ??? DULoxetine (CYMBALTA) 20 MG capsule  Take 20 mg by mouth daily.  0   ??? ibuprofen (ADVIL,MOTRIN) 200 MG tablet Take 200 mg by mouth continuous as needed for pain.     ??? meclizine (ANTIVERT) 25 mg tablet 1 PO at onset of vertigo, may repeat Q 4-6 hours 30 tablet 1   ??? naproxen sodium (ALEVE) 220 MG tablet Take 220 mg by mouth continuous as needed for pain.     ??? OMEGA3/DHA/EPA/FISH OIL/VIT D3 (FISH OIL-VIT D3 ORAL) Take 500 mg by mouth.     ??? omeprazole (PRILOSEC) 20 MG capsule Take 1 capsule (20 mg total) by mouth daily. 90 capsule 3   ??? oxymetazoline (NASAL RELIEF) 0.05 % nasal spray 2 sprays continuous as needed for congestion.     ??? propylene glycol (SYSTANE BALANCE) 0.6 % Drop Apply to eye continuous as needed.     ??? zonisamide (ZONEGRAN) 25 MG capsule take 4 capsules by mouth at bedtime (TITRATE AS DIRECTED BY PRESCRIBER)  0 No current facility-administered medications for this visit.          Allergies    Amoxicillin; Penicillins; and Latex, natural rubber      Social History:    Tobacco use:  reports that she has never smoked. She has never used smokeless tobacco.  Alcohol use:  has no alcohol history on file.  Drug use:  has no drug history on file.      Family History    The patient's family history includes Cancer in her father..      Review of Systems  A 10 system review of systems was negative except as noted in HPI and intake encounter form, which was reviewed and scanned into the media section of the medical record        OBJECTIVE:  There were no vitals taken for this visit.    General: well appearing, stated age, no distress   Communicates age appropriate, responds to speech well  Head - atraumatic, normocephalic   Face - no surface abnormalities, no tenderness over sinuses   Facial Strength - AD 1/6, AS 1/6  Eyes - no conjunctivitis, no scleritis/iritis, EOM full   Ears - External ear- normal, no lesions, no malformations   Otoscopy - Normal EACs and TMs bilaterally  Nose - external nose without deformity, anterior rhinoscopy - turbinates, mucosa normal   Oral Cavity - lips without lesions, alveolus normal, hard palate normal, mucosa floor of mouth, buccal mucosa, retromolar trigone mucosa all normal   Oropharynx - no mucosal lesions, tonsil, soft palate, posterior pharynx   Salivary Glands - no mass or assymmetry, no tenderness   Neck - no lymphadenopathy, no thyromegaly   Lymphatic - no neck lymphadenopathy   Cardiovascular - heart regular rate and rhythym, no murmurs appreciated  Pulmonary - lungs clear to auscultation bilaterally  Neurologic - cranial nerves 2-12 intact, patient oriented x3, appropriate mood    Audiogram: Normal hearing bilaterally. Type A tympanograms

## 2016-02-08 DIAGNOSIS — Z049 Encounter for examination and observation for unspecified reason: Secondary | ICD-10-CM | POA: Diagnosis not present

## 2016-02-08 DIAGNOSIS — G43719 Chronic migraine without aura, intractable, without status migrainosus: Secondary | ICD-10-CM | POA: Diagnosis not present

## 2016-02-16 DIAGNOSIS — R51 Headache: Secondary | ICD-10-CM | POA: Diagnosis not present

## 2016-02-16 DIAGNOSIS — M797 Fibromyalgia: Secondary | ICD-10-CM | POA: Diagnosis not present

## 2016-02-16 DIAGNOSIS — M329 Systemic lupus erythematosus, unspecified: Secondary | ICD-10-CM | POA: Diagnosis not present

## 2016-02-16 DIAGNOSIS — M2550 Pain in unspecified joint: Secondary | ICD-10-CM | POA: Diagnosis not present

## 2016-02-17 DIAGNOSIS — G5600 Carpal tunnel syndrome, unspecified upper limb: Secondary | ICD-10-CM | POA: Diagnosis not present

## 2016-02-22 DIAGNOSIS — G43719 Chronic migraine without aura, intractable, without status migrainosus: Secondary | ICD-10-CM | POA: Diagnosis not present

## 2016-02-22 DIAGNOSIS — G518 Other disorders of facial nerve: Secondary | ICD-10-CM | POA: Diagnosis not present

## 2016-02-22 DIAGNOSIS — M542 Cervicalgia: Secondary | ICD-10-CM | POA: Diagnosis not present

## 2016-02-22 DIAGNOSIS — M791 Myalgia: Secondary | ICD-10-CM | POA: Diagnosis not present

## 2016-03-01 DIAGNOSIS — G5600 Carpal tunnel syndrome, unspecified upper limb: Secondary | ICD-10-CM | POA: Diagnosis not present

## 2016-03-08 DIAGNOSIS — G518 Other disorders of facial nerve: Secondary | ICD-10-CM | POA: Diagnosis not present

## 2016-03-08 DIAGNOSIS — M542 Cervicalgia: Secondary | ICD-10-CM | POA: Diagnosis not present

## 2016-03-08 DIAGNOSIS — M791 Myalgia: Secondary | ICD-10-CM | POA: Diagnosis not present

## 2016-03-08 DIAGNOSIS — G43719 Chronic migraine without aura, intractable, without status migrainosus: Secondary | ICD-10-CM | POA: Diagnosis not present

## 2016-03-23 DIAGNOSIS — M791 Myalgia: Secondary | ICD-10-CM | POA: Diagnosis not present

## 2016-03-23 DIAGNOSIS — G43719 Chronic migraine without aura, intractable, without status migrainosus: Secondary | ICD-10-CM | POA: Diagnosis not present

## 2016-03-23 DIAGNOSIS — G518 Other disorders of facial nerve: Secondary | ICD-10-CM | POA: Diagnosis not present

## 2016-03-23 DIAGNOSIS — M542 Cervicalgia: Secondary | ICD-10-CM | POA: Diagnosis not present

## 2016-03-28 DIAGNOSIS — Z01419 Encounter for gynecological examination (general) (routine) without abnormal findings: Secondary | ICD-10-CM | POA: Diagnosis not present

## 2016-03-28 DIAGNOSIS — Z6826 Body mass index (BMI) 26.0-26.9, adult: Secondary | ICD-10-CM | POA: Diagnosis not present

## 2016-03-28 DIAGNOSIS — Z1231 Encounter for screening mammogram for malignant neoplasm of breast: Secondary | ICD-10-CM | POA: Diagnosis not present

## 2016-04-06 DIAGNOSIS — G43719 Chronic migraine without aura, intractable, without status migrainosus: Secondary | ICD-10-CM | POA: Diagnosis not present

## 2016-04-06 DIAGNOSIS — G518 Other disorders of facial nerve: Secondary | ICD-10-CM | POA: Diagnosis not present

## 2016-04-06 DIAGNOSIS — M542 Cervicalgia: Secondary | ICD-10-CM | POA: Diagnosis not present

## 2016-04-06 DIAGNOSIS — M791 Myalgia: Secondary | ICD-10-CM | POA: Diagnosis not present

## 2016-04-25 DIAGNOSIS — G43719 Chronic migraine without aura, intractable, without status migrainosus: Secondary | ICD-10-CM | POA: Diagnosis not present

## 2016-04-25 DIAGNOSIS — G518 Other disorders of facial nerve: Secondary | ICD-10-CM | POA: Diagnosis not present

## 2016-04-25 DIAGNOSIS — M791 Myalgia: Secondary | ICD-10-CM | POA: Diagnosis not present

## 2016-04-25 DIAGNOSIS — M542 Cervicalgia: Secondary | ICD-10-CM | POA: Diagnosis not present

## 2016-05-05 DIAGNOSIS — M329 Systemic lupus erythematosus, unspecified: Secondary | ICD-10-CM | POA: Diagnosis not present

## 2016-05-05 DIAGNOSIS — Z6824 Body mass index (BMI) 24.0-24.9, adult: Secondary | ICD-10-CM | POA: Diagnosis not present

## 2016-05-05 DIAGNOSIS — F419 Anxiety disorder, unspecified: Secondary | ICD-10-CM | POA: Diagnosis not present

## 2016-05-05 DIAGNOSIS — Z1389 Encounter for screening for other disorder: Secondary | ICD-10-CM | POA: Diagnosis not present

## 2016-05-05 DIAGNOSIS — M940 Chondrocostal junction syndrome [Tietze]: Secondary | ICD-10-CM | POA: Diagnosis not present

## 2016-05-05 DIAGNOSIS — M797 Fibromyalgia: Secondary | ICD-10-CM | POA: Diagnosis not present

## 2016-05-06 DIAGNOSIS — R079 Chest pain, unspecified: Secondary | ICD-10-CM | POA: Diagnosis not present

## 2016-05-06 DIAGNOSIS — Z6825 Body mass index (BMI) 25.0-25.9, adult: Secondary | ICD-10-CM | POA: Diagnosis not present

## 2016-05-06 DIAGNOSIS — K210 Gastro-esophageal reflux disease with esophagitis: Secondary | ICD-10-CM | POA: Diagnosis not present

## 2016-05-06 DIAGNOSIS — I341 Nonrheumatic mitral (valve) prolapse: Secondary | ICD-10-CM | POA: Diagnosis not present

## 2016-05-06 DIAGNOSIS — I10 Essential (primary) hypertension: Secondary | ICD-10-CM | POA: Diagnosis not present

## 2016-05-06 DIAGNOSIS — R0789 Other chest pain: Secondary | ICD-10-CM | POA: Diagnosis not present

## 2016-05-17 DIAGNOSIS — E049 Nontoxic goiter, unspecified: Secondary | ICD-10-CM | POA: Diagnosis not present

## 2016-05-18 ENCOUNTER — Other Ambulatory Visit: Payer: Self-pay | Admitting: Endocrinology

## 2016-05-18 DIAGNOSIS — E049 Nontoxic goiter, unspecified: Secondary | ICD-10-CM

## 2016-05-20 DIAGNOSIS — G43719 Chronic migraine without aura, intractable, without status migrainosus: Secondary | ICD-10-CM | POA: Diagnosis not present

## 2016-05-20 DIAGNOSIS — G518 Other disorders of facial nerve: Secondary | ICD-10-CM | POA: Diagnosis not present

## 2016-05-20 DIAGNOSIS — M791 Myalgia: Secondary | ICD-10-CM | POA: Diagnosis not present

## 2016-05-20 DIAGNOSIS — M542 Cervicalgia: Secondary | ICD-10-CM | POA: Diagnosis not present

## 2016-06-02 ENCOUNTER — Other Ambulatory Visit: Payer: BLUE CROSS/BLUE SHIELD

## 2016-06-20 DIAGNOSIS — M791 Myalgia: Secondary | ICD-10-CM | POA: Diagnosis not present

## 2016-06-20 DIAGNOSIS — G518 Other disorders of facial nerve: Secondary | ICD-10-CM | POA: Diagnosis not present

## 2016-06-20 DIAGNOSIS — M542 Cervicalgia: Secondary | ICD-10-CM | POA: Diagnosis not present

## 2016-06-20 DIAGNOSIS — G43719 Chronic migraine without aura, intractable, without status migrainosus: Secondary | ICD-10-CM | POA: Diagnosis not present

## 2016-06-27 ENCOUNTER — Ambulatory Visit
Admission: RE | Admit: 2016-06-27 | Discharge: 2016-06-27 | Disposition: A | Discharge status: Home / Self Care | Payer: BLUE CROSS/BLUE SHIELD | Source: Ambulatory Visit | Attending: Endocrinology | Admitting: Endocrinology

## 2016-06-27 DIAGNOSIS — E041 Nontoxic single thyroid nodule: Secondary | ICD-10-CM | POA: Diagnosis not present

## 2016-06-27 DIAGNOSIS — E049 Nontoxic goiter, unspecified: Secondary | ICD-10-CM

## 2016-07-12 DIAGNOSIS — Z6825 Body mass index (BMI) 25.0-25.9, adult: Secondary | ICD-10-CM | POA: Diagnosis not present

## 2016-07-12 DIAGNOSIS — G479 Sleep disorder, unspecified: Secondary | ICD-10-CM | POA: Diagnosis not present

## 2016-07-12 DIAGNOSIS — M329 Systemic lupus erythematosus, unspecified: Secondary | ICD-10-CM | POA: Diagnosis not present

## 2016-07-12 DIAGNOSIS — Z79899 Other long term (current) drug therapy: Secondary | ICD-10-CM | POA: Diagnosis not present

## 2016-07-12 DIAGNOSIS — J340 Abscess, furuncle and carbuncle of nose: Secondary | ICD-10-CM | POA: Diagnosis not present

## 2016-07-12 DIAGNOSIS — G3184 Mild cognitive impairment, so stated: Secondary | ICD-10-CM | POA: Diagnosis not present

## 2016-07-12 DIAGNOSIS — F329 Major depressive disorder, single episode, unspecified: Secondary | ICD-10-CM | POA: Diagnosis not present

## 2016-07-12 DIAGNOSIS — F419 Anxiety disorder, unspecified: Secondary | ICD-10-CM | POA: Diagnosis not present

## 2016-07-12 DIAGNOSIS — R002 Palpitations: Secondary | ICD-10-CM | POA: Diagnosis not present

## 2016-07-12 DIAGNOSIS — E559 Vitamin D deficiency, unspecified: Secondary | ICD-10-CM | POA: Diagnosis not present

## 2016-07-12 DIAGNOSIS — M797 Fibromyalgia: Secondary | ICD-10-CM | POA: Diagnosis not present

## 2016-07-12 DIAGNOSIS — M3500 Sicca syndrome, unspecified: Secondary | ICD-10-CM | POA: Diagnosis not present

## 2016-07-12 DIAGNOSIS — E042 Nontoxic multinodular goiter: Secondary | ICD-10-CM | POA: Diagnosis not present

## 2016-07-12 DIAGNOSIS — Z7952 Long term (current) use of systemic steroids: Secondary | ICD-10-CM | POA: Diagnosis not present

## 2016-07-12 DIAGNOSIS — I059 Rheumatic mitral valve disease, unspecified: Secondary | ICD-10-CM | POA: Diagnosis not present

## 2016-07-21 DIAGNOSIS — M329 Systemic lupus erythematosus, unspecified: Secondary | ICD-10-CM | POA: Diagnosis not present

## 2016-07-21 DIAGNOSIS — Z23 Encounter for immunization: Secondary | ICD-10-CM | POA: Diagnosis not present

## 2016-07-21 DIAGNOSIS — Z1389 Encounter for screening for other disorder: Secondary | ICD-10-CM | POA: Diagnosis not present

## 2016-07-21 DIAGNOSIS — Z0001 Encounter for general adult medical examination with abnormal findings: Secondary | ICD-10-CM | POA: Diagnosis not present

## 2016-07-21 DIAGNOSIS — R030 Elevated blood-pressure reading, without diagnosis of hypertension: Secondary | ICD-10-CM | POA: Diagnosis not present

## 2016-07-21 DIAGNOSIS — E049 Nontoxic goiter, unspecified: Secondary | ICD-10-CM | POA: Diagnosis not present

## 2016-07-21 DIAGNOSIS — Z6826 Body mass index (BMI) 26.0-26.9, adult: Secondary | ICD-10-CM | POA: Diagnosis not present

## 2016-07-21 DIAGNOSIS — G43909 Migraine, unspecified, not intractable, without status migrainosus: Secondary | ICD-10-CM | POA: Diagnosis not present

## 2016-08-01 DIAGNOSIS — G43719 Chronic migraine without aura, intractable, without status migrainosus: Secondary | ICD-10-CM | POA: Diagnosis not present

## 2016-08-01 DIAGNOSIS — M542 Cervicalgia: Secondary | ICD-10-CM | POA: Diagnosis not present

## 2016-08-01 DIAGNOSIS — M791 Myalgia: Secondary | ICD-10-CM | POA: Diagnosis not present

## 2016-08-01 DIAGNOSIS — G518 Other disorders of facial nerve: Secondary | ICD-10-CM | POA: Diagnosis not present

## 2016-08-02 ENCOUNTER — Telehealth: Payer: Self-pay

## 2016-08-02 NOTE — Telephone Encounter (Signed)
Patient called and left message. She wants to schedule a follow-up appt with Dr. Alvy Bimler.  She said her PCP wants her to follow up with Dr. Alvy Bimler.

## 2016-08-02 NOTE — Telephone Encounter (Signed)
I will send scheduling msg 

## 2016-08-11 ENCOUNTER — Telehealth: Payer: Self-pay | Admitting: Hematology and Oncology

## 2016-08-11 NOTE — Telephone Encounter (Signed)
Spoke with patient re 6/19 lab/fu.

## 2016-08-29 ENCOUNTER — Other Ambulatory Visit: Payer: Self-pay | Admitting: Hematology and Oncology

## 2016-08-29 DIAGNOSIS — D638 Anemia in other chronic diseases classified elsewhere: Secondary | ICD-10-CM

## 2016-08-29 DIAGNOSIS — D72818 Other decreased white blood cell count: Secondary | ICD-10-CM

## 2016-08-30 ENCOUNTER — Ambulatory Visit: Payer: BLUE CROSS/BLUE SHIELD | Admitting: Hematology and Oncology

## 2016-08-30 ENCOUNTER — Other Ambulatory Visit: Payer: BLUE CROSS/BLUE SHIELD

## 2016-08-30 ENCOUNTER — Encounter: Payer: Self-pay | Admitting: Hematology and Oncology

## 2016-08-30 NOTE — Progress Notes (Signed)
Returned call from voicemail left via operator yesterday afternoon stating patient has some insurance/registration questions. Left voicemail today with my contact name and number.

## 2016-09-02 ENCOUNTER — Emergency Department (HOSPITAL_COMMUNITY)
Admission: EM | Admit: 2016-09-02 | Discharge: 2016-09-02 | Disposition: A | Discharge status: Home / Self Care | Payer: BLUE CROSS/BLUE SHIELD | Attending: Emergency Medicine | Admitting: Emergency Medicine

## 2016-09-02 ENCOUNTER — Encounter (HOSPITAL_COMMUNITY): Payer: Self-pay | Admitting: Emergency Medicine

## 2016-09-02 DIAGNOSIS — Z79899 Other long term (current) drug therapy: Secondary | ICD-10-CM | POA: Diagnosis not present

## 2016-09-02 DIAGNOSIS — S0990XA Unspecified injury of head, initial encounter: Secondary | ICD-10-CM | POA: Diagnosis not present

## 2016-09-02 DIAGNOSIS — S0003XA Contusion of scalp, initial encounter: Secondary | ICD-10-CM | POA: Diagnosis not present

## 2016-09-02 DIAGNOSIS — I10 Essential (primary) hypertension: Secondary | ICD-10-CM | POA: Diagnosis not present

## 2016-09-02 DIAGNOSIS — R51 Headache: Secondary | ICD-10-CM | POA: Insufficient documentation

## 2016-09-02 MED ORDER — ACETAMINOPHEN 500 MG PO TABS
1000.0000 mg | ORAL_TABLET | Freq: Once | ORAL | Status: AC
  Administered 2016-09-02: 1000 mg via ORAL
  Filled 2016-09-02: qty 2

## 2016-09-02 NOTE — Discharge Instructions (Signed)
It was our pleasure to provide your ER care today - we hope that you feel better.  Rest. Drink adequate fluids.   Take acetaminophen and ibuprofen as need for pain.   Follow up with primary care doctor in the next few days if symptoms fail to improve/resolve.  Return to ER if worse, new symptoms, severe head pain, vomiting, other concern.
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
## 2016-09-02 NOTE — ED Notes (Signed)
Water given  

## 2016-09-02 NOTE — ED Provider Notes (Signed)
[facility] DEPT Provider Note   CSN: [id number] Arrival date & time: 09/02/16  1159     History   Chief Complaint Chief Complaint  Patient presents with  . Head Injury    HPI Dawn [facility] is a 51 y.o. female.  Patient c/o accidentally getting hit in head shortly prior to arrival today.  Was cleaning car, seat swung back and hit left side of head. No loc. Gradual onset dull headache afterwards. Transiently felt dizzy. No loc. No severe head pain. No nausea or vomiting. No neck or back pain. Denies other pain or injury. No associated numbness/weakness, change in speech or vision, or change in normal functional ability. No anticoag use.    The history is provided by the patient.  Head Injury   Pertinent negatives include no numbness, no vomiting and no weakness.    Past Medical History:  Diagnosis Date  . Anemia of other chronic disease 12/27/2012  . Anxiety   . Chest pain    constant-ongoing for years--sometimes eases up--but always comes back  . GERD (gastroesophageal reflux disease)    burning in stomach and gas--no med  . Goiter    sometimes trouble swallowing  . Heart murmur   . 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 Hypertension   . Left renal mass   . Leukopenia 12/27/2012  . Lupus   . Migraine   . Nontoxic simple goitre 12/27/2012  . Rhinitis, allergic    seasonal  . Seizures (HCC)    migraines  . SLE (systemic lupus erythematosus) ([facility]) 12/27/2012  . Vertigo     Patient Active Problem List   Diagnosis Date Noted  . Lymphadenopathy of head and neck 09/12/2014  . Dysuria 09/12/2014  . Elevated alkaline phosphatase measurement 12/31/2013  . SLE (systemic lupus erythematosus) ([place]) 12/27/2012  . Nontoxic simple goitre 12/27/2012  . 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 Leukopenia 12/27/2012  . Anemia in chronic illness 12/27/2012  . Renal mass, left 06/25/2011  . Vertigo   . Migraine     Past Surgical History:  Procedure Laterality Date  .  ABDOMINAL HYSTERECTOMY    . ABDOMINAL HYSTERECTOMY  2011  . KIDNEY SURGERY    . MYOMECTOMY ABDOMINAL APPROACH    . myomectomy x 2 (prior to the hysterectomy)  2002 and 2006  . TUMOR REMOVAL      OB History    Gravida Para Term Preterm AB Living   0 0 0 0 0     SAB TAB Ectopic Multiple Live Births   0 0 0           Home Medications    Prior to Admission medications   Medication Sig Start Date End Date Taking? Authorizing Provider  acetaminophen (TYLENOL) 500 MG tablet Take 500 mg by mouth every 6 (six) hours as needed.    [provider]  azaTHIOprine (IMURAN) 50 MG tablet Take 50 mg by mouth daily. 08/26/14   [provider]  azaTHIOprine (IMURAN) 50 MG tablet Take 50 mg by mouth daily. 07/08/14   [provider]  diazepam (VALIUM) 2 MG tablet Take 2 mg by mouth every 8 (eight) hours as needed. 1/2 TAB AS NEEDED. 08/22/14   [provider]  diazepam (VALIUM) 2 MG tablet Take 1 mg by mouth daily as needed for anxiety.    [provider]  Vitamin D, Ergocalciferol, (DRISDOL) 50000 UNITS CAPS capsule Take 50,000 Units by mouth every 30 (thirty) days.     [provider]  Vitamin D, Ergocalciferol, (  DRISDOL) 50000 UNITS CAPS capsule Take 50,000 Units by mouth every 7 (seven) days.    [provider]    Family History History reviewed. No pertinent family history.  Social History Social History  Substance Use Topics  . Smoking status: Never Smoker  . Smokeless tobacco: Never Used  . Alcohol use Yes     Comment: occas-maybe once a month     Allergies   Amoxicillin; Amoxil [amoxicillin trihydrate]; Penicillins; Penicillins; and Latex   Review of Systems Review of Systems  Constitutional: Negative for fever.  HENT: Negative for sore throat.   Eyes: Negative for pain and visual disturbance.  Respiratory: Negative for shortness of breath.   Cardiovascular: Negative for chest pain.  Gastrointestinal: Negative for  abdominal pain, nausea and vomiting.  Genitourinary: Negative for flank pain.  Musculoskeletal: Negative for back pain and neck pain.  Skin: Negative for wound.  Neurological: Positive for headaches. Negative for weakness and numbness.  Hematological: Does not bruise/bleed easily.  Psychiatric/Behavioral: Negative for confusion.     Physical Exam Updated Vital Signs BP 138/87 (BP Location: Left Arm)   Pulse 97   Temp 98 F (36.7 C) (Oral)   Resp 18   Ht 1.651 m (5\' 5" )   Wt 68 kg (150 lb)   SpO2 100%   BMI 24.96 kg/m   Physical Exam  Constitutional: She is oriented to person, place, and time. She appears well-developed and well-nourished. No distress.  HENT:  Right Ear: External ear normal.  Left Ear: External ear normal.  Nose: Nose normal.  Mouth/Throat: Oropharynx is clear and moist.  No sinus or temporal tenderness.  Minimal tenderness left scalp, no sts noted.   Eyes: Conjunctivae and EOM are normal. Pupils are equal, round, and reactive to light. No scleral icterus.  Neck: Neck supple. No tracheal deviation present. No thyromegaly present.  No stiffness or rigidity.   Cardiovascular: Normal rate, regular rhythm, normal heart sounds and intact distal pulses.  Exam reveals no gallop and no friction rub.   No murmur heard. Pulmonary/Chest: Effort normal and breath sounds normal. No respiratory distress.  Abdominal: Soft. Normal appearance and bowel sounds are normal. She exhibits no distension. There is no tenderness.  Genitourinary:  Genitourinary Comments: No cva tenderness.  Musculoskeletal: Normal range of motion. She exhibits no edema or tenderness.  CTLS spine, non tender, aligned, no step off.   Neurological: She is alert and oriented to person, place, and time. No cranial nerve deficit.  Speech clear/fluent. Motor intact bilaterally. Steady gait.   Skin: Skin is warm and dry. No rash noted. She is not diaphoretic.  Psychiatric: She has a normal mood and  affect.  Nursing note and vitals reviewed.    ED Treatments / Results  Labs (all labs ordered are listed, but only abnormal results are displayed) Labs Reviewed - No data to display  EKG  EKG Interpretation None       Radiology No results found.  Procedures Procedures (including critical care time)  Medications Ordered in ED Medications  acetaminophen (TYLENOL) tablet 1,000 mg (not administered)     Initial Impression / Assessment and Plan / ED Course  I have reviewed the triage vital signs and the nursing notes.  Pertinent labs & imaging results that were available during my care of the patient were reviewed by me and considered in my medical decision making (see chart for details).  No meds prior to arrival or today for pain.   Acetaminophen po.  Po fluids.  No loc, no nv, normal neuro exam - no indication for head ct.  Patient appears stable for d/c.     Final Clinical Impressions(s) / ED Diagnoses   Final diagnoses:  None    New Prescriptions New Prescriptions   No medications on file     Lajean Saver, MD 09/02/16 1242

## 2016-09-16 MED FILL — BENLYSTA/200MG/ML/SOAJ: BENLYSTA/200MG/ML/SOAJ | 30 days supply | Qty: 4 | Fill #2

## 2016-09-26 ENCOUNTER — Telehealth: Payer: Self-pay | Admitting: Hematology and Oncology

## 2016-09-26 ENCOUNTER — Other Ambulatory Visit (HOSPITAL_BASED_OUTPATIENT_CLINIC_OR_DEPARTMENT_OTHER): Payer: BLUE CROSS/BLUE SHIELD

## 2016-09-26 ENCOUNTER — Ambulatory Visit (HOSPITAL_BASED_OUTPATIENT_CLINIC_OR_DEPARTMENT_OTHER): Payer: BLUE CROSS/BLUE SHIELD | Admitting: Hematology and Oncology

## 2016-09-26 DIAGNOSIS — D72818 Other decreased white blood cell count: Secondary | ICD-10-CM

## 2016-09-26 DIAGNOSIS — D72819 Decreased white blood cell count, unspecified: Secondary | ICD-10-CM | POA: Diagnosis not present

## 2016-09-26 DIAGNOSIS — M329 Systemic lupus erythematosus, unspecified: Secondary | ICD-10-CM | POA: Diagnosis not present

## 2016-09-26 DIAGNOSIS — N2889 Other specified disorders of kidney and ureter: Secondary | ICD-10-CM

## 2016-09-26 DIAGNOSIS — D638 Anemia in other chronic diseases classified elsewhere: Secondary | ICD-10-CM

## 2016-09-26 LAB — CBC WITH DIFFERENTIAL/PLATELET
BASO%: 0.7 % (ref 0.0–2.0)
Basophils Absolute: 0 10*3/uL (ref 0.0–0.1)
EOS%: 2.4 % (ref 0.0–7.0)
Eosinophils Absolute: 0.1 10*3/uL (ref 0.0–0.5)
HCT: 37.9 % (ref 34.8–46.6)
HEMOGLOBIN: 12.6 g/dL (ref 11.6–15.9)
LYMPH#: 1.2 10*3/uL (ref 0.9–3.3)
LYMPH%: 41.5 % (ref 14.0–49.7)
MCH: 31.2 pg (ref 25.1–34.0)
MCHC: 33.2 g/dL (ref 31.5–36.0)
MCV: 93.8 fL (ref 79.5–101.0)
MONO#: 0.7 10*3/uL (ref 0.1–0.9)
MONO%: 22.5 % — ABNORMAL HIGH (ref 0.0–14.0)
NEUT#: 1 10*3/uL — ABNORMAL LOW (ref 1.5–6.5)
NEUT%: 32.9 % — AB (ref 38.4–76.8)
Platelets: 235 10*3/uL (ref 145–400)
RBC: 4.04 10*6/uL (ref 3.70–5.45)
RDW: 12.7 % (ref 11.2–14.5)
WBC: 2.9 10*3/uL — ABNORMAL LOW (ref 3.9–10.3)

## 2016-09-26 NOTE — Assessment & Plan Note (Signed)
She has chronic SLE and I think that causes chronic intermittent lymphadenopathy and leukopenia. She appears to be responding well to treatment I would defer to her rheumatologist for further management

## 2016-09-26 NOTE — Telephone Encounter (Signed)
Scheduled appt per 7/16 los - Gave patient AVS and calender per los.  

## 2016-09-26 NOTE — Assessment & Plan Note (Signed)
She has chronic intermittent leukopenia. It could be combination from her treatment for SLE, the SLE itself and also constitutional from her African-American heritage. She is no longer taking azathioprine Despite chronic leukopenia, she is relatively asymptomatic without the need for chronic antibiotic treatment. I recommend close observation only.

## 2016-09-26 NOTE — Assessment & Plan Note (Signed)
She had history of renal mass status post ablation. Plan to repeat CT scan next year for further follow-up.  So far, she had no signs or symptoms of flank pain.

## 2016-09-26 NOTE — Progress Notes (Signed)
[facility] OFFICE PROGRESS NOTE  [name], MD SUMMARY OF HEMATOLOGIC HISTORY:  This patient had background history of systemic lupus. She had history of severe, progressive pancytopenia with low white count and low hemoglobin. Methotrexate was discontinued and her medication was changed to [place]. Subsequently, she changed her rheumatologist and she was placed on methotrexate again, 15 mg weekly since July 2015, off by the end of 2015 Her treatment was subsequently switched to Imuran since May 2016 The patient had prior diagnosis of kidney cancer status post ablation. She underwent CT scan at the body in July 2016, MR of the head and evaluation of lymphadenopathy which came back consistent with reactive LN.  INTERVAL HISTORY: Dawn Marsh 51 y.o. female returns for further follow-up. She is doing well. She denies recent infection. She is doing well with new treatment for SLE. She is no longer on Imuran. She denies the need for antibiotic treatment of recurrent infection.  I have reviewed the past medical history, past surgical history, social history and family history with the patient and they are unchanged from previous note.  ALLERGIES:  is allergic to amoxicillin; amoxil [amoxicillin trihydrate]; penicillins; penicillins; and latex.  MEDICATIONS:  Current Outpatient Prescriptions  Medication Sig Dispense Refill  . acetaminophen (TYLENOL) 500 MG tablet Take 500 mg by mouth every 6 (six) hours as needed.    . belimumab (BENLYSTA) 120 MG SOLR injection Inject 1 mL into the vein once a week.    Marland Kitchen dexamethasone (DECADRON) 10 MG/ML injection Inject 1 mL into the vein every 6 (six) weeks.    . diazepam (VALIUM) 2 MG tablet Take 2 mg by mouth every 8 (eight) hours as needed. 1/2 TAB AS NEEDED.  0  . DULoxetine (CYMBALTA) 20 MG capsule Take 20 mg by mouth daily.    . Vitamin D, Ergocalciferol, (DRISDOL) 50000 UNITS CAPS capsule Take 50,000 Units by mouth every 7  (seven) days.     No current facility-administered medications for this visit.      REVIEW OF SYSTEMS:   Constitutional: Denies fevers, chills or night sweats Eyes: Denies blurriness of vision Ears, nose, mouth, throat, and face: Denies mucositis or sore throat Respiratory: Denies cough, dyspnea or wheezes Cardiovascular: Denies palpitation, chest discomfort or lower extremity swelling Gastrointestinal:  Denies nausea, heartburn or change in bowel habits Skin: Denies abnormal skin rashes Lymphatics: Denies new lymphadenopathy or easy bruising Neurological:Denies numbness, tingling or new weaknesses Behavioral/Psych: Mood is stable, no new changes  All other systems were reviewed with the patient and are negative.  PHYSICAL EXAMINATION: ECOG PERFORMANCE STATUS: 0 - Asymptomatic  Vitals:   09/26/16 0817  BP: (!) 139/94  Pulse: 74  Resp: 20  Temp: 98 F (36.7 C)   Filed Weights   09/26/16 0817  Weight: 145 lb 9.6 oz (66 kg)    GENERAL:alert, no distress and comfortable SKIN: skin color, texture, turgor are normal, no rashes or significant lesions EYES: normal, Conjunctiva are pink and non-injected, sclera clear OROPHARYNX:no exudate, no erythema and lips, buccal mucosa, and tongue normal  NECK: supple, thyroid normal size, non-tender, without nodularity LYMPH:  no palpable lymphadenopathy in the cervical, axillary or inguinal LUNGS: clear to auscultation and percussion with normal breathing effort HEART: regular rate & rhythm and no murmurs and no lower extremity edema ABDOMEN:abdomen soft, non-tender and normal bowel sounds Musculoskeletal:no cyanosis of digits and no clubbing  NEURO: alert & oriented x 3 with fluent speech, no focal motor/sensory deficits  LABORATORY DATA:  I  have reviewed the data as listed     Component Value Date/Time   NA 141 09/10/2014 1445   K 3.9 09/10/2014 1445   CL 106 08/15/2014 0848   CO2 26 09/10/2014 1445   GLUCOSE 101 09/10/2014  1445   BUN 15.4 09/10/2014 1445   CREATININE 0.8 09/10/2014 1445   CALCIUM 9.3 09/10/2014 1445   PROT 8.3 09/10/2014 1445   ALBUMIN 4.0 09/10/2014 1445   AST 24 09/10/2014 1445   ALT 18 09/10/2014 1445   ALKPHOS 162 (H) 09/10/2014 1445   BILITOT 0.50 09/10/2014 1445   GFRNONAA >60 08/15/2014 0848   GFRAA >60 08/15/2014 0848    No results found for: SPEP, UPEP  Lab Results  Component Value Date   WBC 2.9 (L) 09/26/2016   NEUTROABS 1.0 (L) 09/26/2016   HGB 12.6 09/26/2016   HCT 37.9 09/26/2016   MCV 93.8 09/26/2016   PLT 235 09/26/2016      Chemistry      Component Value Date/Time   NA 141 09/10/2014 1445   K 3.9 09/10/2014 1445   CL 106 08/15/2014 0848   CO2 26 09/10/2014 1445   BUN 15.4 09/10/2014 1445   CREATININE 0.8 09/10/2014 1445      Component Value Date/Time   CALCIUM 9.3 09/10/2014 1445   ALKPHOS 162 (H) 09/10/2014 1445   AST 24 09/10/2014 1445   ALT 18 09/10/2014 1445   BILITOT 0.50 09/10/2014 1445       ASSESSMENT & PLAN:  SLE (systemic lupus erythematosus) She has chronic SLE and I think that causes chronic intermittent lymphadenopathy and leukopenia. She appears to be responding well to treatment I would defer to her rheumatologist for further management  Leukopenia She has chronic intermittent leukopenia. It could be combination from her treatment for SLE, the SLE itself and also constitutional from her African-American heritage. She is no longer taking azathioprine Despite chronic leukopenia, she is relatively asymptomatic without the need for chronic antibiotic treatment. I recommend close observation only.  Renal mass, left She had history of renal mass status post ablation. Plan to repeat CT scan next year for further follow-up.  So far, she had no signs or symptoms of flank pain.   Orders Placed This Encounter  Procedures  . CT ABDOMEN PELVIS W CONTRAST    Standing Status:   Future    Standing Expiration Date:   12/27/2017     Order Specific Question:   Reason for Exam (SYMPTOM  OR DIAGNOSIS REQUIRED)    Answer:   kidney mass s/p ablation    Order Specific Question:   Is the patient pregnant?    Answer:   No    Order Specific Question:   Preferred imaging location?    Answer:   [facility]  . CBC with Differential/Platelet    Standing Status:   Future    Standing Expiration Date:   10/31/2017  . Comprehensive metabolic panel    Standing Status:   Future    Standing Expiration Date:   10/31/2017    All questions were answered. The patient knows to call the clinic with any problems, questions or concerns. No barriers to learning was detected.  I spent 10 minutes counseling the patient face to face. The total time spent in the appointment was 15 minutes and more than 50% was on counseling.     Heath Lark, MD 7/16/20182:11 PM

## 2016-09-27 LAB — SEDIMENTATION RATE: Sedimentation Rate-Westergren: 3 mm/hr (ref 0–32)

## 2016-10-03 NOTE — Unmapped (Signed)
Specialty Pharmacy Refill Coordination Note     Tonya Boyer is a 51 y.o. female contacted today regarding refills of her specialty medication(s).    Reviewed and verified with patient:      Specialty medication(s) and dose(s) confirmed: yes  Changes to medications: no  Changes to insurance: no    Medication Adherence    Patient reported X missed doses in the last month:  0  Specialty Medication:  BENLYSTA  Informant:  patient  Medication Assistance Program  Refill Coordination  Has the Patient's Contact Information Changed:  No  Is the Shipping Address Different:  No  Shipping Information  Delivery Scheduled:  Yes  Delivery Date:  10/18/16  Medications to be Shipped:  BENLYSTA DELIVER 10/18/16 INJECT 10/21/16          Follow-up: 3 week(s)     Roselyn Meier  Specialty Pharmacy Technician

## 2016-10-14 NOTE — Unmapped (Signed)
Specialty Pharmacy - [facility] Rheumatology Assessment Telephone Call     Elisabel Hanover is a 51 y.o. female contacted today regarding assessment of her specialty medication(s): Benlysta    Reviewed and verified with patient:      Specialty medication(s) and dose(s) confirmed: yes  Changes to medications: no  Changes to insurance: no    Medication Adherence    Patient reported X missed doses in the last month:  0  Specialty Medication:  Benlysta  Patient is on additional specialty medications:  No  Informant:  patient  Provider-estimated medication adherence level:  good  Patient is at risk for Non-Adherence:  No  Medication Assistance Program  Refill Coordination  Shipping Information  Delivery Scheduled:  No       Adverse Effects    *All other systems reviewed and are negative         Latonja Bobeck reports doing well on Benlysta without any lupus flares.  No side effects from medication. No concerns/issues at this time.  Next appt with Dr.Gilbert in November.      All questions were answered and contact information provided for any future questions/concerns.      Jeneen Montgomery

## 2016-10-17 MED FILL — BENLYSTA/200MG/ML/SOAJ: BENLYSTA/200MG/ML/SOAJ | 30 days supply | Qty: 4 | Fill #3

## 2016-10-21 DIAGNOSIS — Z1389 Encounter for screening for other disorder: Secondary | ICD-10-CM | POA: Diagnosis not present

## 2016-10-21 DIAGNOSIS — I10 Essential (primary) hypertension: Secondary | ICD-10-CM | POA: Diagnosis not present

## 2016-10-21 DIAGNOSIS — L732 Hidradenitis suppurativa: Secondary | ICD-10-CM | POA: Diagnosis not present

## 2016-10-21 DIAGNOSIS — M797 Fibromyalgia: Secondary | ICD-10-CM | POA: Diagnosis not present

## 2016-10-21 DIAGNOSIS — R42 Dizziness and giddiness: Secondary | ICD-10-CM | POA: Diagnosis not present

## 2016-10-21 DIAGNOSIS — M329 Systemic lupus erythematosus, unspecified: Secondary | ICD-10-CM | POA: Diagnosis not present

## 2016-10-21 DIAGNOSIS — Z6824 Body mass index (BMI) 24.0-24.9, adult: Secondary | ICD-10-CM | POA: Diagnosis not present

## 2016-11-07 NOTE — Unmapped (Signed)
[facility] Specialty Pharmacy Refill Coordination Note  Specialty Medication(s): ZOXWRUEA  Additional Medications shipped: Tonya Boyer, DOB: [birth year]  Phone: [phone] (home) , Alternate phone contact: N/A  Phone or address changes today?: No  All above HIPAA information was verified with patient.  Shipping Address: [address]  [place] [address]   Insurance changes? No    Completed refill call assessment today to schedule patient's medication shipment from the [facility] [phone]).      Confirmed the medication and dosage are correct and have not changed: Yes, regimen is correct and unchanged.    Confirmed patient started or stopped the following medications in the past month:  Physician started patient on cephalexin for skin boils/eruptions    Are you tolerating your medication?:  Unknown reports tolerating the medication. She has been having skin boils/eruptions that her primary care physician has been treating with cephalexin - since she states it is no better I advised her to contact physician again. She also states has been having more bleeding/bruising lately - again advised her to contact physician. She said she would call as soon as she got of the phone with me today.    ADHERENCE      Did you miss any doses in the past 4 weeks? No missed doses reported.    FINANCIAL/SHIPPING    Delivery Scheduled: Yes, Expected medication delivery date: 11/11/2016     Tonya Boyer did not have any additional questions at this time.    Delivery address validated in [facility] scheduling system: Yes, address listed in [facility] is correct.    We will follow up with patient monthly for standard refill processing and delivery.      Thank you,  Thad Ranger   [facility] Specialty Pharmacist

## 2016-11-15 DIAGNOSIS — M542 Cervicalgia: Secondary | ICD-10-CM | POA: Diagnosis not present

## 2016-11-15 DIAGNOSIS — G518 Other disorders of facial nerve: Secondary | ICD-10-CM | POA: Diagnosis not present

## 2016-11-15 DIAGNOSIS — G43719 Chronic migraine without aura, intractable, without status migrainosus: Secondary | ICD-10-CM | POA: Diagnosis not present

## 2016-11-15 DIAGNOSIS — M791 Myalgia: Secondary | ICD-10-CM | POA: Diagnosis not present

## 2016-11-15 MED FILL — BENLYSTA/200MG/ML/SOAJ: BENLYSTA/200MG/ML/SOAJ | 30 days supply | Qty: 4 | Fill #4

## 2016-12-07 NOTE — Unmapped (Signed)
[name] is a 51 y.o. female contacted today regarding refill of her specialty medication(s): BENLYSTA     Reviewed and verified with patient:      Specialty medication(s) and dose(s) confirmed: yes  Changes to medications: no  Changes to insurance: no     Medication Adherence    Patient Reported X Missed Doses in the Last Month:  0  Specialty Medication:  BENLYSTA  Informant:  patient  Medication Assistance Program  Refill Coordination  Has the Patients' Contact Information Changed:  No    Is the Shipping Address Different:  No    Shipping Information  Delivery Scheduled:  Yes  Delivery Date:  12/14/16  Medications to be Shipped:  BENLYSTA DELIVER 12/13/16 INJECT 12/16/16       Next dose of BENLYSTA from this shipment due on 12/16/16.       All questions were answered and contact information provided for any future questions/concerns.      Roselyn Meier

## 2016-12-07 NOTE — Unmapped (Signed)
[facility] Specialty Pharmacy Refill Coordination Note  Medication: BENLYSTA    Unable to reach patient to schedule shipment for medication being filled at [facility]. Left voicemail on phone.  As this is the 3rd unsuccessful attempt to reach the patient, no additional phone call attempts will be made at this time.      Phone numbers attempted: [phone]  Last scheduled delivery: 11/15/16    Please call the [facility] at [phone] (option 4) should you have any further questions.      Thanks,  [facility] Specialty Team

## 2016-12-12 MED FILL — BENLYSTA/200MG/ML/SOAJ: BENLYSTA/200MG/ML/SOAJ | 30 days supply | Qty: 4 | Fill #5

## 2016-12-26 ENCOUNTER — Other Ambulatory Visit: Payer: Self-pay | Admitting: Endocrinology

## 2016-12-26 DIAGNOSIS — E049 Nontoxic goiter, unspecified: Secondary | ICD-10-CM

## 2016-12-27 DIAGNOSIS — G43719 Chronic migraine without aura, intractable, without status migrainosus: Secondary | ICD-10-CM | POA: Diagnosis not present

## 2016-12-27 DIAGNOSIS — M791 Myalgia, unspecified site: Secondary | ICD-10-CM | POA: Diagnosis not present

## 2016-12-27 DIAGNOSIS — G518 Other disorders of facial nerve: Secondary | ICD-10-CM | POA: Diagnosis not present

## 2016-12-27 DIAGNOSIS — M542 Cervicalgia: Secondary | ICD-10-CM | POA: Diagnosis not present

## 2017-01-04 NOTE — Unmapped (Signed)
Pt has been approved for the [facility] Co-pay Program through 12-31-2017. For any questions call [phone],Monday through Friday,8am to 8pmET.

## 2017-01-05 NOTE — Unmapped (Signed)
[facility] Specialty Pharmacy Refill Coordination Note  Specialty Medication(s): Benlysta  Additional Medications shipped: none    Consetta Cosner, DOB: 05-14-1965  Phone: [phone] (home) , Alternate phone contact: N/A  Phone or address changes today?: No  All above HIPAA information was verified with patient.  Shipping Address: [address]  [place] [address]   Insurance changes? No    Completed refill call assessment today to schedule patient's medication shipment from the [facility] [phone]).      Confirmed the medication and dosage are correct and have not changed: Yes, regimen is correct and unchanged.    Confirmed patient started or stopped the following medications in the past month:  No, there are no changes reported at this time.    Are you tolerating your medication?:  Corayma reports tolerating the medication.    ADHERENCE        Did you miss any doses in the past 4 weeks? No missed doses reported.    FINANCIAL/SHIPPING    Delivery Scheduled: Yes, Expected medication delivery date: 01/10/17     Caelan did not have any additional questions at this time.    Delivery address validated in [facility] scheduling system: Yes, address listed in [facility] is correct.    We will follow up with patient monthly for standard refill processing and delivery.      Thank you,  Rollen Sox   [facility] Specialty Pharmacist

## 2017-01-09 MED FILL — BENLYSTA/200MG/ML/SOAJ: BENLYSTA/200MG/ML/SOAJ | 30 days supply | Qty: 4 | Fill #6

## 2017-01-10 ENCOUNTER — Other Ambulatory Visit: Payer: Self-pay | Admitting: Urology

## 2017-01-10 DIAGNOSIS — D1771 Benign lipomatous neoplasm of kidney: Secondary | ICD-10-CM

## 2017-01-10 DIAGNOSIS — D3002 Benign neoplasm of left kidney: Secondary | ICD-10-CM | POA: Diagnosis not present

## 2017-01-10 DIAGNOSIS — D3001 Benign neoplasm of right kidney: Secondary | ICD-10-CM | POA: Diagnosis not present

## 2017-01-12 ENCOUNTER — Other Ambulatory Visit: Payer: Self-pay | Admitting: Urology

## 2017-01-12 ENCOUNTER — Other Ambulatory Visit (HOSPITAL_COMMUNITY): Payer: Self-pay | Admitting: Interventional Radiology

## 2017-01-12 ENCOUNTER — Ambulatory Visit
Admission: RE | Admit: 2017-01-12 | Discharge: 2017-01-12 | Disposition: A | Discharge status: Home / Self Care | Payer: BLUE CROSS/BLUE SHIELD | Source: Ambulatory Visit | Attending: Endocrinology | Admitting: Endocrinology

## 2017-01-12 ENCOUNTER — Other Ambulatory Visit: Payer: Self-pay | Admitting: *Deleted

## 2017-01-12 ENCOUNTER — Ambulatory Visit
Admission: RE | Admit: 2017-01-12 | Discharge: 2017-01-12 | Disposition: A | Discharge status: Home / Self Care | Payer: BLUE CROSS/BLUE SHIELD | Source: Ambulatory Visit | Attending: Urology | Admitting: Urology

## 2017-01-12 DIAGNOSIS — D1771 Benign lipomatous neoplasm of kidney: Secondary | ICD-10-CM

## 2017-01-12 DIAGNOSIS — E041 Nontoxic single thyroid nodule: Secondary | ICD-10-CM | POA: Diagnosis not present

## 2017-01-12 DIAGNOSIS — E049 Nontoxic goiter, unspecified: Secondary | ICD-10-CM

## 2017-01-12 DIAGNOSIS — I701 Atherosclerosis of renal artery: Secondary | ICD-10-CM

## 2017-01-12 HISTORY — PX: IR RADIOLOGIST EVAL & MGMT: IMG5224

## 2017-01-12 NOTE — Consult Note (Signed)
Chief Complaint: Left renal angiomyolipoma   Referring Physician(s): McKenzie,Patrick L  History of Present Illness: Dawn Marsh is a 51 y.o. 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 She underwent a remote left renal ablation for a benign lesion in 2013. She recently had an outpatient ultrasound which demonstrated a 2 cm left lower pole slightly exophytic mass with increased echogenicity suspicious for interval enlargement of the angiomyolipoma. Previous measurements were 8 mm. She does report mild intermittent left flank pain but has not had any dysuria or hematuria.  Past Medical History:  Diagnosis Date  . Anemia of other chronic disease 12/27/2012  . Anxiety   . Chest pain    constant-ongoing for years--sometimes eases up--but always comes back  . GERD (gastroesophageal reflux disease)    burning in stomach and gas--no med  . Goiter    sometimes trouble swallowing  . Heart murmur   . 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 Hypertension   . Left renal mass   . Leukopenia 12/27/2012  . Lupus   . Migraine   . Nontoxic simple goitre 12/27/2012  . Rhinitis, allergic    seasonal  . Seizures (HCC)    migraines  . SLE (systemic lupus erythematosus) ([place]) 12/27/2012  . Vertigo     Past Surgical History:  Procedure Laterality Date  . ABDOMINAL HYSTERECTOMY    . ABDOMINAL HYSTERECTOMY  2011  . KIDNEY SURGERY    . MYOMECTOMY ABDOMINAL APPROACH    . myomectomy x 2 (prior to the hysterectomy)  2002 and 2006  . TUMOR REMOVAL      Allergies: Amoxicillin; Amoxil [amoxicillin trihydrate]; Penicillins; Penicillins; and Latex  Medications: Prior to Admission medications   Medication Sig Start Date End Date Taking? Authorizing Provider  acetaminophen (TYLENOL) 500 MG tablet Take 500 mg by mouth every 6 (six) hours as needed.   Yes [provider]  Ascorbic Acid (VITAMIN C PO) Take 1 capsule by mouth daily.   Yes [provider]  belimumab (BENLYSTA) 120 MG SOLR injection Inject 1 mL into the vein once a week.   Yes [provider]  dexamethasone (DECADRON) 10 MG/ML injection Inject 1 mL into the vein every 6 (six) weeks.   Yes [provider]  DULoxetine (CYMBALTA) 20 MG capsule Take 20 mg by mouth daily. 05/05/16  Yes [provider]  Vitamin D, Ergocalciferol, (DRISDOL) 50000 UNITS CAPS capsule Take 50,000 Units by mouth every 7 (seven) days.   Yes [provider]  diazepam (VALIUM) 2 MG tablet Take 2 mg by mouth every 8 (eight) hours as needed. 1/2 TAB AS NEEDED. 08/22/14   [provider]     No family history on file.  Social History   Social History  . Marital status: Divorced    Spouse name: N/A  . Number of children: N/A  . Years of education: N/A   Social History Main Topics  . Smoking status: Never Smoker  . Smokeless tobacco: Never Used  . Alcohol use Yes     Comment: occas-maybe once a month  . Drug use: No  . Sexual activity: Not on file   Other Topics Concern  . Not on file   Social History Narrative   ** Merged History Encounter **          Review of Systems: A 12 point ROS discussed and pertinent positives are indicated in the HPI above.  All other systems are negative.  Review of Systems  Vital Signs: BP (!) 148/90   Pulse 83   Temp 98.1 F (36.7 C) (Oral)   Resp 14   Ht 5\' 5"  (1.651 m)   Wt 146 lb (66.2 kg)   SpO2 100%   BMI 24.30 kg/m   Physical Exam  Constitutional: She is oriented to person, place, and time. She appears well-developed and well-nourished. No distress.  Eyes: Conjunctivae are normal. No scleral icterus.  Cardiovascular: Normal rate and regular rhythm.   No murmur heard. Pulmonary/Chest: Effort normal and breath sounds normal. No respiratory distress.  Abdominal: Soft. Bowel sounds are normal. She exhibits no distension and no mass. There is no tenderness.  Musculoskeletal: Normal range of  motion. She exhibits no edema.  Neurological: She is alert and oriented to person, place, and time.  Skin: Skin is warm and dry. She is not diaphoretic.  Psychiatric: She has a normal mood and affect.    Mallampati Score:   2  Imaging: No results found.  Labs:  CBC:  Recent Labs  09/26/16 0805  WBC 2.9*  HGB 12.6  HCT 37.9  PLT 235    COAGS: No results for input(s): INR, APTT in the last 8760 hours.  BMP: No results for input(s): NA, K, CL, CO2, GLUCOSE, BUN, CALCIUM, CREATININE, GFRNONAA, GFRAA in the last 8760 hours.  Invalid input(s): CMP  LIVER FUNCTION TESTS: No results for input(s): BILITOT, AST, ALT, ALKPHOS, PROT, ALBUMIN in the last 8760 hours.    Assessment and Plan:  Ultrasound findings suggesting interval enlargement of a known left lower pole angiomyolipoma now measuring 2 cm. Previous imaging was by CT. Treatment options including embolization versus surveillance were reviewed with the patient. All questions were addressed. She has a full understanding of the available options. After our discussion, I would recommend a repeat abdomen CTA with delayed imaging to accurately assess the vascularity and also the size of the lesion.  Plan: Schedule for abdomen CTA with delayed imaging. Outpatient follow-up to review the scan.  Thank you for this interesting consult.  I greatly enjoyed meeting Trishelle Devora and look forward to participating in their care.  A copy of this report was sent to the requesting provider on this date.  Electronically Signed: Greggory Keen 01/12/2017, 2:32 PM   I spent a total of  40 Minutes   in face to face in clinical consultation, greater than 50% of which was counseling/coordinating care for this patient with a left angiomyolipoma.

## 2017-01-19 ENCOUNTER — Ambulatory Visit
Admission: RE | Admit: 2017-01-19 | Discharge: 2017-01-19 | Disposition: A | Discharge status: Home / Self Care | Payer: BC Managed Care – PPO | Admitting: Rheumatology

## 2017-01-19 DIAGNOSIS — R42 Dizziness and giddiness: Secondary | ICD-10-CM | POA: Diagnosis not present

## 2017-01-19 DIAGNOSIS — Z6824 Body mass index (BMI) 24.0-24.9, adult: Secondary | ICD-10-CM | POA: Diagnosis not present

## 2017-01-19 DIAGNOSIS — M329 Systemic lupus erythematosus, unspecified: Secondary | ICD-10-CM | POA: Diagnosis not present

## 2017-01-19 DIAGNOSIS — I059 Rheumatic mitral valve disease, unspecified: Secondary | ICD-10-CM | POA: Diagnosis not present

## 2017-01-19 DIAGNOSIS — R079 Chest pain, unspecified: Secondary | ICD-10-CM | POA: Diagnosis not present

## 2017-01-19 DIAGNOSIS — Z79899 Other long term (current) drug therapy: Secondary | ICD-10-CM | POA: Diagnosis not present

## 2017-01-19 DIAGNOSIS — M797 Fibromyalgia: Secondary | ICD-10-CM | POA: Diagnosis not present

## 2017-01-19 DIAGNOSIS — F419 Anxiety disorder, unspecified: Secondary | ICD-10-CM | POA: Diagnosis not present

## 2017-01-19 DIAGNOSIS — G4700 Insomnia, unspecified: Secondary | ICD-10-CM | POA: Diagnosis not present

## 2017-01-19 DIAGNOSIS — F329 Major depressive disorder, single episode, unspecified: Secondary | ICD-10-CM | POA: Diagnosis not present

## 2017-01-19 LAB — BUN / CREAT RATIO: Urea nitrogen/Creatinine:MRto:Pt:Ser/Plas:Qn:: 20

## 2017-01-19 LAB — COMPREHENSIVE METABOLIC PANEL
ALBUMIN: 4.5 g/dL (ref 3.5–5.0)
ALKALINE PHOSPHATASE: 198 U/L — ABNORMAL HIGH (ref 38–126)
ALT (SGPT): 26 U/L (ref 15–48)
ANION GAP: 8 mmol/L — ABNORMAL LOW (ref 9–15)
AST (SGOT): 29 U/L (ref 14–38)
BILIRUBIN TOTAL: 0.4 mg/dL (ref 0.0–1.2)
BLOOD UREA NITROGEN: 17 mg/dL (ref 7–21)
BUN / CREAT RATIO: 20
CALCIUM: 9.5 mg/dL (ref 8.5–10.2)
CHLORIDE: 105 mmol/L (ref 98–107)
CO2: 28 mmol/L (ref 22.0–30.0)
CREATININE: 0.86 mg/dL (ref 0.60–1.00)
EGFR MDRD NON AF AMER: 70 mL/min/{1.73_m2} (ref >=60)
GLUCOSE RANDOM: 91 mg/dL (ref 65–99)
POTASSIUM: 4.6 mmol/L (ref 3.5–5.0)
PROTEIN TOTAL: 8.3 g/dL (ref 6.5–8.3)
SODIUM: 141 mmol/L (ref 135–145)

## 2017-01-19 LAB — RED CELL DISTRIBUTION WIDTH: Lab: 12.4

## 2017-01-19 LAB — URINALYSIS
BACTERIA: NONE SEEN /HPF
BILIRUBIN UA: NEGATIVE
BLOOD UA: NEGATIVE
GLUCOSE UA: NEGATIVE
KETONES UA: NEGATIVE
NITRITE UA: NEGATIVE
PH UA: 6.5 (ref 5.0–9.0)
PROTEIN UA: NEGATIVE
SPECIFIC GRAVITY UA: 1.014 (ref 1.003–1.030)
SQUAMOUS EPITHELIAL: <1 /HPF (ref 0–5)
UROBILINOGEN UA: 0.2
WBC UA: 3 /HPF (ref 0–5)

## 2017-01-19 LAB — CBC W/ AUTO DIFF
BASOPHILS ABSOLUTE COUNT: 0 10*9/L (ref 0.0–0.1)
EOSINOPHILS ABSOLUTE COUNT: 0 10*9/L (ref 0.0–0.4)
HEMATOCRIT: 38.1 % (ref 36.0–46.0)
LARGE UNSTAINED CELLS: 5 % — ABNORMAL HIGH (ref 0–4)
LYMPHOCYTES ABSOLUTE COUNT: 0.8 10*9/L — ABNORMAL LOW (ref 1.5–5.0)
MEAN CORPUSCULAR HEMOGLOBIN CONC: 31.9 g/dL (ref 31.0–37.0)
MEAN CORPUSCULAR HEMOGLOBIN: 30.8 pg (ref 26.0–34.0)
MEAN CORPUSCULAR VOLUME: 96.8 fL (ref 80.0–100.0)
MONOCYTES ABSOLUTE COUNT: 0.3 10*9/L (ref 0.2–0.8)
NEUTROPHILS ABSOLUTE COUNT: 1.3 10*9/L — ABNORMAL LOW (ref 2.0–7.5)
PLATELET COUNT: 293 10*9/L (ref 150–440)
RED BLOOD CELL COUNT: 3.93 10*12/L — ABNORMAL LOW (ref 4.00–5.20)
RED CELL DISTRIBUTION WIDTH: 12.4 % (ref 12.0–15.0)
WBC ADJUSTED: 2.5 10*9/L — ABNORMAL LOW (ref 4.5–11.0)

## 2017-01-19 LAB — BILIRUBIN UA: Lab: NEGATIVE

## 2017-01-19 LAB — C3 COMPLEMENT: Complement C3:MCnc:Pt:Ser/Plas:Qn:: 91

## 2017-01-19 LAB — PROTEIN / CREATININE RATIO, URINE: PROTEIN URINE: <4 mg/dL

## 2017-01-19 LAB — PROTEIN URINE: Protein:MCnc:Pt:Urine:Qn:: <4

## 2017-01-19 LAB — C4 COMPLEMENT: Complement C4:MCnc:Pt:Ser/Plas:Qn:: 24.5

## 2017-01-19 MED ORDER — BELIMUMAB 200 MG/ML SUBCUTANEOUS AUTO-INJECTOR
INJECTION | SUBCUTANEOUS | 12 refills | 0.00000 days | Status: CP
Start: 2017-01-19 — End: 2017-03-03

## 2017-01-19 NOTE — Unmapped (Signed)
Last Clinic Visit: May 2018 with Dr. Broadus John.      RHEUMATOLOGY FOLLOW UP NOTE    Reason for visit: f/u SLE    Assessment and Plan:   The patient is a 51 y.o. AAF with SLE (dx 2014) characerized by fatigue, leukopenia, arthralgia, pleurisy, reactive LAD, brain fog, +ANA, +dsDNA, +Ro, +RNP, low titer aCL IgM currently on belimumab SQ weekly who presents for follow up.    SLE: currently on belimumab SQ with overall stability in her symptoms including improvement in pleurisy, arthralgia, brain fog. No evidence of inflammatory arthritis, rash today.   - will obtain labs to monitor for SLE activity as well as therapy toxicity of this high risk medication including CBC with diff, Cr, urinalysis, urine protein:creatinine ratio, C3/C4 and dsDNA  --Continue benlysta SQ weekly injection  --not on plaquenil due to G6PD deficiency  --do agree with PCP that there is a component of fibromyalgia. On cymbalta managed by PCP.     Dizziness and HA/brain fog/anxiety: MRI brain and MRA head and neck normal Aug 2017. Neurocognitive testing in 07/2015 with mild impairment but likely multifactorial including SLE, fatigue, anxiety/depression. May be exacerbated by poor sleep.   --Referral to sleep medicine (neuro) given sleep difficulties.  --we discussed at length the importance of stress management and sleep hygiene.     Fibromalgia:  The nature of Fibromyalgia was discussed in detail with as well as the three pillars of treatment consisting of regular exercise, good restorative sleep, and addressing mental health concerns given high prevalence of anxiety, depression, and PTSD symptoms in patients with fibromyalgia.   - Encouraged pt to resume regular exercise as she was feeling better when exercising regularly.   - Encouraged focusing on mental health. Pt acknowledges she has anxiety and not working is hard. However no insurance coverage for therapist.  - Encouraged good restorative sleep, reviewed sleep hygiene. Referral to sleep specialist.      Intermittent pleurisy/PFTs with isolated low DLCO: Pleurisy likely due to SLE now resolved, TTE in 2015 with diastolic dysfunction, mitral valve disease, mild dilated LA and thoracic aorta. Seen by cardiology in 02/2015 who recommended f/u TTE every 2-3 years. PFTs normal spirometry but low DLCO. TTE 07/2015 normal  --TTE every 2-3 years  ??  Other:  --vitamin D deficiency: cont supplementation.   --confirm prevnar and pneumovax with PCP prior to giving these.      -------------------------------------------------------------------------------------------------------------------------    HPI:   51 y.o. AAF with SLE (dx 2014) characerized by fatigue, leukopenia, arthralgias, pleurisy, reactive LAD, fatigue, brain fog, +ANA, +dsDNA, +Ro, +RNP, low titer aCL IgM who presents for follow-up visit of SLE.     In brief, upon diagnosis in 2014 the patient was started on MTX [place] with significant improvement in arthralgia, brain fog, fatigue but  DC'd in 04/2014 2/2 mild elevation in LFTs and leukopenia (ANC <1),  leflunomide tried without improvement in joint pain. G6PD markedly low, thus plaquenil not started due to concern for hemolytic anemia. Has not been able to tolerate steroids (even low dose) 2/2 anxiety. Trial of azathioprine started in 06/2014 with close monitoring of counts 2/2 low normal TPMT but DC'd 2/2 worsening leukopenia (WBC 2.4, ANC 1) in beginning of 11/2014. In 12/2014, the patient was started on belimumab. Of note, the patient has had persistently enlarged LAD - work up including PET CT with hypermetabolic LAD in neck, chest, pelvis. SLE vs. Lymphoma. Had bx 10/2014 by ENT of L neck node - reactive hyperplasia, no evidence  of malignancy.    At last visit, the patient was complaining of worsening headaches and dizziness with a sensation of a tingling/electric charges inside her head. Intermittent dizziness as well. No blurred vision, no slurred speech, no LOC, no seizures, no cognitive changes although feels that she has been having difficulty focusing on tasks. Similar to symptoms in 2010. Previous told that the symptoms were due to migraines. SLE labs within stable range - dsDNA 1:80, leukopenia persistent, normal C3 and C4    In 07/2015, the patient was seen by Dr. Janee Morn for neurocognitive testing which revealed mild impairment consistent with a patient with SLE and was felt that fatigue was playing a role in her reduced cognitive efficiency. Recommended going to counseling as well due to adjustment to disability and depression/anxiety. TTE 07/2015 also normal. Sent for MRI brain/MRA head and neck which were normal 10/2015.     **I carefully reviewed the documentation including recent office visits, labs and tests that were performed in the EMR since the last visit.     Interim History:  Overall doing pretty well with normal pain that she continuously has which she attributes to fibromyalgia. Continues to have difficulty with brain part, where she has to take it easy, if she thinks too much, her brain is overworked. But better - can now drive. Thinks benlysta helps with this, SQ better than infusion, but tries to avoid stress. Dizziness and vertigo better (gets decadron shots every 6 weeks from neuro). Still has some chest pain for years. Now on cymbalta for fibromyalgia from her PCP which initially helped for the first month, but pain crept back, pain intermittent lasts 2-3 weeks then resolves whereas previously would have for months. Continues to have difficulty with sleep - slee[s 3-5 hours, then wakes up intermittently, no pain. She thinks with cymbalta she is having more dreams and remembers when she wakes up. Hard to turn off her brain. Has cut out caffeine. Never seen sleep specialist. Started exercising, but not past month due to pain in her low back which she attributes to a tumor in her kidney bothering her recently. Tired/sluggish last two weeks which she attributes to not exercising. Had nose bleed 1 month ago, has one sore in R nares. Also gets lumps under her arms and in her groin in the past year, look like a pimple, open up and drain, last a few weeks ago.    Currently out of work, trying to get disability. Worried if she goes back to work, would stress put her back in a bad position. Pt not working since Aug 2016 due to health reasons, requesting letter for tax purposes as used money from retirement account.     Review of Systems: Patient denies fevers, chills, drenching night sweats, significant weight loss, oral/genital ulceration,  chest pain, pleurisy, cough, abdominal pain, nausea/vomiting, diarrhea, melena, dysuria, foamy urine, new skin rash, muscle weakness or Raynaud's symptoms. All other systems reviewed are negative    Record review:  I have reviewed the patient's allergies, medications, pertinent past medical, surgical, social and family history and have updated in Epic where appropriate.     Medications:  Current Outpatient Prescriptions   Medication Sig Dispense Refill   ??? baclofen (LIORESAL) 10 MG tablet TAKE 1/2 TO 1 TABLET TWICE DAILY AS NEEDED; LIMIT TO 2 HEADACHE DAYS PER WEEK; DO NOT USE DAILY  0   ??? belimumab 200 mg/mL AtIn Inject 200 mg under the skin every seven (7) days. 4 Syringe 12   ???  BENLYSTA 200 mg/mL AtIn INJECT 200 MG UNDER THE SKIN EVERY 7 DAYS 4 mL 6   ??? cholecalciferol, vitamin D3, 2,000 unit Tab Take 2,000 Units by mouth daily.      ??? clotrimazole (LOTRIMIN) 1 % cream Apply topically Two (2) times a day.     ??? dexamethasone (DECADRON) 10 mg/mL injection Infuse into a venous catheter. Dose unknown, every 2 weeks. Injections. Given with lidocaine and marcaine.     ??? diazePAM (VALIUM) 2 MG tablet Take 2 mg by mouth once as needed for anxiety.     ??? DULoxetine (CYMBALTA) 20 MG capsule Take 20 mg by mouth daily.  0   ??? ibuprofen (ADVIL,MOTRIN) 200 MG tablet Take 200 mg by mouth continuous as needed for pain.     ??? meclizine (ANTIVERT) 25 mg tablet 1 PO at onset of vertigo, may repeat Q 4-6 hours 30 tablet 1   ??? naproxen sodium (ALEVE) 220 MG tablet Take 220 mg by mouth continuous as needed for pain.     ??? OMEGA3/DHA/EPA/FISH OIL/VIT D3 (FISH OIL-VIT D3 ORAL) Take 500 mg by mouth.     ??? omeprazole (PRILOSEC) 20 MG capsule Take 1 capsule (20 mg total) by mouth daily. 90 capsule 3   ??? oxymetazoline (NASAL RELIEF) 0.05 % nasal spray 2 sprays continuous as needed for congestion.     ??? propylene glycol (SYSTANE BALANCE) 0.6 % Drop Apply to eye continuous as needed.     ??? zonisamide (ZONEGRAN) 25 MG capsule take 4 capsules by mouth at bedtime (TITRATE AS DIRECTED BY PRESCRIBER)  0     No current facility-administered medications for this visit.      Physical Exam:  Vitals:    01/19/17 1012   BP: 143/88   Pulse: 78   Resp: 16   Weight: 67 kg (147 lb 12.8 oz)   Height: 165.1 cm (5' 5)      GENERAL: Well developed female in no acute distress  HEENT: PERRL without injection, EOMI, sclera anicteric, OP clear without ulceration or thrush, parotids not enlarged  LYMPH: no palpable lymphadenopathy, has palpable goiter without focal nodules, no bruits  CARDIAC: HRR, no murmurs  RESPIRATORY: CTA-B.  Breathing non-labored.  GI: soft, non-tender, non-distended, no HSM.   EXT: no lower extremity edema  SKIN: no rash, no raynaud's.   MSK: full range of motion of shoulder, elbow, wrist, hips, knees and ankles. No synovitis of the small joints of the hands or feet. No joint deformities, no effusions, no warmth.   NEURO: alert and oriented, normal gait, no focal deficits, CN2-12 grossly intact. Gait normal.     Labs:  Reviewed labs from May 2018.    10/2012:   G6PD: 1.7 (low. nml range 7-20); IgG 2380, IgA 478, IgM 205; C3 96, C4 14  RF 20+; ANA + 1;1280 speckled, CCP: negative,  dsDNA +201,  RO + 127, RNP +99, SSB, Sm, SCl-70: negative  B2Gp1: negative  ACA IgM +12, IgG, IGA: negative  QTF gold: negative, Vitamin D: 30,  Spep: negative, Hep B and C neg    Imaging/Pathology:  Lymph node bx L neck (10/27/14) - reactive lymphoid hyperplasia, no monoclonal subpopulation.    CT Chest/abdomen/pelvis 09/18/14:  Bilateral axillary and subpectoral adenopathy. Is of indeterminate acuity, as the chest has not been imaged previously. Although these could be reactive in this patient with a history of lupus, lymphoproliferative process such as lymphoma is a concern.  2. Further retraction of left-sided renal ablation site,  without evidence of locally recurrent or metastatic disease. 3. New or enlarged 8 mm left renal angiomyolipoma.    PET 10/03/14: 1. Small but hypermetabolic nodes within the neck, chest, and pelvis as detailed above. These remain suspicious for lymphoproliferative process such as lymphoma. inflammatory etiologies felt less likely. 2. Slightly degraded evaluation for cervical and upper thoracic hypermetabolic nodes, secondary to hypermetabolic Brown fat. 3. No findings to suggest recurrent or metastatic renal cell carcinoma.    MRI Brain 10/03/14:  1. No intracranial metastatic disease identified. 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  2. No acute intracranial abnormality. Mild for age nonspecific cerebral white matter signal changes have mildly progressed since 2010. These could be the sequelae of SLE. 3. Upper cervical lymph nodes bilaterally appear regressed since 2010.    12/2013: CXR - Normal lung volumes. No pleural effusions or edema. No evidence of pericardial effusion. Heart size within normal limits     04/2013: TTE: Normal left ventricular ejection fraction (60-65%); Diastolic left ventricular dysfunction; Degenerative mitral valve disease; Mitral valve prolapse; Mitral regurgitation (trivial); Dilated left atrium; Dilated thoracic aorta (see detail below); Normal right ventricular contractile performance; Tricuspid regurgitation (trivial); Elevated central venous and right atrial pressures    02/2014: Spirometry - FVC 2.81, 93%; FEV1 2.2, 90%; FEV1/FVC 95%; DLCO 45%    07/2015 - normal TTE    MRI brain/MRA head and neck 10/2015: normal    Total duration of the visit was 40 minutes, and > than 50 % of the visit was spent in face-to-face counseling focusing on diagnosis and therapeutic options.    Return in about 6 months (around 07/19/2017).

## 2017-01-19 NOTE — Unmapped (Signed)
Your physician today was Dr. Danella Maiers    Thank you for letting us be involved with your care!    1. Labs today.  It can take 10-14 days for all of the test results to come back. I will send you a MyChart message when they are complete.   2. Flu vaccine: Already completed  3. I recommend walking or other exercise regularly. Start with 10 minutes a day 5 days/week. Gradually increase by 2-3 minutes per session every few weeks.   4. Please activate your MyChart account if you have not already done so. You can send [place] non-urgent messages and we can send you your test results online.   5. If you have non-urgent questions, the best way to contact us is to send a MyChart message by visiting [email or web address].  You can also use MyChart to request refills and access test results.  I will do my best to respond within 2 business days, however occasionally it may take longer. If you have immediate concerns, please contact our clinic by phone [phone].    6. Next appointment: 6 months       Today we discussed the following:    I think your pain is most likely due to fibromyalgia which is a condition where you have amplified pain.  I am providing you with information from the [employer] of Rheumatology as well as my own recommendations on treatment which focuses on three pillars of treatment including physical activity, good restorative health, and mental health.      The three pillars of treating fibromyalgia are the following:    1. Regular physical activity where you get your heart beating a little faster and you breath a little harder than normal.   ?? I recommend you start an aerobic program such as walking. Start with 10 minutes 5 days/week.    ?? After 2 weeks, gradually add 3-5 minutes per session every 1-2 weeks.  ?? Keep a diary of your progress.  ?? Reward yourself for success!   ?? Walk With Ease is an excellent walking program.   ?? Resources:   ?? Exerciseismedicine.org  ?? Everybodywalk.org 2. Good restorative sleep.   ?? Practice good sleep practices including going to bed at the same time every night, only using your bed for sleep, limit caffeine 4-6 hours before bedtime, limit TV and computer use for 1-2 hours before bed, etc.  ?? Some people benefit from a sleep study to look for sleep apnea. You can discuss this more with your primary care physician.  ?? Resource: http://blog.BeepThis.co.uk    3. Individuals with fibromyalgia very often have symptoms of depression, anxiety, and symptoms of post-traumatic stress from prior traumatic event or abuse. It is important to address these symptoms to help improve your pain. You can talk more to your primary care physician or to a therapist.

## 2017-01-20 NOTE — Unmapped (Signed)
Not all your labs are back, but so far the results show that your white blood cells (the infection fighting cells) are slightly low which they have been in the past as well.  I recommend we continue to keep an eye on them.    Other labs were normal or with minor abnormalities without clinical significance.     I will continue to update you as results become available.     Dr. Sullivan Lone   01/20/2017

## 2017-01-24 LAB — DSDNA ANTIBODY: Lab: NEGATIVE

## 2017-01-25 ENCOUNTER — Ambulatory Visit (HOSPITAL_COMMUNITY)
Admission: RE | Admit: 2017-01-25 | Discharge: 2017-01-25 | Disposition: A | Discharge status: Home / Self Care | Payer: BLUE CROSS/BLUE SHIELD | Source: Ambulatory Visit | Attending: Interventional Radiology | Admitting: Interventional Radiology

## 2017-01-25 ENCOUNTER — Encounter (HOSPITAL_COMMUNITY): Payer: Self-pay

## 2017-01-25 ENCOUNTER — Ambulatory Visit (HOSPITAL_COMMUNITY): Payer: BLUE CROSS/BLUE SHIELD

## 2017-01-31 DIAGNOSIS — E663 Overweight: Secondary | ICD-10-CM | POA: Diagnosis not present

## 2017-01-31 DIAGNOSIS — I10 Essential (primary) hypertension: Secondary | ICD-10-CM | POA: Diagnosis not present

## 2017-01-31 DIAGNOSIS — Z1389 Encounter for screening for other disorder: Secondary | ICD-10-CM | POA: Diagnosis not present

## 2017-01-31 DIAGNOSIS — L723 Sebaceous cyst: Secondary | ICD-10-CM | POA: Diagnosis not present

## 2017-01-31 DIAGNOSIS — M329 Systemic lupus erythematosus, unspecified: Secondary | ICD-10-CM | POA: Diagnosis not present

## 2017-01-31 DIAGNOSIS — Z6825 Body mass index (BMI) 25.0-25.9, adult: Secondary | ICD-10-CM | POA: Diagnosis not present

## 2017-01-31 DIAGNOSIS — L0889 Other specified local infections of the skin and subcutaneous tissue: Secondary | ICD-10-CM | POA: Diagnosis not present

## 2017-02-01 ENCOUNTER — Encounter: Payer: Self-pay | Admitting: Interventional Radiology

## 2017-02-01 NOTE — Unmapped (Signed)
[facility] Refill Coordination Note  Specialty Medication(s): Benlysta  Additional Medications shipped: none    Kalijah Westfall, DOB: Mar 06, 1966  Phone: [phone] (home) , Alternate phone contact: N/A  Phone or address changes today?: No  All above HIPAA information was verified with patient.  Shipping Address: [address]  [place] [address]   Insurance changes? No    Completed refill call assessment today to schedule patient's medication shipment from the [facility] [phone]).      Confirmed the medication and dosage are correct and have not changed: Yes, regimen is correct and unchanged.    Confirmed patient started or stopped the following medications in the past month:  No, there are no changes reported at this time.    Are you tolerating your medication?:  Nancylee reports tolerating the medication.    ADHERENCE        Did you miss any doses in the past 4 weeks? No missed doses reported.    FINANCIAL/SHIPPING    Delivery Scheduled: Yes, Expected medication delivery date: 02/08/17     Aneshia did not have any additional questions at this time.    Delivery address validated in [facility] scheduling system: Yes, address listed in [facility] is correct.    We will follow up with patient monthly for standard refill processing and delivery.      Thank you,  Rollen Sox   [facility] Specialty Pharmacist

## 2017-02-06 ENCOUNTER — Ambulatory Visit (HOSPITAL_COMMUNITY)
Admission: RE | Admit: 2017-02-06 | Discharge: 2017-02-06 | Disposition: A | Discharge status: Home / Self Care | Payer: BLUE CROSS/BLUE SHIELD | Source: Ambulatory Visit | Attending: Interventional Radiology | Admitting: Interventional Radiology

## 2017-02-06 ENCOUNTER — Encounter (HOSPITAL_COMMUNITY): Payer: Self-pay

## 2017-02-06 ENCOUNTER — Other Ambulatory Visit (HOSPITAL_COMMUNITY): Payer: Self-pay | Admitting: Interventional Radiology

## 2017-02-06 DIAGNOSIS — L0889 Other specified local infections of the skin and subcutaneous tissue: Secondary | ICD-10-CM | POA: Diagnosis not present

## 2017-02-06 DIAGNOSIS — D1771 Benign lipomatous neoplasm of kidney: Secondary | ICD-10-CM | POA: Diagnosis not present

## 2017-02-06 DIAGNOSIS — Z6825 Body mass index (BMI) 25.0-25.9, adult: Secondary | ICD-10-CM | POA: Diagnosis not present

## 2017-02-06 DIAGNOSIS — D1809 Hemangioma of other sites: Secondary | ICD-10-CM | POA: Insufficient documentation

## 2017-02-06 DIAGNOSIS — Z1389 Encounter for screening for other disorder: Secondary | ICD-10-CM | POA: Diagnosis not present

## 2017-02-06 DIAGNOSIS — E663 Overweight: Secondary | ICD-10-CM | POA: Diagnosis not present

## 2017-02-06 LAB — POCT I-STAT CREATININE: CREATININE: 0.9 mg/dL (ref 0.44–1.00)

## 2017-02-06 MED ORDER — IOPAMIDOL (ISOVUE-300) INJECTION 61%
100.0000 mL | Freq: Once | INTRAVENOUS | Status: AC | PRN
  Administered 2017-02-06: 100 mL via INTRAVENOUS

## 2017-02-06 MED ORDER — IOPAMIDOL (ISOVUE-300) INJECTION 61%
INTRAVENOUS | Status: AC
  Filled 2017-02-06: qty 100

## 2017-02-07 ENCOUNTER — Ambulatory Visit
Admission: RE | Admit: 2017-02-07 | Discharge: 2017-02-07 | Disposition: A | Discharge status: Home / Self Care | Payer: BLUE CROSS/BLUE SHIELD | Source: Ambulatory Visit | Attending: Urology | Admitting: Urology

## 2017-02-07 ENCOUNTER — Encounter: Payer: Self-pay | Admitting: Radiology

## 2017-02-07 DIAGNOSIS — D1771 Benign lipomatous neoplasm of kidney: Secondary | ICD-10-CM | POA: Diagnosis not present

## 2017-02-07 HISTORY — PX: IR RADIOLOGIST EVAL & MGMT: IMG5224

## 2017-02-07 MED FILL — BENLYSTA/200MG/ML/SOAJ: BENLYSTA/200MG/ML/SOAJ | 28 days supply | Qty: 4 | Fill #0

## 2017-02-07 NOTE — Progress Notes (Signed)
Patient ID: Dawn Marsh, female   DOB: Feb 29, 1966, 51 y.o.   MRN: [id number]       Chief Complaint:  Left renal angiomyolipoma.  Referring Physician(s): McKenzie,Patrick L  History of Present Illness: Dawn Marsh is a 51 y.o. 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 Previously, she underwent left renal ablation for a benign lesion in 2013. Left renal ablation site has been stable.  Recent outpatient imaging demonstrated a 2 cm left renal lower pole echogenic exophytic mass by ultrasound concerning for enlargement of the angiomyolipoma. Previously, the angiomyolipoma measured 8 mm. She remains asymptomatic. No significant abdominal or flank pain. No dysuria or hematuria. She returns after outpatient CT imaging to reevaluate the lesion.   CT imaging with contrast yesterday confirms a 2 cm left renal lower pole exophytic angiomyolipoma. This has enlarged compared to a 2016 exam when the lesion measured 8 mm. No signs of interval hemorrhage or any other retroperitoneal abnormality.   Past Medical History:  Diagnosis Date  . Anemia of other chronic disease 12/27/2012  . Anxiety   . Chest pain    constant-ongoing for years--sometimes eases up--but always comes back  . GERD (gastroesophageal reflux disease)    burning in stomach and gas--no med  . Goiter    sometimes trouble swallowing  . Heart murmur   . 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 Hypertension   . Left renal mass   . Leukopenia 12/27/2012  . Lupus   . Migraine   . Nontoxic simple goitre 12/27/2012  . Rhinitis, allergic    seasonal  . Seizures (HCC)    migraines  . SLE (systemic lupus erythematosus) ([place]) 12/27/2012  . Vertigo     Past Surgical History:  Procedure Laterality Date  . ABDOMINAL HYSTERECTOMY    . ABDOMINAL HYSTERECTOMY  2011  . IR RADIOLOGIST EVAL & MGMT  01/12/2017  . KIDNEY SURGERY    . MYOMECTOMY ABDOMINAL APPROACH    . myomectomy x 2 (prior to the hysterectomy)   2002 and 2006  . TUMOR REMOVAL      Allergies: Amoxicillin; Amoxil [amoxicillin trihydrate]; Penicillins; Penicillins; and Latex  Medications: Prior to Admission medications   Medication Sig Start Date End Date Taking? Authorizing Provider  acetaminophen (TYLENOL) 500 MG tablet Take 500 mg by mouth every 6 (six) hours as needed.   Yes [provider]  Ascorbic Acid (VITAMIN C PO) Take 1 capsule by mouth daily.   Yes [provider]  belimumab (BENLYSTA) 120 MG SOLR injection Inject 1 mL into the vein once a week.   Yes [provider]  dexamethasone (DECADRON) 10 MG/ML injection Inject 1 mL into the vein every 6 (six) weeks.   Yes [provider]  doxycycline (VIBRAMYCIN) 100 MG capsule Take 100 mg by mouth 2 (two) times daily.   Yes [provider]  DULoxetine (CYMBALTA) 20 MG capsule Take 20 mg by mouth daily. 05/05/16  Yes [provider]  Vitamin D, Ergocalciferol, (DRISDOL) 50000 UNITS CAPS capsule Take 50,000 Units by mouth every 7 (seven) days.   Yes [provider]  diazepam (VALIUM) 2 MG tablet Take 2 mg by mouth every 8 (eight) hours as needed. 1/2 TAB AS NEEDED. 08/22/14   [provider]     No family history on file.  Social History   Socioeconomic History  . Marital status: Divorced    Spouse name: Not on file  . Number of children: Not on file  . Years of  education: Not on file  . Highest education level: Not on file  Social Needs  . Financial resource strain: Not on file  . Food insecurity - worry: Not on file  . Food insecurity - inability: Not on file  . Transportation needs - medical: Not on file  . Transportation needs - non-medical: Not on file  Occupational History  . Not on file  Tobacco Use  . Smoking status: Never Smoker  . Smokeless tobacco: Never Used  Substance and Sexual Activity  . Alcohol use: Yes    Comment: occas-maybe once a month  . Drug use: No  . Sexual activity: Not  on file  Other Topics Concern  . Not on file  Social History Narrative   ** Merged History Encounter **          Review of Systems: A 12 point ROS discussed and pertinent positives are indicated in the HPI above.  All other systems are negative.  Review of Systems  Vital Signs: BP 117/83   Pulse 80   Temp 98.4 F (36.9 C) (Oral)   Resp 14   Ht 5' 4.5" (1.638 m)   Wt 146 lb (66.2 kg)   SpO2 99%   BMI 24.67 kg/m   Physical Exam  Constitutional: She is oriented to person, place, and time. She appears well-developed and well-nourished. No distress.  Eyes: Conjunctivae are normal. No scleral icterus.  Cardiovascular: Normal rate and regular rhythm.  Pulmonary/Chest: Effort normal and breath sounds normal.  Abdominal: Soft. Bowel sounds are normal. She exhibits no distension. There is no tenderness.  Musculoskeletal: Normal range of motion. She exhibits no edema, tenderness or deformity.  Neurological: She is alert and oriented to person, place, and time.  Skin: Skin is dry. She is not diaphoretic.  Psychiatric: She has a normal mood and affect. Her behavior is normal.      Imaging: Ct Abdomen W Wo Contrast  Result Date: 02/06/2017 CLINICAL DATA:  LEFT renal mass cryoablation 2013. Remote LEFT flank pain for 2 months EXAM: CT ABDOMEN WITHOUT AND WITH CONTRAST TECHNIQUE: Multidetector CT imaging of the abdomen was performed following the standard protocol before and following the bolus administration of intravenous contrast. CONTRAST:  129mL ISOVUE-300 IOPAMIDOL (ISOVUE-300) INJECTION 61% COMPARISON:  CT 09/18/2014, PET-CT 726 FINDINGS: Lower chest:  Lung bases are clear. Hepatobiliary: Peripherally enhancing lesion in the subcapsular LEFT hepatic lobe measuring 9 mm (image 19, series 7) has imaging characteristics consistent benign hemangioma. Lesion not changed in size from comparison exam. Normal gallbladder Pancreas: Normal pancreatic parenchymal intensity. No ductal dilatation  or inflammation. Spleen: Normal spleen. Adrenals/urinary tract: Adrenal glands are normal. Cryoablation defect in the mid LEFT kidney with no evidence of nodularity or abnormal enhancement at the ablation site. Fat containing lesion in the lateral aspect of the LEFT lower pole measures 18 mm by 12 mm (image 54, series 6) increased from 9 mm by 10 mm on CT 09/18/2014. Lesion has peripheral enhancing septations. The splenule medial to the inferior aspect spleen measuring 12 mm (image 40, series 6 No enhancing RIGHT renal lesion. Stomach/Bowel: Stomach and limited of the small bowel is unremarkable Vascular/Lymphatic: Abdominal aortic normal caliber. No retroperitoneal adenopathy. Musculoskeletal: No aggressive osseous lesion IMPRESSION: 1. Interval enlargement of angiomyolipoma partially exophytic from the lower pole of the RIGHT kidney. 2. No evidence of local renal cell carcinoma recurrence at the cryoablation site in the mid LEFT kidney. Normal RIGHT kidney. 3. Benign hepatic hemangioma. Electronically Signed   By: Nicole Kindred  Leonia Reeves M.D.   On: 02/06/2017 08:52   US Thyroid  Result Date: 01/12/2017 CLINICAL DATA:  Goiter. EXAM: THYROID ULTRASOUND TECHNIQUE: Ultrasound examination of the thyroid gland and adjacent soft tissues was performed. COMPARISON:  06/27/2016 FINDINGS: Parenchymal Echotexture: Mildly heterogenous Isthmus: 4 mm Right lobe: 5.7 x 1.6 x 2.2 cm, previously 6.8 x 1.5 x 2.2 cm Left lobe: 5.4 by I 0.7 x 2.1 cm, previously 6.1 x 1.8 x 2.1 cm _________________________________________________________ Estimated total number of nodules >/= 1 cm: 2 Number of spongiform nodules >/=  2 cm not described below (TR1): 0 Number of mixed cystic and solid nodules >/= 1.5 cm not described below ([place]): 0 _________________________________________________________ Nodule # 2: Location: Left; Inferior Maximum size: 1.3, previously 1.6 cm; Other 2 dimensions: 0.9 x 0.6, previously 1.0 x 0.7 cm Composition: mixed cystic  and solid (1) Echogenicity: isoechoic (1) Shape: not taller-than-wide (0) Margins: ill-defined (0) Echogenic foci: large comet-tail artifacts (0) ACR TI-RADS total points: 2. ACR TI-RADS risk category: TR2 (2 points). ACR TI-RADS recommendations: This nodule does NOT meet TI-RADS criteria for biopsy or dedicated follow-up. _________________________________________________________ Previously described left mid thyroid 1.1 cm cystic TR 1 nodule is also stable and does not meet criteria for biopsy or follow-up. No new thyroid abnormality. IMPRESSION: Stable to slightly smaller left inferior 1.3 cm TR 2 nodule, which previously was rated TR 3. This nodule is now more cystic. Nodule does not meet criteria for biopsy or any follow-up. No new thyroid abnormality. The above is in keeping with the ACR TI-RADS recommendations - J Am Coll Radiol 2017;14:587-595. Electronically Signed   By: Jerilynn Mages.  Mylz Yuan M.D.   On: 01/12/2017 16:35   Ir Radiologist Eval & Mgmt  Result Date: 02/01/2017 Please refer to notes tab for details about interventional procedure. (Op Note)   Labs:  CBC: Recent Labs    09/26/16 0805  WBC 2.9*  HGB 12.6  HCT 37.9  PLT 235    COAGS: No results for input(s): INR, APTT in the last 8760 hours.  BMP: Recent Labs    02/06/17 0744  CREATININE 0.90    LIVER FUNCTION TESTS: No results for input(s): BILITOT, AST, ALT, ALKPHOS, PROT, ALBUMIN in the last 8760 hours.    Assessment and Plan:  2 cm left renal lower pole angiomyolipoma demonstrating interval enlargement previously measuring 8 mm in 2016. No signs of interval complication or hemorrhage. Imaging reviewed with the patient on the monitor. All questions addressed.  Plan: Even though the lesion has demonstrated some growth over 2 years, at 2 cm the recommendation would remain to continue surveillance unless there is evidence of spontaneous hemorrhage or the lesion reaches 4 cm. Then, the lesion would warrant  angioembolization.  Continue annual surveillance with a repeat CT with contrast in November 2019.    Electronically Signed: Greggory Keen 02/07/2017, 2:20 PM   I spent a total of    25 Minutes in face to face in clinical consultation, greater than 50% of which was counseling/coordinating care for this patient with a left renal agent myolipoma*

## 2017-02-16 NOTE — Unmapped (Signed)
-----   Message from Hillard Danker sent at 02/16/2017 11:59 AM EST -----  Regarding: Update on patient   Patient has some things that she needed to have updated. She has received the signed form she needed.  This is what she emailed me.       Tonya Boyer:   At the time I saw the doctor I wasn't having the constant all day as it is now.    1. Not seeing therapist due to not having insurance. I have insurance, I had mentioned I could not afford it due to the cost.    2. The chest pain I have felt better for a couple of months when on the cymbalta for fibromyalgia. The pain is back now and has not been resolved. It can come and go and be worst or better. Lately it has been worst. I was told by several physicans that it was possibly pleurisy or inflammation of the heart lining,since the heart looked ok. I have been having constant pains that move to different areas throughout the entire chest area now and. I'm hoping it will go away in a few weeks.     Also the pimples, were more of lumps or boils to me. I had a large boil come up on my neck a few days after I visited Dr. Sullivan Lone. I had it lanced and treated for a staphat my primary physician office.

## 2017-02-16 NOTE — Unmapped (Signed)
Received the following update on the pt.  Will ask to email back and let her know we will include her update in her record.     Danella Maiers, MD, MSCI  February 16, 2017   1:22 PM

## 2017-02-22 NOTE — Unmapped (Signed)
Returned call to pt.  She received call from insurance telling her form was not completed sufficienctly to make decision on disability.  We had not discussed disability at her appt.  She feels she is unable to work currently.  Still having trouble with sleep, days when severe pain, fatigue, cognitive dysfunction, etc.      Then called and spoke to [facility] mutual [employer].  He is trying to understand if pt is capable of working in any capacity.  I told him I though she was unable to work at this time including part time or with restrictions.  Treatment she will be receiving until next appt in May: benlysta and referral to sleep specialist.  Will reevaluate at next appt in May.    Danella Maiers, MD, MSCI  February 22, 2017   3:53 PM

## 2017-02-22 NOTE — Unmapped (Signed)
-----   Message from Laural Roes, RN sent at 02/22/2017 12:46 PM EST -----  Regarding: Disablity paper  Contact: [phone]  Dorinda Hill from [employer]  Called and asked  If she is able to work in any capacity or limited.  Would like a call back at [phone].  Ref# [id number]

## 2017-02-22 NOTE — Unmapped (Signed)
-----   Message from Hillard Danker sent at 02/22/2017 12:42 PM EST -----  Regarding: clarification on insurance form  Contact: [phone]  Patient received call from insurance company regarding form and information faxed.  She would like speak with about the dates and what it needs to include as they are determining whether the payment plan can continue.

## 2017-03-02 NOTE — Unmapped (Signed)
[facility] Refill Coordination Note  Specialty Medication(s): Neysha Criado, DOB: 1966-02-21  Phone: [phone] (home) , Alternate phone contact: N/A  Phone or address changes today?: No  All above HIPAA information was verified with patient.  Shipping Address: [address]  [place] [address]   Insurance changes? No    Completed refill call assessment today to schedule patient's medication shipment from the [facility] [phone]).      Confirmed the medication and dosage are correct and have not changed: Yes, regimen is correct and unchanged.    Confirmed patient started or stopped the following medications in the past month:  No, there are no changes reported at this time.    Are you tolerating your medication?:  Brittani reports tolerating the medication.    ADHERENCE    (Below is required for Medicare Part B or Transplant patients only - per drug):   How many tablets were dispensed last month: 4  Patient currently has 1 TO BE USED 12/21 remaining.    Did you miss any doses in the past 4 weeks? No missed doses reported.    FINANCIAL/SHIPPING    Delivery Scheduled: Yes, Expected medication delivery date: 12/27, TO BE USED 12/28   WE ARE SHIPPING LAST MINUTE AS UPS IS BACKED UP RIGHT NOW, AND CONCERNED IT WILL NOT ARRIVE NEXT DAY AT THIS TIME.    Raniya had the following additional questions or need for a follow-up phone call from the Clinical Pharmacy Team: Patient had multiple questions/states     1- Patient reports a doctor was treating the patient for a boil on her neck, that they thought have been MRSA or Staph. Patient was treated Acutely with Doxycycline Hyclate 100mg  BID x 10d. She also was given Mupirocin cream %2 to put in her 'nose every day for 7 days'.     2- Patient reports for the last 30 to 45 days she has been waking up in the middle of the night feeling like she is being strangled. I asked patient to elaborate, and she reports she wakes up coughing and her eyes are running with tears. She feels like she is also coming down with a cold and has been taking Coricidin HBP and Sugar Free Honey Lemon Menthol cough drops. If she is still feeling sick after the 1st of the year (2019), she will follow up with her PCP.    3- Patient reports she has expressed concern with injection sites. She reports that she is unable to inject into her stomach, as she has 'a pouch' and maybe that is why the medication just comes right back out of her stomach. She reports she has been pretty much been injecting into her thighs. She has tried the hip and the knees, but ends up closer to the thigh, which are extremely sore. Patient would like counseling of other sites she may use, or tips for the injection to work in her stomach where medicine does not leak back out, etc.     Delivery address validated in [facility]SI scheduling system: Yes, address listed in FSI is correct.    We will follow up with patient monthly for standard refill processing and delivery.      Thank you,  [name]   [facility] Specialty Technician

## 2017-03-02 NOTE — Unmapped (Signed)
Received message from [facility] that patient is experiencing injection site issues with Benlysta Pen.  She is having more pain/soreness with injection in the thigh.  When switched to the abdomen, she is not able to inject without medication leaking out of her skin.  Injection techniques reviewed but think it's best to switch to subq Benlysta syringe instead of pen.  She was on subq methotrexate before and familiar with syringe use.      New script for [facility] syringe sent to [facility] and will go out with next shipment.     Chelsea Aus

## 2017-03-03 MED ORDER — BELIMUMAB 200 MG/ML SUBCUTANEOUS SYRINGE
SUBCUTANEOUS | 3 refills | 0.00000 days | Status: CP
Start: 2017-03-03 — End: 2017-03-03

## 2017-03-03 MED ORDER — BELIMUMAB 200 MG/ML SUBCUTANEOUS SYRINGE: mL | 3 refills | 0 days

## 2017-03-03 MED ORDER — BELIMUMAB 200 MG/ML SUBCUTANEOUS SYRINGE: mL | 4 refills | 0 days

## 2017-03-03 MED ORDER — EMPTY CONTAINER
2 refills | 0 days
Start: 2017-03-03 — End: 2018-03-03

## 2017-03-03 NOTE — Unmapped (Signed)
Addended by: Geoffry Paradise T on: 03/03/2017 02:46 PM     Modules accepted: Orders

## 2017-03-05 MED FILL — SHARPS KIT/NA/MISC: SHARPS KIT/NA/MISC | 120 days supply | Qty: 1 | Fill #0

## 2017-03-05 MED FILL — BENLYSTA/200MG/ML/SOSY: BENLYSTA/200MG/ML/SOSY | 84 days supply | Qty: 12 | Fill #0

## 2017-03-13 DIAGNOSIS — M791 Myalgia, unspecified site: Secondary | ICD-10-CM | POA: Diagnosis not present

## 2017-03-13 DIAGNOSIS — G43719 Chronic migraine without aura, intractable, without status migrainosus: Secondary | ICD-10-CM | POA: Diagnosis not present

## 2017-03-13 DIAGNOSIS — G518 Other disorders of facial nerve: Secondary | ICD-10-CM | POA: Diagnosis not present

## 2017-03-13 DIAGNOSIS — M542 Cervicalgia: Secondary | ICD-10-CM | POA: Diagnosis not present

## 2017-05-03 NOTE — Unmapped (Signed)
[facility]: Rheumatology Clinic Assessment Call    Specialty Medication(s): Benlysta  Indication(s): SLE      Tonya Boyer, DOB: 1965/05/28  Above HIPAA information was verified with patient.      Medications reviewed & verified: Allergies - Medications -      Specialty medication(s) & dose(s) confirmed: yes  Changes to medications: no  Changes to insurance: no  Tolerating medications:   Adverse Effects    *All other systems reviewed and are negative          ADHERENCE     Medication Adherence    Patient Reported X Missed Doses in the Last Month:  0  Specialty Medication:  Benlysta   Patient is on additional specialty medications:  No  Informant:  patient  Provider-Estimated Medication Adherence Level:  good  Medication Assistance Program  Refill Coordination  Shipping Information         CLINICAL ASSESSMENT     Tonya Boyer reports tolerating Benlysta well without adverse effects.  Adherence to therapy confirmed with patient and refill record @ [facility].  Patient reports the switching from [facility] auto-injector to the syringe did help with injection site pain.  She is experiencing intermittent pleuritic pain, more frequently the past 2 months.  This was noted in previous progress notes.  Will inform Dr. Sullivan Lone.  Appropriate to continue Benlysta at this time.      Does Tonya Boyer have follow up appointment scheduled with clinic? Yes, appointment is scheduled and patient is aware    All questions were answered and contact information provided for any future questions/concerns.      Jeneen Montgomery

## 2017-05-04 NOTE — Unmapped (Signed)
Per Dr. Elisabeth Cara 2/20 msg offer pt 2/21 appt @ 915 am, i have it on hold.AE

## 2017-05-25 NOTE — Unmapped (Signed)
[facility] Refill Coordination Note  Specialty Medication(s): benlysta  Additional Medications shipped: N/A    Tonya Boyer, DOB: [birth year]  Phone: [phone] (home) , Alternate phone contact: N/A  Phone or address changes today?: No  All above HIPAA information was verified with patient.  Shipping Address: [address]  [place] [address]   Insurance changes? No    Completed refill call assessment today to schedule patient's medication shipment from the [facility] [phone]).      Confirmed the medication and dosage are correct and have not changed: Yes, regimen is correct and unchanged.    Confirmed patient started or stopped the following medications in the past month:  No, there are no changes reported at this time.    Are you tolerating your medication?:  Maelin reports tolerating the medication.    ADHERENCE    Patient has one pen left for this Friday 3/15. Sending out new box to be used next Friday.     Did you miss any doses in the past 4 weeks? No missed doses reported.    FINANCIAL/SHIPPING    Delivery Scheduled: Yes, Expected medication delivery date: 05/30/17     The patient will receive an FSI print out for each medication shipped and additional FDA Medication Guides as required.  Patient education from [place] or Robet Leu may also be included in the shipment    Damia did not have any additional questions at this time.    Delivery address validated in FSI scheduling system: Yes, address listed in FSI is correct.    We will follow up with patient monthly for standard refill processing and delivery.      Thank you,  [name]   [facility] Specialty Technician

## 2017-05-29 MED FILL — BENLYSTA/200MG/ML/SOSY: BENLYSTA/200MG/ML/SOSY | 84 days supply | Qty: 12 | Fill #1

## 2017-06-20 DIAGNOSIS — Z1231 Encounter for screening mammogram for malignant neoplasm of breast: Secondary | ICD-10-CM | POA: Diagnosis not present

## 2017-06-20 DIAGNOSIS — Z6826 Body mass index (BMI) 26.0-26.9, adult: Secondary | ICD-10-CM | POA: Diagnosis not present

## 2017-06-20 DIAGNOSIS — Z01419 Encounter for gynecological examination (general) (routine) without abnormal findings: Secondary | ICD-10-CM | POA: Diagnosis not present

## 2017-06-26 IMAGING — PT NM PET TUM IMG INITIAL (PI) SKULL BASE T - THIGH
1 of 8 series · 1 of 25 positions shown · non-contrast
Comparison: Chest abdomen and pelvis CTs of 09/18/2014. Neck CT of
09/18/2014. No prior PET.

CLINICAL DATA: Initial treatment strategy for evaluate
lymphadenopathy. Renal mass, status post left renal mass
cryoablation. History of renal cell carcinoma. Lupus. Night sweats.
Lymphadenopathy in the head neck..

EXAM:
NUCLEAR MEDICINE PET SKULL BASE TO THIGH
TECHNIQUE: 6.9 mCi F-18 FDG was injected intravenously. Full-ring PET imaging
was performed from the skull base to thigh after the radiotracer. CT
data was obtained and used for attenuation correction and anatomic
localization.
FASTING BLOOD GLUCOSE:  Value: 90 mg/dl

[Series 6: pet hn_sk_thigh ac · axial · 5.0mm · 4.07mm/px · 1 of 219 slices shown]
[im 110/219]
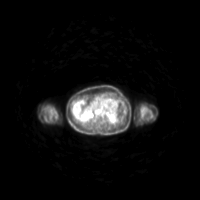

[1 of 25 positions shown; findings below may reference images not displayed]

FINDINGS: NECK

Hypermetabolic "Brown" fat is identified within the neck and upper
chest. This degrades evaluation for hypermetabolic nodes in these
areas. Regardless, there are small hypermetabolic nodes identified
within the neck bilaterally. Example posterior
triangle/supraclavicular left-sided node which measures 6 mm and a
S.U.V. max of 3.5 on image 37 of series 4.

A left level 2 jugular node measures 6 mm and a S.U.V. max of 3.6 on
image 22.

CHEST

Bilateral hypermetabolic subpectoral and less so axillary nodes. In
index deep left axillary node maintains its fatty hilum, measures 8
mm and a S.U.V. max of 2.5 on image 61.

Right subpectoral node measures 9 mm and a S.U.V. max of 4.0 on
image 52.

Hypermetabolism corresponding to residual thymic tissue in the
anterior mediastinum. This measures a S.U.V. max of 3.3 on image 65.

ABDOMEN/PELVIS

No abnormal abdominal nodal activity. Bilateral hypermetabolic
pelvic sidewall nodes. A left external iliac node measures 6 mm and
a S.U.V. max of 3.0 on image 164.

Right external iliac node measures 8 mm and a S.U.V. max of 2.7 on
image 166.

SKELETON

No abnormal marrow activity.

CT IMAGES PERFORMED FOR ATTENUATION CORRECTION

Left maxillary sinus mucosal thickening. Chest, abdomen, and pelvic
findings deferred to recent diagnostic CTs. No acute superimposed
process. Trace cul-de-sac fluid may be physiologic. Hysterectomy.
Left iliac sclerosis which is nonspecific.
IMPRESSION: 1. Small but hypermetabolic nodes within the neck, chest, and pelvis
as detailed above. These remain suspicious for lymphoproliferative
process such as lymphoma. inflammatory etiologies felt less likely.
2. Slightly degraded evaluation for cervical and upper thoracic
hypermetabolic nodes, secondary to hypermetabolic "Brown" fat.
3. No findings to suggest recurrent or metastatic renal cell
carcinoma.

## 2017-07-20 ENCOUNTER — Ambulatory Visit: Admit: 2017-07-20 | Discharge: 2017-07-21 | Payer: MEDICARE

## 2017-07-20 DIAGNOSIS — E559 Vitamin D deficiency, unspecified: Secondary | ICD-10-CM | POA: Diagnosis not present

## 2017-07-20 DIAGNOSIS — F419 Anxiety disorder, unspecified: Secondary | ICD-10-CM | POA: Diagnosis not present

## 2017-07-20 DIAGNOSIS — M329 Systemic lupus erythematosus, unspecified: Secondary | ICD-10-CM | POA: Diagnosis not present

## 2017-07-20 DIAGNOSIS — Z6825 Body mass index (BMI) 25.0-25.9, adult: Secondary | ICD-10-CM | POA: Diagnosis not present

## 2017-07-20 DIAGNOSIS — M797 Fibromyalgia: Secondary | ICD-10-CM | POA: Diagnosis not present

## 2017-07-20 DIAGNOSIS — I059 Rheumatic mitral valve disease, unspecified: Secondary | ICD-10-CM | POA: Diagnosis not present

## 2017-07-20 DIAGNOSIS — R51 Headache: Secondary | ICD-10-CM | POA: Diagnosis not present

## 2017-07-20 DIAGNOSIS — R42 Dizziness and giddiness: Secondary | ICD-10-CM | POA: Diagnosis not present

## 2017-07-20 DIAGNOSIS — F329 Major depressive disorder, single episode, unspecified: Secondary | ICD-10-CM | POA: Diagnosis not present

## 2017-07-20 LAB — COMPREHENSIVE METABOLIC PANEL
ALBUMIN: 4.5 g/dL (ref 3.5–5.0)
ALKALINE PHOSPHATASE: 180 U/L — ABNORMAL HIGH (ref 38–126)
ALT (SGPT): 10 U/L — ABNORMAL LOW (ref 15–48)
ANION GAP: 12 mmol/L (ref 9–15)
AST (SGOT): 24 U/L (ref 14–38)
BILIRUBIN TOTAL: 0.6 mg/dL (ref 0.0–1.2)
BLOOD UREA NITROGEN: 18 mg/dL (ref 7–21)
BUN / CREAT RATIO: 20
CALCIUM: 9.9 mg/dL (ref 8.5–10.2)
CO2: 27 mmol/L (ref 22.0–30.0)
CREATININE: 0.89 mg/dL (ref 0.60–1.00)
EGFR MDRD AF AMER: 81 mL/min/{1.73_m2} (ref >=60)
EGFR MDRD NON AF AMER: 67 mL/min/{1.73_m2} (ref >=60)
GLUCOSE RANDOM: 87 mg/dL (ref 65–179)
POTASSIUM: 4.1 mmol/L (ref 3.5–5.0)
PROTEIN TOTAL: 8.4 g/dL — ABNORMAL HIGH (ref 6.5–8.3)
SODIUM: 144 mmol/L (ref 135–145)

## 2017-07-20 LAB — CBC W/ AUTO DIFF
BASOPHILS ABSOLUTE COUNT: 0 10*9/L (ref 0.0–0.1)
EOSINOPHILS ABSOLUTE COUNT: 0 10*9/L (ref 0.0–0.4)
EOSINOPHILS RELATIVE PERCENT: 1.4 %
HEMATOCRIT: 38.2 % (ref 36.0–46.0)
HEMOGLOBIN: 12 g/dL (ref 12.0–16.0)
LARGE UNSTAINED CELLS: 2 % (ref 0–4)
LYMPHOCYTES ABSOLUTE COUNT: 0.7 10*9/L — ABNORMAL LOW (ref 1.5–5.0)
LYMPHOCYTES RELATIVE PERCENT: 24 %
MEAN CORPUSCULAR HEMOGLOBIN CONC: 31.5 g/dL (ref 31.0–37.0)
MEAN CORPUSCULAR HEMOGLOBIN: 30.2 pg (ref 26.0–34.0)
MEAN CORPUSCULAR VOLUME: 96 fL (ref 80.0–100.0)
MONOCYTES ABSOLUTE COUNT: 0.4 10*9/L (ref 0.2–0.8)
MONOCYTES RELATIVE PERCENT: 11.9 %
NEUTROPHILS ABSOLUTE COUNT: 1.7 10*9/L — ABNORMAL LOW (ref 2.0–7.5)
NEUTROPHILS RELATIVE PERCENT: 59.9 %
RED BLOOD CELL COUNT: 3.97 10*12/L — ABNORMAL LOW (ref 4.00–5.20)
RED CELL DISTRIBUTION WIDTH: 12.9 % (ref 12.0–15.0)
WBC ADJUSTED: 2.9 10*9/L — ABNORMAL LOW (ref 4.5–11.0)

## 2017-07-20 LAB — C3 COMPLEMENT: Complement C3:MCnc:Pt:Ser/Plas:Qn:: 106

## 2017-07-20 LAB — HYPOCHROMIA

## 2017-07-20 LAB — EGFR MDRD AF AMER: Glomerular filtration rate/1.73 sq M.predicted.black:ArVRat:Pt:Ser/Plas/Bld:Qn:Creatinine-based formula (MDRD): 81

## 2017-07-20 MED ORDER — HYDROXYCHLOROQUINE 200 MG TABLET: 200 mg | tablet | 3 refills | 0 days

## 2017-07-20 MED ORDER — HYDROXYCHLOROQUINE 200 MG TABLET
ORAL_TABLET | Freq: Every day | ORAL | 3 refills | 0.00000 days | Status: CP
Start: 2017-07-20 — End: 2017-12-14

## 2017-07-20 NOTE — Unmapped (Signed)
Last Clinic Visit: Nov 2018     RHEUMATOLOGY FOLLOW UP NOTE    Reason for visit: f/u SLE    Assessment and Plan:   The patient is a 52 y.o. AAF with SLE (dx 2014) characerized by fatigue, leukopenia, arthralgia, pleurisy, reactive LAD, brain fog, +ANA, +dsDNA, +Ro, +RNP, low titer aCL IgM currently on belimumab SQ weekly who presents for follow up.    SLE: currently on belimumab SQ with overall stability in her symptoms including improvement in pleurisy, arthralgia, brain fog. No evidence of inflammatory arthritis, rash today though has had some episodes of chest pain that may have been consistent with pleuritic chest pain.   - Labs today to monitor for SLE activity.   - Continue benlysta SQ weekly injection  - Pt previously not on plaquenil due to G6PD deficiency. Discussed with pt that new research shows G6PD deficiency does not preclude treatment with plaquenil, discussed option of trial of plaquenil 200 mg daily (limiting dose) with repeat CBC in 1 month. Reviewed that there is some risk for hemolytic anemia, but think it would reasonable to try given she may have benefit from plaquenil in terms of treating her other lupus symptoms.  (Arthritis Care Res. 2018 Mar;70(3):481-485. doi: 10.1002/acr.23296. Epub 2018 Feb 9. Examination of Hydroxychloroquine Use and Hemolytic Anemia in G6PDH-Deficient Patients. Sharlene Dory, Clowse MEB, Eudy AM, Criscione-Schreiber LG.)  - I agree with PCP that there is a component of fibromyalgia. On cymbalta managed by PCP.     High risk medication: Starting plaquenil.  - CBC in 1 month given G6PD deficiency.  - Pt prefers to see her own eye doctor for baseline plaquenil eye exam. Asked to have copy of note faxed to our clinic.     Dizziness and HA/brain fog/anxiety: MRI brain and MRA head and neck normal Aug 2017. Neurocognitive testing in 07/2015 with mild impairment but likely multifactorial including SLE, fatigue, anxiety/depression. May be exacerbated by poor sleep.   - Pt declines referral to sleep medicine (neuro) today as she sees too many doctors.     Fibromalgia: I think some of her symptoms may be due to fibromyalgia.   - Encouraged good restorative sleep, reviewed sleep hygiene. Pt declined referral to sleep specialist today.    - Encouraged her to continue to walk regularly.     Intermittent pleurisy/PFTs with isolated low DLCO: Pleurisy likely due to SLE now resolved, TTE in 2015 with diastolic dysfunction, mitral valve disease, mild dilated LA and thoracic aorta. Seen by cardiology in 02/2015 who recommended f/u TTE every 2-3 years. PFTs normal spirometry but low DLCO. TTE 07/2015 normal  --TTE every 2-3 years  ??  Vitamin D deficiency: cont supplementation.     Immunization Counseling:   - Asked pt to request pneumococcal vaccine records from PCP so we can clarify if completed previously.     Disability: Pt continues to feel she is unable to work, concerned that her disease would flare if working regularly and would be too exhausted, unable to work regularly with frequent flares. Too much fatigue if does more than minimal activities for a few days. Continues to have difficulty sleeping.   - Encouraged pt to begin to volunteer a few hours a week.     -------------------------------------------------------------------------------------------------------------------------    HPI:   52 y.o. AAF with SLE (dx 2014) characerized by fatigue, leukopenia, arthralgias, pleurisy, reactive LAD, fatigue, brain fog, +ANA, +dsDNA, +Ro, +RNP, low titer aCL IgM who presents for follow-up visit of SLE.  In brief, upon diagnosis in 2014 the patient was started on MTX [place] with significant improvement in arthralgia, brain fog, fatigue but  DC'd in 04/2014 2/2 mild elevation in LFTs and leukopenia (ANC <1),  leflunomide tried without improvement in joint pain. G6PD markedly low, thus plaquenil not started due to concern for hemolytic anemia. Has not been able to tolerate steroids (even low dose) 2/2 anxiety. Trial of azathioprine started in 06/2014 with close monitoring of counts 2/2 low normal TPMT but DC'd 2/2 worsening leukopenia (WBC 2.4, ANC 1) in beginning of 11/2014. In 12/2014, the patient was started on belimumab. Of note, the patient has had persistently enlarged LAD - work up including PET CT with hypermetabolic LAD in neck, chest, pelvis. SLE vs. Lymphoma. Had bx 10/2014 by ENT of L neck node - reactive hyperplasia, no evidence of malignancy.    At last visit, the patient was complaining of worsening headaches and dizziness with a sensation of a tingling/electric charges inside her head. Intermittent dizziness as well. No blurred vision, no slurred speech, no LOC, no seizures, no cognitive changes although feels that she has been having difficulty focusing on tasks. Similar to symptoms in 2010. Previous told that the symptoms were due to migraines. SLE labs within stable range - dsDNA 1:80, leukopenia persistent, normal C3 and C4    In 07/2015, the patient was seen by Dr. Janee Morn for neurocognitive testing which revealed mild impairment consistent with a patient with SLE and was felt that fatigue was playing a role in her reduced cognitive efficiency. Recommended going to counseling as well due to adjustment to disability and depression/anxiety. TTE 07/2015 also normal. Sent for MRI brain/MRA head and neck which were normal 10/2015.     Interval History:  - Remains on benlysta SQ every 7 days.   - Currently having pain in her side (L abdomen and lumbar paraspinal region).  - Had constant sharp chest pain in Feb for about 3 weeks, had for 2-3 more weeks, then resolved a few weeks ago. Still has some intermittent pain in her chest and upper back, feels sore and heavy. Worse with deep breath. Not positional.   - Has been having more stiffness, can be stiff for 2-3 days all days, then feels better. Has this in her back and hip girdle.   - Also has pain in her legs and some numbness in her feet.   - Continues to have issues with her brain which she feels is better on belysta, but still has it. When she thinks a lot or concentrating or has more stress finds it hard to function. Sees neuro outside of [place] (Dr. Zachery Conch) for vertigo, dizziness, weird feeling in her brain.   - Continues to have fatigue, especially if she does a lot for several days.   - She thinks the benlysta is helping especially with the brain part which would be 1000 times worse without the medicine.   - Her sleep is better. Did not see sleep medicine. Good and bad nights.   - Has had some joint pain in her wrist and knees intermittently.   - Continues to have knots in her armpits and groin.  - No alopecia, rash, photosensitivity, dry eyes or mouth, oral or nasal ulcers, or Raynaud's phenomenon.     Record review:  I have reviewed the patient's allergies, medications, pertinent past medical, surgical, social and family history and have updated in Epic where appropriate.     Review of Systems: All other systems reviewed  were negative except as noted above.       Medications:  Current Outpatient Medications   Medication Sig Dispense Refill   ??? belimumab (BENLYSTA) 200 mg/mL Syrg Inject 200 mg under the skin every seven (7) days. 12 mL 3   ??? clotrimazole (LOTRIMIN) 1 % cream Apply topically Two (2) times a day.     ??? dexamethasone (DECADRON) 10 mg/mL injection Infuse into a venous catheter. Dose unknown, every 2 weeks. Injections. Given with lidocaine and marcaine.     ??? DULoxetine (CYMBALTA) 20 MG capsule Take 20 mg by mouth daily.  0   ??? naproxen sodium (ALEVE) 220 MG tablet Take 220 mg by mouth continuous as needed for pain.     ??? hydroxychloroquine (PLAQUENIL) 200 mg tablet Take 1 tablet (200 mg total) by mouth daily. 30 tablet 3     No current facility-administered medications for this visit.      Physical Exam:  Vitals:    07/20/17 1133   Temp: 36.2 ??C (97.1 ??F)   TempSrc: Oral   Weight: 69.4 kg (153 lb)   Body mass index is 25.46 kg/m??.      GENERAL: Well developed female in no acute distress  HEENT: PERRL without injection, EOMI, sclera anicteric, OP clear without ulceration or thrush, parotids not enlarged  LYMPH: no cervical lymphadenopathy  CARDIAC: HRR, no murmurs.    Chest: CTA-B.  Breathing non-labored. Anterior chest diffusely TTP.   GI: soft, non-tender, non-distended, no HSM.   EXT: no lower extremity edema, warm well perfused.   SKIN: no rash, no raynaud's.   MSK: full range of motion of shoulder, elbow, wrist, hips, knees and ankles. No synovitis of the small joints of the hands or feet. No joint deformities, no effusions, no warmth.  Except: BUE diffusely TTP. L ankle mild TTP. Pain in L groin with hip IR. Mild TTP and tight trapezius muscles.  NEURO: alert and oriented, normal gait, no focal deficits, CN2-12 grossly intact. Gait normal.     Labs:  Reviewed labs from May 2018.    10/2012:   G6PD: 1.7 (low. nml range 7-20); IgG 2380, IgA 478, IgM 205; C3 96, C4 14  RF 20+; ANA + 1;1280 speckled, CCP: negative,  dsDNA +201,  RO + 127, RNP +99, SSB, Sm, SCl-70: negative  B2Gp1: negative  ACA IgM +12, IgG, IGA: negative  QTF gold: negative, Vitamin D: 30,  Spep: negative, Hep B and C neg    Imaging/Pathology:  Lymph node bx L neck (10/27/14) - reactive lymphoid hyperplasia, no monoclonal subpopulation.    CT Chest/abdomen/pelvis 09/18/14:  Bilateral axillary and subpectoral adenopathy. Is of indeterminate acuity, as the chest has not been imaged previously. Although these could be reactive in this patient with a history of lupus, lymphoproliferative process such as lymphoma is a concern.  2. Further retraction of left-sided renal ablation site, without evidence of locally recurrent or metastatic disease. 3. New or enlarged 8 mm left renal angiomyolipoma.    PET 10/03/14: 1. Small but hypermetabolic nodes within the neck, chest, and pelvis as detailed above. These remain suspicious for lymphoproliferative process such as lymphoma. inflammatory etiologies felt less likely. 2. Slightly degraded evaluation for cervical and upper thoracic hypermetabolic nodes, secondary to hypermetabolic Brown fat. 3. No findings to suggest recurrent or metastatic renal cell carcinoma.    MRI Brain 10/03/14:  1. No intracranial metastatic disease identified. 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  2. No acute intracranial abnormality. Mild for age nonspecific cerebral white matter signal changes have mildly progressed since 2010. These could be the sequelae of SLE. 3. Upper cervical lymph nodes bilaterally appear regressed since 2010.    12/2013: CXR - Normal lung volumes. No pleural effusions or edema. No evidence of pericardial effusion. Heart size within normal limits     04/2013: TTE: Normal left ventricular ejection fraction (60-65%); Diastolic left ventricular dysfunction; Degenerative mitral valve disease; Mitral valve prolapse; Mitral regurgitation (trivial); Dilated left atrium; Dilated thoracic aorta (see detail below); Normal right ventricular contractile performance; Tricuspid regurgitation (trivial); Elevated central venous and right atrial pressures    02/2014: Spirometry - FVC 2.81, 93%; FEV1 2.2, 90%; FEV1/FVC 95%; DLCO 45%    07/2015 - normal TTE    MRI brain/MRA head and neck 10/2015: normal    Total duration of the visit was 40 minutes, and > than 50 % of the visit was spent in face-to-face counseling focusing on diagnosis and therapeutic options.    Return in about 3 months (around 10/20/2017) for Follow-up SLE.

## 2017-07-20 NOTE — Unmapped (Signed)
[facility] - Pharmacist Counseling Notes    Reeda Soohoo is a 52 y.o. female being initiate on hydroxychloroquine for systemic lupus erythematosus. Medication list, allergies and comorbidities reviewed:  appropriate to initiate therapy. Aware of G6PD status and will monitor closely.     Regimen & Administration: Hydroxychloroquine 200 mg once daily.      Side-effects:    ?? Discussed risk for GI side effects, rash, anxiety, lack of appetite, weight loss, muscle weakness, hypoglycemia, mood changes, anemia, retinal toxicity and the need for routine dilated eye exam and ophtahlmologic consultation  ?? Counseled on signs of a significant drug reaction (wheezing, chest tightness, fever, itching, cough, blue skin color, seizures, swelling of face, lips, tongue or throat, etc)    Provided patient printed copy of CBC w/ diff order and she will have it done locally in 1 month.  Results will be faxed to Dr. Sullivan Lone.     Emphasized the importance of adherence to prescribed regimen, clinic follow-up visits, and laboratory testing.      All questions were answered and contact information provided for any future questions/concerns.      Jeneen Montgomery

## 2017-07-20 NOTE — Unmapped (Addendum)
Your physician today was Dr. Danella Maiers    Thank you for letting us be involved with your care!    1. Labs today.  It can take 10-14 days for all of the test results to come back. I will send you a MyChart message when they are complete.   2. We talked about starting plaquenil.    3. Check blood count one month after starting plaquenil.  4. Let your eye doctor know you are starting plaquenil and need a plaquenil eye exam.   5. You would like to wait on seeing sleep medicine for now.   6. Ask your primary care physician to fax [place] a copy of your vaccine record so we can see which pneumococcal vaccines you have had previously.   7. Flu vaccine: Already completed  8. Great job walking regularly!  You can continue to increase this.   9. Please activate your MyChart account if you have not already done so. You can send [place] non-urgent messages and we can send you your test results online.   10. If you have non-urgent questions, the best way to contact us is to send a MyChart message by visiting [email or web address].  You can also use MyChart to request refills and access test results.  I will do my best to respond within 2 business days, however occasionally it may take longer. If you have immediate concerns, please contact our clinic by phone [phone].    11. Next appointment: 3 months       Hydroxychloroquine (plaquenil) (Information from the [employer] of Rheumatology)    Hydroxychloroquine (Plaquenil) is considered a disease-modifying anti-rheumatic drug (DMARD). It can decrease the pain and swelling of arthritis. It may prevent joint damage and reduce the risk of long-term disability. Hydroxychloroquine is in a class of medications that was first used to prevent and treat malaria. Today, it is used to treat rheumatoid arthritis, some symptoms of lupus, childhood arthritis (or juvenile idiopathic arthritis) and other autoimmune diseases. It is not clear why hydroxychloroquine is effective at treating autoimmune diseases. It is believed that hydroxychloroquine interferes with the communication of cells in the immune system.    How to Take It  Hydroxychloroquine comes in an oral tablet. Adult dosing ranges from 200 mg or 400 mg per day (6.5mg /kg). In some cases, higher doses can be used. It is recommended one tablet twice daily if taking more than one tablet. It is recommended to be taken with food. Symptoms can start to improve in one to two months, but it may take up to six months before the full benefits of this medication are experienced.    Side Effects  Hydroxychloroquine typically is very well tolerated. Serious side effects are rare. The most common side effects are nausea and diarrhea, which often improve with time or by taking the medication with food. Less common side effects include rash, changes in skin pigment (such as darkening or dark spots), hair changes, and muscle weakness. Rarely, hydroxychloroquine can lead to anemia in some individuals. This can happen in individuals with a condition known as G6PD deficiency or porphyria.    In rare cases, hydroxychloroquine can cause visual changes or loss of vision. Such vision problems are more likely to occur in individuals taking high doses for many years, in individuals 60 years or older, or in those with significant kidney disease. The dose used today to treat arthritis is lower than doses used in the past to treat malaria. At the recommended dose, development of  visual problems due to the medication is rare. It is recommended that you have an eye exam every 6 - 12 months while on hydroxychloroquine therapy.    Tell Your Doctor  Although there are few drug interactions with hydroxychloroquine, to be safe be sure to tell your doctor about all of the medications you are taking, including over-the-counter medicines and natural remedies.    Be sure to notify your other physicians when taking this drug. This drug does not have a strong effect on the immune system, so vaccines recommended by other physicians are generally acceptable. Notify your eye doctor when you are on this medication so regular visual screening tests can be performed. If you are pregnant, considering becoming pregnant, or lactating, please discuss this with your doctor before taking this medication.    Updated March 2017 by Sande Rives, MD and reviewed by the [facility] of Rheumatology Committee on Communications and Marketing.    This information is provided for general education only. Individuals should consult a qualified health care provider for professional medical advice, diagnosis and treatment of a medical or health condition.    ?? 2017 [employer] of Rheumatology

## 2017-07-21 LAB — C4 COMPLEMENT: Chemistry studies:Cmplx:-:^Patient:Set:: 26.3

## 2017-07-23 NOTE — Unmapped (Signed)
Not all your labs are back, but so far the results are normal or with minor abnormalities without clinical significance.     I will continue to update you as results become available.     Dr. Sullivan Lone   07/22/2017

## 2017-07-25 LAB — DSDNA ANTIBODY: Lab: NEGATIVE

## 2017-07-26 DIAGNOSIS — G500 Trigeminal neuralgia: Secondary | ICD-10-CM | POA: Diagnosis not present

## 2017-07-26 DIAGNOSIS — G518 Other disorders of facial nerve: Secondary | ICD-10-CM | POA: Diagnosis not present

## 2017-07-26 DIAGNOSIS — G43719 Chronic migraine without aura, intractable, without status migrainosus: Secondary | ICD-10-CM | POA: Diagnosis not present

## 2017-07-26 DIAGNOSIS — M542 Cervicalgia: Secondary | ICD-10-CM | POA: Diagnosis not present

## 2017-07-26 DIAGNOSIS — G509 Disorder of trigeminal nerve, unspecified: Secondary | ICD-10-CM | POA: Diagnosis not present

## 2017-07-26 DIAGNOSIS — M791 Myalgia, unspecified site: Secondary | ICD-10-CM | POA: Diagnosis not present

## 2017-07-26 NOTE — Unmapped (Signed)
The last test we were waiting for was normal.  Let me know if you have any questions.    Dr. Sullivan Lone

## 2017-08-02 DIAGNOSIS — E559 Vitamin D deficiency, unspecified: Secondary | ICD-10-CM | POA: Diagnosis not present

## 2017-08-02 DIAGNOSIS — R7309 Other abnormal glucose: Secondary | ICD-10-CM | POA: Diagnosis not present

## 2017-08-02 DIAGNOSIS — Z1389 Encounter for screening for other disorder: Secondary | ICD-10-CM | POA: Diagnosis not present

## 2017-08-02 DIAGNOSIS — E782 Mixed hyperlipidemia: Secondary | ICD-10-CM | POA: Diagnosis not present

## 2017-08-02 DIAGNOSIS — M329 Systemic lupus erythematosus, unspecified: Secondary | ICD-10-CM | POA: Diagnosis not present

## 2017-08-02 DIAGNOSIS — F419 Anxiety disorder, unspecified: Secondary | ICD-10-CM | POA: Diagnosis not present

## 2017-08-02 DIAGNOSIS — Z0001 Encounter for general adult medical examination with abnormal findings: Secondary | ICD-10-CM | POA: Diagnosis not present

## 2017-08-02 DIAGNOSIS — M797 Fibromyalgia: Secondary | ICD-10-CM | POA: Diagnosis not present

## 2017-08-02 DIAGNOSIS — Z6826 Body mass index (BMI) 26.0-26.9, adult: Secondary | ICD-10-CM | POA: Diagnosis not present

## 2017-08-02 DIAGNOSIS — R002 Palpitations: Secondary | ICD-10-CM | POA: Diagnosis not present

## 2017-08-02 DIAGNOSIS — G43909 Migraine, unspecified, not intractable, without status migrainosus: Secondary | ICD-10-CM | POA: Diagnosis not present

## 2017-08-02 DIAGNOSIS — E049 Nontoxic goiter, unspecified: Secondary | ICD-10-CM | POA: Diagnosis not present

## 2017-08-03 NOTE — Unmapped (Signed)
Received letter from PCP Dr. Phillips Odor who kindly provided [place] with her vaccine records including vaccines listed below. Will update immunization record in Epic and send to scan. Recommend she receive prevnar PCV-13 at next clinic appt.      Flu 12-01-2016  Pneumovax 12-05-2014  TDap 07-21-2016     Danella Maiers, MD, MSCI  Aug 03, 2017   8:39 AM

## 2017-08-11 NOTE — Unmapped (Signed)
[facility] Refill Coordination Note    Specialty Medication(s) to be Shipped:   Inflammatory Disorders: Benlysta    Other medication(s) to be shipped: sharps container     Tonya Boyer, DOB: 1965/10/26  Phone: [phone] (home)   Shipping Address: [address]  [place] [address]    All above HIPAA information was verified with patient.     Completed refill call assessment today to schedule patient's medication shipment from the [facility] [phone]).       Specialty medication(s) and dose(s) confirmed: Regimen is correct and unchanged.   Changes to medications: Tonya Boyer reports no changes reported at this time.  Changes to insurance: No  Questions for the pharmacist: No    The patient will receive an FSI print out for each medication shipped and additional FDA Medication Guides as required.  Patient education from Tonya Boyer or Tonya Boyer may also be included in the shipment.    DISEASE-SPECIFIC INFORMATION        For Rheumatology patients: Next dose of benlysta from this shipment due on 7/7 or 7/14 (she knew she had one left not sure if she had 2 after today)    ADHERENCE     Medication Adherence    Patient reported X missed doses in the last month:  0  Specialty Medication:  benlysta  Patient is on additional specialty medications:  No  Patient is on more than two specialty medications:  No  Any gaps in refill history greater than 2 weeks in the last 3 months:  no  Demonstrates understanding of importance of adherence:  yes  Informant:  patient  Reliability of informant:  reliable  Confirmed plan for next specialty medication refill:  delivery by pharmacy  Refills needed for supportive medications:  not needed          Refill Coordination    Has the Patients' Contact Information Changed:  No  Is the Shipping Address Different:  No         SHIPPING     Shipping address confirmed in FSI.     Delivery Scheduled: Yes, Expected medication delivery date: 08/17/17 via UPS or courier.     Tonya Boyer   [facility] Specialty Technician

## 2017-08-16 MED FILL — BENLYSTA/200MG/ML/SOSY: BENLYSTA/200MG/ML/SOSY | 28 days supply | Qty: 4 | Fill #2

## 2017-08-16 MED FILL — SHARPS KIT/NA/MISC: SHARPS KIT/NA/MISC | 120 days supply | Qty: 1 | Fill #1

## 2017-09-06 DIAGNOSIS — G43719 Chronic migraine without aura, intractable, without status migrainosus: Secondary | ICD-10-CM | POA: Diagnosis not present

## 2017-09-06 DIAGNOSIS — M791 Myalgia, unspecified site: Secondary | ICD-10-CM | POA: Diagnosis not present

## 2017-09-06 DIAGNOSIS — M542 Cervicalgia: Secondary | ICD-10-CM | POA: Diagnosis not present

## 2017-09-06 DIAGNOSIS — G518 Other disorders of facial nerve: Secondary | ICD-10-CM | POA: Diagnosis not present

## 2017-09-19 NOTE — Unmapped (Signed)
[facility] Refill and Clinical Coordination Note  Medication(s): Benlysta 200mg /ml    Tonya Boyer, DOB: [birth year]  Phone: [phone] (home) , Alternate phone contact: N/A  Shipping address: [address]  [place] [address]  Phone or address changes today?: No  All above HIPAA information verified.  Insurance changes? No    Completed refill and clinical call assessment today to schedule patient's medication shipment from the [facility] [phone]).      MEDICATION RECONCILIATION    Confirmed the medication and dosage are correct and have not changed: Yes, regimen is correct and unchanged.    Were there any changes to your medication(s) in the past month:  No, there are no changes reported at this time.    ADHERENCE    Is this medicine transplant or covered by Medicare Part B? No.    Did you miss any doses in the past 4 weeks? No missed doses reported.  Adherence counseling provided? Not needed     SIDE EFFECT MANAGEMENT    Are you tolerating your medication?:  Tonya Boyer reports tolerating the medication.  Side effect management discussed: None      Therapy is appropriate and should be continued.    Evidence of clinical benefit: See Epic note from 07/20/17      FINANCIAL/SHIPPING    Delivery Scheduled: Yes, Expected medication delivery date: 09/21/17   Additional medications refilled: No additional medications/refills needed at this time.    The patient will receive an FSI print out for each medication shipped and additional FDA Medication Guides as required.  Patient education from [place] or Robet Leu may also be included in the shipment.    Tonya Boyer did not have any additional questions at this time.    Delivery address validated in FSI scheduling system: Yes, address listed above is correct.      We will follow up with patient monthly for standard refill processing and delivery.      Thank you,  Lupita Shutter   [facility] Specialty Pharmacist

## 2017-09-20 MED FILL — BENLYSTA/200MG/ML/SOSY: BENLYSTA/200MG/ML/SOSY | 28 days supply | Qty: 4 | Fill #3

## 2017-09-25 ENCOUNTER — Ambulatory Visit (HOSPITAL_COMMUNITY)
Admission: RE | Admit: 2017-09-25 | Discharge: 2017-09-25 | Disposition: A | Discharge status: Home / Self Care | Payer: BLUE CROSS/BLUE SHIELD | Source: Ambulatory Visit | Attending: Hematology and Oncology | Admitting: Hematology and Oncology

## 2017-09-25 ENCOUNTER — Encounter (HOSPITAL_COMMUNITY): Payer: Self-pay

## 2017-09-25 ENCOUNTER — Telehealth: Payer: Self-pay | Admitting: Hematology and Oncology

## 2017-09-25 ENCOUNTER — Other Ambulatory Visit: Payer: Self-pay | Admitting: Hematology and Oncology

## 2017-09-25 ENCOUNTER — Inpatient Hospital Stay: Payer: BLUE CROSS/BLUE SHIELD | Attending: Hematology and Oncology

## 2017-09-25 DIAGNOSIS — N2889 Other specified disorders of kidney and ureter: Secondary | ICD-10-CM | POA: Insufficient documentation

## 2017-09-25 DIAGNOSIS — D72818 Other decreased white blood cell count: Secondary | ICD-10-CM | POA: Diagnosis not present

## 2017-09-25 DIAGNOSIS — D1771 Benign lipomatous neoplasm of kidney: Secondary | ICD-10-CM | POA: Diagnosis not present

## 2017-09-25 DIAGNOSIS — M329 Systemic lupus erythematosus, unspecified: Secondary | ICD-10-CM | POA: Insufficient documentation

## 2017-09-25 DIAGNOSIS — Z79899 Other long term (current) drug therapy: Secondary | ICD-10-CM | POA: Insufficient documentation

## 2017-09-25 LAB — CBC WITH DIFFERENTIAL/PLATELET
BASOS ABS: 0 10*3/uL (ref 0.0–0.1)
BASOS PCT: 1 %
Eosinophils Absolute: 0.1 10*3/uL (ref 0.0–0.5)
Eosinophils Relative: 3 %
HEMATOCRIT: 36.3 % (ref 34.8–46.6)
HEMOGLOBIN: 12 g/dL (ref 11.6–15.9)
Lymphocytes Relative: 38 %
Lymphs Abs: 1 10*3/uL (ref 0.9–3.3)
MCH: 31.2 pg (ref 25.1–34.0)
MCHC: 33.2 g/dL (ref 31.5–36.0)
MCV: 94.2 fL (ref 79.5–101.0)
Monocytes Absolute: 0.5 10*3/uL (ref 0.1–0.9)
Monocytes Relative: 19 %
NEUTROS ABS: 1 10*3/uL — AB (ref 1.5–6.5)
NEUTROS PCT: 39 %
Platelets: 239 10*3/uL (ref 145–400)
RBC: 3.85 MIL/uL (ref 3.70–5.45)
RDW: 12.3 % (ref 11.2–14.5)
WBC: 2.6 10*3/uL — AB (ref 3.9–10.3)

## 2017-09-25 LAB — COMPREHENSIVE METABOLIC PANEL
ALT: 26 U/L (ref 0–44)
ANION GAP: 9 (ref 5–15)
AST: 27 U/L (ref 15–41)
Albumin: 4.2 g/dL (ref 3.5–5.0)
Alkaline Phosphatase: 171 U/L — ABNORMAL HIGH (ref 38–126)
BUN: 14 mg/dL (ref 6–20)
CO2: 26 mmol/L (ref 22–32)
Calcium: 9.7 mg/dL (ref 8.9–10.3)
Chloride: 108 mmol/L (ref 98–111)
Creatinine, Ser: 0.91 mg/dL (ref 0.44–1.00)
GFR calc non Af Amer: >60 mL/min (ref >60)
Glucose, Bld: 89 mg/dL (ref 70–99)
Potassium: 3.9 mmol/L (ref 3.5–5.1)
Sodium: 143 mmol/L (ref 135–145)
TOTAL PROTEIN: 8 g/dL (ref 6.5–8.1)
Total Bilirubin: 0.4 mg/dL (ref 0.3–1.2)

## 2017-09-25 MED ORDER — IOPAMIDOL (ISOVUE-300) INJECTION 61%
100.0000 mL | Freq: Once | INTRAVENOUS | Status: AC | PRN
  Administered 2017-09-25: 100 mL via INTRAVENOUS

## 2017-09-25 MED ORDER — IOPAMIDOL (ISOVUE-300) INJECTION 61%
INTRAVENOUS | Status: AC
  Filled 2017-09-25: qty 100

## 2017-09-25 NOTE — Telephone Encounter (Signed)
R/s 7/16 lab appt to 7/15  Per provider request.

## 2017-09-26 ENCOUNTER — Inpatient Hospital Stay (HOSPITAL_BASED_OUTPATIENT_CLINIC_OR_DEPARTMENT_OTHER): Payer: BLUE CROSS/BLUE SHIELD | Admitting: Hematology and Oncology

## 2017-09-26 ENCOUNTER — Telehealth: Payer: Self-pay | Admitting: Hematology and Oncology

## 2017-09-26 ENCOUNTER — Inpatient Hospital Stay: Payer: BLUE CROSS/BLUE SHIELD

## 2017-09-26 ENCOUNTER — Encounter: Payer: Self-pay | Admitting: Hematology and Oncology

## 2017-09-26 DIAGNOSIS — Z79899 Other long term (current) drug therapy: Secondary | ICD-10-CM

## 2017-09-26 DIAGNOSIS — N2889 Other specified disorders of kidney and ureter: Secondary | ICD-10-CM | POA: Diagnosis not present

## 2017-09-26 DIAGNOSIS — M329 Systemic lupus erythematosus, unspecified: Secondary | ICD-10-CM

## 2017-09-26 DIAGNOSIS — D72818 Other decreased white blood cell count: Secondary | ICD-10-CM

## 2017-09-26 NOTE — Assessment & Plan Note (Signed)
She has chronic stable SLE and I think that causes chronic intermittent lymphadenopathy and leukopenia. She appears to be responding well to treatment I would defer to her rheumatologist for further management

## 2017-09-26 NOTE — Assessment & Plan Note (Signed)
She had history of renal mass status post ablation. So far, she had no signs or symptoms of flank pain or hematuria With stable CT imaging, I recommend follow-up with urologist only

## 2017-09-26 NOTE — Assessment & Plan Note (Signed)
She has chronic intermittent leukopenia. It could be combination from her treatment for SLE, the SLE itself and also constitutional from her African-American heritage. Despite chronic leukopenia, she is relatively asymptomatic without the need for chronic antibiotic treatment. I recommend close observation only.

## 2017-09-26 NOTE — Progress Notes (Signed)
Dawn Marsh, Dawn Marsh, Dawn Marsh, Dawn Marsh, Dawn Marsh far, Dawn had no signs or symptoms of flank pain or hematuria With stable CT imaging, I recommend follow-up with urologist only  Marsh (systemic lupus erythematosus) Dawn has chronic stable Marsh and I think that causes chronic intermittent lymphadenopathy and Marsh. Dawn appears to be responding well to treatment I would defer to her rheumatologist for further management   No orders of Dawn defined types were placed in this encounter.   INTERVAL HISTORY: Dawn Marsh 52 y.o. female returns for further follow-up. Dawn feels well Denies flank pain or hematuria Appetite is stable Dawn is active According to Dawn patient, her systemic lupus is well under controlled although Dawn has occasional joint pain Dawn denies recurrent infection   SUMMARY OF HEMATOLOGIC HISTORY:  This patient had background history of systemic lupus. Dawn had history of severe, progressive pancytopenia with low white count and low hemoglobin. Methotrexate was discontinued and her medication was changed to [place]. Subsequently, Dawn changed her rheumatologist and Dawn was placed on methotrexate again, 15 mg weekly since July 2015, off by Dawn end of 2015 Her treatment was subsequently switched to Imuran since May 2016 Dawn patient had prior diagnosis of kidney cancer status post ablation. Dawn underwent CT scan at Dawn body in July 2016, MR of Dawn head and evaluation of lymphadenopathy which came back consistent with reactive LN.  I have  reviewed Dawn past medical history, past surgical history, social history and family history with Dawn patient and they are unchanged from previous note.  ALLERGIES:  is allergic to amoxicillin; amoxil [amoxicillin trihydrate]; penicillins; penicillins; and latex.  MEDICATIONS:  Current Outpatient Medications  Medication Sig Dispense Refill  . belimumab (BENLYSTA) 120 MG SOLR injection Inject 1 mL into Dawn vein once a week. 200 mg weekly    . dexamethasone (DECADRON) 10 MG/ML injection Inject 4 mLs into Dawn vein every 6 (six) weeks.     . DULoxetine (CYMBALTA) 20 MG capsule Take 20 mg by mouth daily.    . naproxen sodium (ALEVE) 220 MG tablet Take 220 mg by mouth daily as needed.    . Vitamin D, Ergocalciferol, (DRISDOL) 50000 UNITS CAPS capsule Take 50,000 Units by mouth every 7 (seven) days.     No current facility-administered medications for this visit.      REVIEW OF SYSTEMS:   Constitutional: Denies fevers, chills or night sweats Eyes: Denies blurriness of vision Ears, nose, mouth, throat, and face: Denies mucositis or sore throat Respiratory: Denies cough, dyspnea or wheezes Cardiovascular: Denies palpitation, chest discomfort or lower extremity swelling Gastrointestinal:  Denies nausea, heartburn or change in bowel habits Skin: Denies abnormal skin rashes Lymphatics: Denies new lymphadenopathy or easy bruising Neurological:Denies numbness, tingling or new weaknesses Behavioral/Psych: Mood is stable, no new changes  All other systems were reviewed with Dawn patient and are negative.  PHYSICAL EXAMINATION: ECOG PERFORMANCE STATUS: 0 - Asymptomatic  Vitals:   09/26/17 0940  BP: (!) 137/99  Pulse: 78  Resp: 18  Temp: 98.2 F (36.8 C)  SpO2: 100%   Filed Weights   09/26/17 0940  Weight: 154  lb 8 oz (70.1 kg)    GENERAL:alert, no distress and comfortable SKIN: skin color, texture, turgor are normal, no rashes or significant lesions EYES: normal, Conjunctiva are pink and  non-injected, sclera clear OROPHARYNX:no exudate, no erythema and lips, buccal mucosa, and tongue normal  NECK: supple, thyroid normal size, non-tender, without nodularity LYMPH:  no palpable lymphadenopathy in Dawn cervical, axillary or inguinal LUNGS: clear to auscultation and percussion with normal breathing effort HEART: regular rate & rhythm and no murmurs and no lower extremity edema ABDOMEN:abdomen soft, non-tender and normal bowel sounds Musculoskeletal:no cyanosis of digits and no clubbing  NEURO: alert & oriented x 3 with fluent speech, no focal motor/sensory deficits  LABORATORY DATA:  I have reviewed Dawn data as listed     Component Value Date/Time   NA 143 09/25/2017 0824   NA 141 09/10/2014 1445   K 3.9 09/25/2017 0824   K 3.9 09/10/2014 1445   CL 108 09/25/2017 0824   CO2 26 09/25/2017 0824   CO2 26 09/10/2014 1445   GLUCOSE 89 09/25/2017 0824   GLUCOSE 101 09/10/2014 1445   BUN 14 09/25/2017 0824   BUN 15.4 09/10/2014 1445   CREATININE 0.91 09/25/2017 0824   CREATININE 0.8 09/10/2014 1445   CALCIUM 9.7 09/25/2017 0824   CALCIUM 9.3 09/10/2014 1445   PROT 8.0 09/25/2017 0824   PROT 8.3 09/10/2014 1445   ALBUMIN 4.2 09/25/2017 0824   ALBUMIN 4.0 09/10/2014 1445   AST 27 09/25/2017 0824   AST 24 09/10/2014 1445   ALT 26 09/25/2017 0824   ALT 18 09/10/2014 1445   ALKPHOS 171 (H) 09/25/2017 0824   ALKPHOS 162 (H) 09/10/2014 1445   BILITOT 0.4 09/25/2017 0824   BILITOT 0.50 09/10/2014 1445   GFRNONAA >60 09/25/2017 0824   GFRAA >60 09/25/2017 0824    No results found for: SPEP, UPEP  Lab Results  Component Value Date   WBC 2.6 (L) 09/25/2017   NEUTROABS 1.0 (L) 09/25/2017   HGB 12.0 09/25/2017   HCT 36.3 09/25/2017   MCV 94.2 09/25/2017   PLT 239 09/25/2017      Chemistry      Component Value Date/Time   NA 143 09/25/2017 0824   NA 141 09/10/2014 1445   K 3.9 09/25/2017 0824   K 3.9 09/10/2014 1445   CL 108 09/25/2017 0824   CO2 26 09/25/2017  0824   CO2 26 09/10/2014 1445   BUN 14 09/25/2017 0824   BUN 15.4 09/10/2014 1445   CREATININE 0.91 09/25/2017 0824   CREATININE 0.8 09/10/2014 1445      Component Value Date/Time   CALCIUM 9.7 09/25/2017 0824   CALCIUM 9.3 09/10/2014 1445   ALKPHOS 171 (H) 09/25/2017 0824   ALKPHOS 162 (H) 09/10/2014 1445   AST 27 09/25/2017 0824   AST 24 09/10/2014 1445   ALT 26 09/25/2017 0824   ALT 18 09/10/2014 1445   BILITOT 0.4 09/25/2017 0824   BILITOT 0.50 09/10/2014 1445       RADIOGRAPHIC STUDIES: I have reviewed imaging study with Dawn patient I have personally reviewed Dawn radiological images as listed and agreed with Dawn findings in Dawn report. Ct Abdomen Pelvis W Wo Contrast  Result Date: 09/25/2017 CLINICAL DATA:  Dawn renal Marsh with cryoablation 2019. New Dawn renal Marsh. EXAM: CT ABDOMEN AND PELVIS WITHOUT AND WITH CONTRAST TECHNIQUE: Multidetector CT imaging of Dawn abdomen and pelvis was performed following Dawn standard protocol before and following Dawn bolus administration of intravenous contrast. CONTRAST:  173mL ISOVUE-300 IOPAMIDOL (ISOVUE-300) INJECTION 61% COMPARISON:  02/06/2017 and PET 10/03/2014. FINDINGS: Lower chest: Lung bases show mild scarring in Dawn Dawn lower lobe and lingula. Heart is at Dawn upper limits of normal in size. No pericardial or pleural effusion. Distal esophagus is grossly unremarkable. Hepatobiliary: Low-attenuation with peripheral contrast puddling in Dawn Dawn hepatic lobe measures 2.7 cm is indicative of a hemangioma. Probable additional subtle hemangioma in Dawn right hepatic lobe, measuring 1.3 cm. Liver and gallbladder are otherwise unremarkable. No biliary ductal dilatation. Pancreas: Negative. Spleen: Negative. Adrenals/Urinary Tract: Adrenal glands are unremarkable. Subcentimeter low-attenuation lesion in Dawn upper pole right kidney is too small to characterize. Mixed attenuation lesion off lower pole Dawn kidney has internal fat and shows  enhancement, measuring 1.5 x 2.1 cm, minimally larger from Dawn prior exam, at which time it measured 1.4 x 1.9 cm. There is scarring along Dawn lateral margin of Dawn interpolar Dawn kidney, related to previous cryoablation, with slight soft tissue thickening (series 4, image 25), unchanged. Kidneys are otherwise unremarkable. No urinary stones. Ureters are decompressed. Bladder is grossly unremarkable. Stomach/Bowel: Stomach, small bowel, appendix and colon are unremarkable. Vascular/Lymphatic: Vascular structures are unremarkable. No pathologically enlarged lymph nodes. Reproductive: Hysterectomy.  No adnexal Marsh. Other: No free fluid. Musculoskeletal: There are areas of sclerosis in Dawn Dawn L3 vertebral body, as before, and medial Dawn iliac wing, as on 10/03/2014. No worrisome lytic or sclerotic lesions. IMPRESSION: 1. Cryoablation defect along Dawn lateral aspect of Dawn interpolar Dawn kidney, without evidence of recurrent disease. 2. 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 Electronically Signed   By: Lorin Picket M.D.   On: 09/25/2017 10:16    I spent 15 minutes counseling Dawn patient face to face. Dawn total time spent in Dawn appointment was 20 minutes and more than 50% was on counseling.   All questions were answered. Dawn patient knows to call Dawn clinic with any problems, questions or concerns. No barriers to learning was detected.    Heath Lark, Dawn 7/16/20192:50 PM

## 2017-09-26 NOTE — Telephone Encounter (Signed)
Gave patient avs and calendar of upcoming July 2020 appts.  °

## 2017-10-04 ENCOUNTER — Telehealth: Payer: Self-pay | Admitting: *Deleted

## 2017-10-04 NOTE — Telephone Encounter (Signed)
Call received from [facility] in reference to CT scan results.  "I need results sent to my PCP Dr. [name], Rhematoogist Renetta Chalk at [facility] and Dr. Daryll Brod.  Want them to know because I'm concerned about my lungs, liver and kidneys.   Annaelle Dutkiewicz informed Dr. Rosanna Randy has access to Epic.  Routed 09-25-2017 Scan report as requested by patient.

## 2017-10-09 NOTE — Unmapped (Signed)
Patient would like a phone call from thuy to discuss something she has noticed with benlysta-she said it would be best for her to talk to thuy directly  Sending message to [facility]    [facility] Refill Coordination Note    Specialty Medication(s) to be Shipped:   Inflammatory Disorders: Benlysta    Other medication(s) to be shipped: n/a     [name], DOB: 1966/01/07  Phone: [phone] (home)   Shipping Address: [address]  [place] [address]    All above HIPAA information was verified with patient.     Completed refill call assessment today to schedule patient's medication shipment from the [facility] [phone]).       Specialty medication(s) and dose(s) confirmed: Regimen is correct and unchanged.   Changes to medications: Laniya reports no changes reported at this time.  Changes to insurance: No  Questions for the pharmacist: No    The patient will receive an FSI print out for each medication shipped and additional FDA Medication Guides as required.  Patient education from [place] or Robet Leu may also be included in the shipment.    DISEASE/MEDICATION-SPECIFIC INFORMATION        For Rheumatology patients: Next dose of benlysta from this shipment due on 8/9    ADHERENCE     Medication Adherence    Patient reported X missed doses in the last month:  0  Specialty Medication:  BENLYSTA  Patient is on additional specialty medications:  No  Patient is on more than two specialty medications:  No  Any gaps in refill history greater than 2 weeks in the last 3 months:  no  Demonstrates understanding of importance of adherence:  yes  Informant:  patient  Reliability of informant:  reliable  Confirmed plan for next specialty medication refill:  delivery by pharmacy  Refills needed for supportive medications:  not needed          Refill Coordination    Has the Patients' Contact Information Changed:  No  Is the Shipping Address Different:  No           SHIPPING     Shipping address confirmed in FSI.     Delivery Scheduled: Yes, Expected medication delivery date: 8/2 via UPS or courier.     [name]   [facility] Specialty Technician

## 2017-10-10 ENCOUNTER — Other Ambulatory Visit (HOSPITAL_COMMUNITY): Payer: Self-pay | Admitting: Interventional Radiology

## 2017-10-10 DIAGNOSIS — D1771 Benign lipomatous neoplasm of kidney: Secondary | ICD-10-CM

## 2017-10-12 MED FILL — BENLYSTA/200MG/ML/SOSY: BENLYSTA/200MG/ML/SOSY | 28 days supply | Qty: 4 | Fill #4

## 2017-10-18 DIAGNOSIS — M542 Cervicalgia: Secondary | ICD-10-CM | POA: Diagnosis not present

## 2017-10-18 DIAGNOSIS — G43719 Chronic migraine without aura, intractable, without status migrainosus: Secondary | ICD-10-CM | POA: Diagnosis not present

## 2017-10-18 DIAGNOSIS — G518 Other disorders of facial nerve: Secondary | ICD-10-CM | POA: Diagnosis not present

## 2017-10-18 DIAGNOSIS — M791 Myalgia, unspecified site: Secondary | ICD-10-CM | POA: Diagnosis not present

## 2017-10-25 ENCOUNTER — Other Ambulatory Visit: Payer: BLUE CROSS/BLUE SHIELD

## 2017-11-02 NOTE — Unmapped (Signed)
Patient is doing well at this time on benlysta  She has 2 doses left-one to use tomorrow    [facility] Refill Coordination Note    Specialty Medication(s) to be Shipped:   Inflammatory Disorders: Benlysta    Other medication(s) to be shipped: n/a     Tonya Boyer, DOB: Nov 09, 1965  Phone: [phone] (home)   Shipping Address: [address]  [place] [address]    All above HIPAA information was verified with patient.     Completed refill call assessment today to schedule patient's medication shipment from the [facility] [phone]).       Specialty medication(s) and dose(s) confirmed: Regimen is correct and unchanged.   Changes to medications: Tonya Boyer reports no changes reported at this time.  Changes to insurance: No  Questions for the pharmacist: No    The patient will receive a drug information handout for each medication shipped and additional FDA Medication Guides as required.      DISEASE/MEDICATION-SPECIFIC INFORMATION        For Rheumatology patients: Next dose of benlysta from this shipment due on 9/6    ADHERENCE     Medication Adherence    Patient reported X missed doses in the last month:  0  Specialty Medication:  benlysta  Patient is on additional specialty medications:  No  Patient is on more than two specialty medications:  No  Any gaps in refill history greater than 2 weeks in the last 3 months:  no  Demonstrates understanding of importance of adherence:  yes  Informant:  patient  Reliability of informant:  reliable  Confirmed plan for next specialty medication refill:  delivery by pharmacy  Refills needed for supportive medications:  not needed          Refill Coordination    Has the Patients' Contact Information Changed:  No  Is the Shipping Address Different:  No         SHIPPING     Shipping address confirmed in Epic.     Delivery Scheduled: Yes, Expected medication delivery date: 8/29 via UPS or courier.     Tonya Boyer   [facility] Specialty Technician

## 2017-11-08 MED FILL — BENLYSTA 200 MG/ML SUBCUTANEOUS SYRINGE: 28 days supply | Qty: 4 | Fill #0 | Status: AC

## 2017-11-08 MED FILL — BENLYSTA 200 MG/ML SUBCUTANEOUS SYRINGE: 28 days supply | Qty: 4 | Fill #0

## 2017-12-05 NOTE — Unmapped (Signed)
[facility] Refill Coordination Note  Specialty Medication(s): Benlysta 200 mg/mL   Additional Medications shipped: none    Tonya Boyer, DOB: [birth year]  Phone: [phone] (home) , Alternate phone contact: N/A  Phone or address changes today?: No  All above HIPAA information was verified with patient.  Shipping Address: [address]  [place] [address]   Insurance changes? No    Completed refill call assessment today to schedule patient's medication shipment from the [facility] [phone]).      Confirmed the medication and dosage are correct and have not changed: Yes, regimen is correct and unchanged.    Confirmed patient started or stopped the following medications in the past month:  No, there are no changes reported at this time.    Are you tolerating your medication?:  Tonya Boyer reports tolerating the medication.    ADHERENCE      Did you miss any doses in the past 4 weeks? No missed doses reported.    FINANCIAL/SHIPPING    Delivery Scheduled: Yes, Expected medication delivery date: 12/08/17     The patient will receive a drug information handout for each medication shipped and additional FDA Medication Guides as required.      Tonya Boyer did not have any additional questions at this time.    Delivery address validated in Epic.    We will follow up with patient monthly for standard refill processing and delivery.      Thank you,  Verdia Kuba   [facility] Specialty Pharmacist

## 2017-12-07 ENCOUNTER — Ambulatory Visit
Admission: RE | Admit: 2017-12-07 | Discharge: 2017-12-07 | Disposition: A | Discharge status: Home / Self Care | Payer: BLUE CROSS/BLUE SHIELD | Source: Ambulatory Visit | Attending: Interventional Radiology | Admitting: Interventional Radiology

## 2017-12-07 ENCOUNTER — Encounter: Payer: Self-pay | Admitting: Radiology

## 2017-12-07 DIAGNOSIS — D1771 Benign lipomatous neoplasm of kidney: Secondary | ICD-10-CM

## 2017-12-07 DIAGNOSIS — Z9889 Other specified postprocedural states: Secondary | ICD-10-CM | POA: Diagnosis not present

## 2017-12-07 HISTORY — PX: IR RADIOLOGIST EVAL & MGMT: IMG5224

## 2017-12-07 MED FILL — BENLYSTA 200 MG/ML SUBCUTANEOUS SYRINGE: 28 days supply | Qty: 4 | Fill #1

## 2017-12-07 MED FILL — BENLYSTA 200 MG/ML SUBCUTANEOUS SYRINGE: 28 days supply | Qty: 4 | Fill #1 | Status: AC

## 2017-12-07 NOTE — Progress Notes (Signed)
Patient ID: Dawn Marsh, female   DOB: 1965/05/14, 52 y.o.   MRN: [id number]       Chief Complaint:   2 cm left renal angiomyolipoma  Referring Physician(s): Nicolette Bang  History of Present Illness: Dawn Marsh is a 52 y.o. 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  She is known to our service after undergoing left renal ablation for a benign renal lesion in 2013.  She returns after surveillance imaging performed for the left renal angiomyolipoma.  Overall she remains asymptomatic.  No significant abdominal pain or flank pain.  No fever, dysuria or hematuria.  Past Medical History:  Diagnosis Date  . Anemia of other chronic disease 12/27/2012  . Anxiety   . Chest pain    constant-ongoing for years--sometimes eases up--but always comes back  . GERD (gastroesophageal reflux disease)    burning in stomach and gas--no med  . Goiter    sometimes trouble swallowing  . Heart murmur   . 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 Hypertension   . Left renal mass   . Leukopenia 12/27/2012  . Lupus ([place])   . Migraine   . Nontoxic simple goitre 12/27/2012  . Rhinitis, allergic    seasonal  . Seizures (HCC)    migraines  . SLE (systemic lupus erythematosus) ([place]) 12/27/2012  . Vertigo     Past Surgical History:  Procedure Laterality Date  . ABDOMINAL HYSTERECTOMY    . ABDOMINAL HYSTERECTOMY  2011  . IR RADIOLOGIST EVAL & MGMT  01/12/2017  . IR RADIOLOGIST EVAL & MGMT  02/07/2017  . IR RADIOLOGIST EVAL & MGMT  12/07/2017  . KIDNEY SURGERY    . MYOMECTOMY ABDOMINAL APPROACH    . myomectomy x 2 (prior to the hysterectomy)  2002 and 2006  . TUMOR REMOVAL      Allergies: Amoxicillin; Amoxil [amoxicillin trihydrate]; Penicillins; Penicillins; and Latex  Medications: Prior to Admission medications   Medication Sig Start Date End Date Taking? Authorizing Provider  belimumab (BENLYSTA) 120 MG SOLR injection Inject 1 mL into the vein once a week. 200  mg weekly   Yes [provider]  dexamethasone (DECADRON) 10 MG/ML injection Inject 4 mLs into the vein every 6 (six) weeks.    Yes [provider]  DULoxetine (CYMBALTA) 20 MG capsule Take 20 mg by mouth daily. 05/05/16  Yes [provider]  naproxen sodium (ALEVE) 220 MG tablet Take 220 mg by mouth daily as needed.   Yes [provider]     No family history on file.  Social History   Socioeconomic History  . Marital status: Divorced    Spouse name: Not on file  . Number of children: Not on file  . Years of education: Not on file  . Highest education level: Not on file  Occupational History  . Not on file  Social Needs  . Financial resource strain: Not on file  . Food insecurity:    Worry: Not on file    Inability: Not on file  . Transportation needs:    Medical: Not on file    Non-medical: Not on file  Tobacco Use  . Smoking status: Never Smoker  . Smokeless tobacco: Never Used  Substance and Sexual Activity  . Alcohol use: Yes    Comment: occas-maybe once a month  . Drug use: No  . Sexual activity: Not on file  Lifestyle  . Physical activity:    Days per week: Not on file  Minutes per session: Not on file  . Stress: Not on file  Relationships  . Social connections:    Talks on phone: Not on file    Gets together: Not on file    Attends religious service: Not on file    Active member of club or organization: Not on file    Attends meetings of clubs or organizations: Not on file    Relationship status: Not on file  Other Topics Concern  . Not on file  Social History Narrative   ** Merged History Encounter **        Review of Systems: A 12 point ROS discussed and pertinent positives are indicated in the HPI above.  All other systems are negative.  Review of Systems  Vital Signs: BP 118/82   Pulse 82   Temp 98 F (36.7 C) (Oral)   Resp 14   Ht 5\' 5"  (1.651 m)   Wt 69.4 kg   SpO2 100%   BMI 25.46 kg/m   Physical  Exam  Constitutional: She is oriented to person, place, and time. She appears well-developed and well-nourished. No distress.  Eyes: Conjunctivae are normal. No scleral icterus.  Cardiovascular: Normal rate and regular rhythm.  Pulmonary/Chest: Effort normal and breath sounds normal.  Abdominal: Soft. Bowel sounds are normal. She exhibits no mass. There is no tenderness.  Neurological: She is alert and oriented to person, place, and time.  Skin: She is not diaphoretic.  Psychiatric: She has a normal mood and affect.     Imaging: Ir Radiologist Eval & Mgmt  Result Date: 12/07/2017 Please refer to notes tab for details about interventional procedure. (Op Note)   Labs:  CBC: Recent Labs    09/25/17 0824  WBC 2.6*  HGB 12.0  HCT 36.3  PLT 239    COAGS: No results for input(s): INR, APTT in the last 8760 hours.  BMP: Recent Labs    02/06/17 0744 09/25/17 0824  NA  --  143  K  --  3.9  CL  --  108  CO2  --  26  GLUCOSE  --  89  BUN  --  14  CALCIUM  --  9.7  CREATININE 0.90 0.91  GFRNONAA  --  >60  GFRAA  --  >60    LIVER FUNCTION TESTS: Recent Labs    09/25/17 0824  BILITOT 0.4  AST 27  ALT 26  ALKPHOS 171*  PROT 8.0  ALBUMIN 4.2    Assessment and Plan:  Surveillance CT imaging with contrast redemonstrates the left kidney lower pole exophytic angiomyolipoma measuring 15 x 21 mm, previously 14 x 19 mm.  This minimal change in measurements may be related to interobserver variability.  No gross significant interval enlargement or change in enhancement characteristics.  Lesion remains consistent with a benign angiomyolipoma.  No signs of interval internal hemorrhage or retroperitoneal spontaneous hemorrhage.  Stable ablation defect in the left kidney upper pole.  Plan: Asymptomatic 2 cm left lower pole renal angiomyolipoma.  Continue annual surveillance with a abdomen CT without and with contrast.    Electronically Signed: Joanie Duprey 12/07/2017,  1:51 PM   I spent a total of    25 Minutes in face to face in clinical consultation, greater than 50% of which was counseling/coordinating care for this patient with a 2 cm a cinematic left angiomyolipoma

## 2017-12-14 ENCOUNTER — Ambulatory Visit: Admit: 2017-12-14 | Discharge: 2017-12-15 | Payer: MEDICARE

## 2017-12-14 DIAGNOSIS — Z79899 Other long term (current) drug therapy: Secondary | ICD-10-CM | POA: Diagnosis not present

## 2017-12-14 DIAGNOSIS — F329 Major depressive disorder, single episode, unspecified: Secondary | ICD-10-CM | POA: Diagnosis not present

## 2017-12-14 DIAGNOSIS — R0789 Other chest pain: Secondary | ICD-10-CM | POA: Diagnosis not present

## 2017-12-14 DIAGNOSIS — Z6825 Body mass index (BMI) 25.0-25.9, adult: Secondary | ICD-10-CM | POA: Diagnosis not present

## 2017-12-14 DIAGNOSIS — M329 Systemic lupus erythematosus, unspecified: Secondary | ICD-10-CM | POA: Diagnosis not present

## 2017-12-14 DIAGNOSIS — Z7189 Other specified counseling: Secondary | ICD-10-CM | POA: Diagnosis not present

## 2017-12-14 DIAGNOSIS — Z23 Encounter for immunization: Secondary | ICD-10-CM | POA: Diagnosis not present

## 2017-12-14 LAB — URINALYSIS WITH CULTURE REFLEX
BILIRUBIN UA: NEGATIVE
GLUCOSE UA: NEGATIVE
HYALINE CASTS: 1 /LPF (ref 0–1)
NITRITE UA: NEGATIVE
PH UA: 5.5 (ref 5.0–9.0)
PROTEIN UA: NEGATIVE
RBC UA: <1 /HPF (ref <=4)
RENAL TUBULAR EPITHELIAL CELLS: <1 /HPF — ABNORMAL HIGH
SPECIFIC GRAVITY UA: 1.015 (ref 1.003–1.030)
UROBILINOGEN UA: 0.2
WBC UA: 7 /HPF — ABNORMAL HIGH (ref 0–5)

## 2017-12-14 LAB — C3 COMPLEMENT: Complement C3:MCnc:Pt:Ser/Plas:Qn:: 101

## 2017-12-14 LAB — COMPREHENSIVE METABOLIC PANEL
ALBUMIN: 4.5 g/dL (ref 3.5–5.0)
ALKALINE PHOSPHATASE: 170 U/L — ABNORMAL HIGH (ref 38–126)
ANION GAP: 9 mmol/L (ref 7–15)
AST (SGOT): 29 U/L (ref 17–47)
BILIRUBIN TOTAL: 0.3 mg/dL (ref 0.0–1.2)
BLOOD UREA NITROGEN: 18 mg/dL (ref 7–21)
BUN / CREAT RATIO: 22
CALCIUM: 9.7 mg/dL (ref 8.5–10.2)
CHLORIDE: 104 mmol/L (ref 98–107)
CREATININE: 0.83 mg/dL (ref 0.60–1.00)
EGFR CKD-EPI AA FEMALE: >90 mL/min/{1.73_m2} (ref >=60)
EGFR CKD-EPI NON-AA FEMALE: 82 mL/min/{1.73_m2} (ref >=60)
GLUCOSE RANDOM: 83 mg/dL (ref 65–179)
POTASSIUM: 4.3 mmol/L (ref 3.5–5.0)
PROTEIN TOTAL: 8.2 g/dL (ref 6.5–8.3)
SODIUM: 141 mmol/L (ref 135–145)

## 2017-12-14 LAB — CBC W/ AUTO DIFF
BASOPHILS ABSOLUTE COUNT: 0 10*9/L (ref 0.0–0.1)
BASOPHILS RELATIVE PERCENT: 0.6 %
EOSINOPHILS ABSOLUTE COUNT: 0.1 10*9/L (ref 0.0–0.4)
EOSINOPHILS RELATIVE PERCENT: 2.4 %
HEMATOCRIT: 39 % (ref 36.0–46.0)
HEMOGLOBIN: 12.4 g/dL (ref 12.0–16.0)
LARGE UNSTAINED CELLS: 5 % — ABNORMAL HIGH (ref 0–4)
LYMPHOCYTES ABSOLUTE COUNT: 0.8 10*9/L — ABNORMAL LOW (ref 1.5–5.0)
LYMPHOCYTES RELATIVE PERCENT: 24 %
MEAN CORPUSCULAR HEMOGLOBIN CONC: 31.8 g/dL (ref 31.0–37.0)
MEAN CORPUSCULAR HEMOGLOBIN: 30.2 pg (ref 26.0–34.0)
MEAN CORPUSCULAR VOLUME: 95 fL (ref 80.0–100.0)
MONOCYTES ABSOLUTE COUNT: 0.3 10*9/L (ref 0.2–0.8)
MONOCYTES RELATIVE PERCENT: 9.6 %
NEUTROPHILS ABSOLUTE COUNT: 1.9 10*9/L — ABNORMAL LOW (ref 2.0–7.5)
NEUTROPHILS RELATIVE PERCENT: 58.3 %
PLATELET COUNT: 280 10*9/L (ref 150–440)
RED BLOOD CELL COUNT: 4.11 10*12/L (ref 4.00–5.20)
WBC ADJUSTED: 3.3 10*9/L — ABNORMAL LOW (ref 4.5–11.0)

## 2017-12-14 LAB — PROTEIN / CREATININE RATIO, URINE: CREATININE, URINE: 105.9 mg/dL

## 2017-12-14 LAB — NEUTROPHILS RELATIVE PERCENT: Lab: 58.3

## 2017-12-14 LAB — BLOOD UA: Lab: NEGATIVE

## 2017-12-14 LAB — C4 COMPLEMENT: Complement C4:MCnc:Pt:Ser/Plas:Qn:: 27.7

## 2017-12-14 LAB — PROTEIN URINE: Protein:MCnc:Pt:Urine:Qn:: 5.1

## 2017-12-14 LAB — ALBUMIN: Albumin:MCnc:Pt:Ser/Plas:Qn:: 4.5

## 2017-12-14 NOTE — Unmapped (Signed)
Last Clinic Visit: May 2019    RHEUMATOLOGY FOLLOW UP NOTE    Reason for visit: f/u SLE    Assessment and Plan:   The patient is a 51 y.o. AAF with SLE (dx 2014) characerized by fatigue, leukopenia, arthralgia, pleurisy, reactive LAD, brain fog, +ANA, +dsDNA, +Ro, +RNP, low titer aCL IgM currently on belimumab SQ weekly who presents for follow up.    SLE: currently on belimumab SQ with overall stability in her symptoms including improvement in pleurisy, arthralgia, brain fog. No evidence of inflammatory arthritis, rash today though has had some episodes of chest pain that may have been consistent with pleuritic chest pain.   - Labs today to monitor for SLE activity.    - Continue benlysta SQ weekly injection  - Had discussed starting plaquenil at last appt, did not start in part because she was concerned about additional immune suppression and interaction with benlysta. She is not interested today, though I still think she would benefit from starting plaquenil 200 mg daily.     High risk medication monitoring: Benlysta  - Patient is currently taking Benlysta, an immunosuppressant medication that requires intensive monitoring. This monitoring was done today through history, physical, and/or lab testing.   - No recent infections or hospitalizations.   - Will check CBC and CMP today.        Dizziness and HA/brain fog/anxiety: MRI brain and MRA head and neck normal Aug 2017. Neurocognitive testing in 07/2015 with mild impairment but likely multifactorial including SLE, fatigue, anxiety/depression. May be exacerbated by poor sleep.   - Pt declines referral to sleep medicine (neuro) again today.     Fibromalgia: I think some of her symptoms may be due to fibromyalgia.   - Encouraged good restorative sleep, reviewed sleep hygiene. Pt declined referral to sleep specialist today.    - Encouraged her to continue to walk regularly.     Intermittent pleurisy/PFTs with isolated low DLCO: Pleurisy likely due to SLE now resolved, TTE in 2015 with diastolic dysfunction, mitral valve disease, mild dilated LA and thoracic aorta. Seen by cardiology in 02/2015 who recommended f/u TTE every 2-3 years. PFTs normal spirometry but low DLCO. TTE 07/2015 normal  --TTE every 2-3 years  ??  Vitamin D deficiency: cont supplementation.     Immunization Counseling:   - Flu vaccine: Sept 2019  - Prevnar PCV-13: Today Oct 2019  - Pneumovax PPSV-23: Sept 2016    Disability: Pt continues to feel she is unable to work, concerned that her disease would flare if working regularly and would be too exhausted, unable to work regularly with frequent flares. Too much fatigue if does more than minimal activities for a few days. Continues to have difficulty sleeping.   - Completed paperwork for pt.     Total duration of the visit was 30 minutes, and > than 50 % of the visit was spent in face-to-face counseling focusing on diagnosis and therapeutic options.    Return in about 9 months (around 09/14/2018).    Danella Maiers, MD, MSCI  Assistant Professor of Medicine  Department of Medicine/Division of Rheumatology  [facility]  [phone] clinic phone  [phone] clinic secure fax     -------------------------------------------------------------------------------------------------------------------------    HPI:   52 y.o. AAF with SLE (dx 2014) characerized by fatigue, leukopenia, arthralgias, pleurisy, reactive LAD, fatigue, brain fog, +ANA, +dsDNA, +Ro, +RNP, low titer aCL IgM who presents for follow-up visit of SLE.  In brief, upon diagnosis in 2014 the patient was started on MTX [place] with significant improvement in arthralgia, brain fog, fatigue but  DC'd in 04/2014 2/2 mild elevation in LFTs and leukopenia (ANC <1),  leflunomide tried without improvement in joint pain. G6PD markedly low, thus plaquenil not started due to concern for hemolytic anemia. Has not been able to tolerate steroids (even low dose) 2/2 anxiety. Trial of azathioprine started in 06/2014 with close monitoring of counts 2/2 low normal TPMT but DC'd 2/2 worsening leukopenia (WBC 2.4, ANC 1) in beginning of 11/2014. In 12/2014, the patient was started on belimumab. Of note, the patient has had persistently enlarged LAD - work up including PET CT with hypermetabolic LAD in neck, chest, pelvis. SLE vs. Lymphoma. Had bx 10/2014 by ENT of L neck node - reactive hyperplasia, no evidence of malignancy.    In 07/2015, the patient was seen by Dr. Janee Morn for neurocognitive testing which revealed mild impairment consistent with a patient with SLE and was felt that fatigue was playing a role in her reduced cognitive efficiency. Recommended going to counseling as well due to adjustment to disability and depression/anxiety. TTE 07/2015 also normal. Sent for MRI brain/MRA head and neck which were normal 10/2015.     Interval History:  - Remains on benlysta SQ every 7 days.   - Did not start plaquenil - did not want any more medications, concerned about taking with benlysta and having more immune suppresion.   - Continues to have pains in her chest/sternum mostly on her L side.  Can happen any time, sometimes lasts for weeks, had for past 3 weeks now resolving.  Other times can have a sharpness.  Not positional.  Not worse with deep breath.    - Recently had another episode of breathing problems and pain, treated with heating pad which helped.    - Feels better when she works out, tries to work out daily but not past 2 weeks.  Hard to walk in heat - plans to start going to track now that cooling down.    - Has been having more stiffness, all over, but not had major stiffness recently.    - Still having fatigue but sporadic - feels OK for 1-2 weeks, then out of nowhere feels sick and weak all over.   - Some dry mouth and eyes.     - No alopecia, rash, photosensitivity, oral or nasal ulcers, or Raynaud's phenomenon.   - Pain in calves sometimes.    - Has dental appt in a few weeks Record review:  I have reviewed the patient's allergies, medications, pertinent past medical, surgical, social and family history and have updated in Epic where appropriate. I have also reviewed the pertinent records including notes, labs, and imaging tests in the medical record and Care Everywhere.     Review of Systems: All other systems reviewed were negative except as noted above.      Physical Exam:  Vitals:    12/14/17 1204   BP: 129/86   BP Site: L Arm   BP Position: Sitting   BP Cuff Size: Medium   Pulse: 91   Temp: 36.1 ??C (97 ??F)   TempSrc: Oral   Weight: 70.4 kg (155 lb 1.6 oz)   Height: 165.1 cm (5' 5)   Body mass index is 25.81 kg/m??.      GENERAL: Well developed female in no acute distress  HEENT: PERRL without injection, EOMI, sclera anicteric, OP clear without ulceration or  thrush, parotids not enlarged  LYMPH: no cervical lymphadenopathy  CARDIAC: HRR, no murmurs.    Chest: CTA-B.  Breathing non-labored. Anterior chest diffusely TTP.   GI: soft, non-tender, non-distended, no HSM.   EXT: no lower extremity edema, warm well perfused.   SKIN: no rash, no raynaud's.   MSK: full range of motion of shoulder, elbow, wrist, hips, knees and ankles. No synovitis of the small joints of the hands or feet. No joint deformities, no effusions, no warmth.  Except: Bilat upper arms diffusely TTP.   NEURO: alert and oriented, normal gait, no focal deficits, CN2-12 grossly intact. Gait normal.     Labs:  Reviewed labs from May 2018.    10/2012:   G6PD: 1.7 (low. nml range 7-20); IgG 2380, IgA 478, IgM 205; C3 96, C4 14  RF 20+; ANA + 1;1280 speckled, CCP: negative,  dsDNA +201,  RO + 127, RNP +99, SSB, Sm, SCl-70: negative  B2Gp1: negative  ACA IgM +12, IgG, IGA: negative  QTF gold: negative, Vitamin D: 30,  Spep: negative, Hep B and C neg    Imaging/Pathology:  Lymph node bx L neck (10/27/14) - reactive lymphoid hyperplasia, no monoclonal subpopulation.    CT Chest/abdomen/pelvis 09/18/14:  Bilateral axillary and subpectoral adenopathy. Is of indeterminate acuity, as the chest has not been imaged previously. Although these could be reactive in this patient with a history of lupus, lymphoproliferative process such as lymphoma is a concern.  2. Further retraction of left-sided renal ablation site, without evidence of locally recurrent or metastatic disease. 3. New or enlarged 8 mm left renal angiomyolipoma.    PET 10/03/14: 1. Small but hypermetabolic nodes within the neck, chest, and pelvis as detailed above. These remain suspicious for lymphoproliferative process such as lymphoma. inflammatory etiologies felt less likely. 2. Slightly degraded evaluation for cervical and upper thoracic hypermetabolic nodes, secondary to hypermetabolic Brown fat. 3. No findings to suggest recurrent or metastatic renal cell carcinoma.    MRI Brain 10/03/14:  1. No intracranial metastatic disease identified. 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  2. No acute intracranial abnormality. Mild for age nonspecific cerebral white matter signal changes have mildly progressed since 2010. These could be the sequelae of SLE. 3. Upper cervical lymph nodes bilaterally appear regressed since 2010.    12/2013: CXR - Normal lung volumes. No pleural effusions or edema. No evidence of pericardial effusion. Heart size within normal limits     04/2013: TTE: Normal left ventricular ejection fraction (60-65%); Diastolic left ventricular dysfunction; Degenerative mitral valve disease; Mitral valve prolapse; Mitral regurgitation (trivial); Dilated left atrium; Dilated thoracic aorta (see detail below); Normal right ventricular contractile performance; Tricuspid regurgitation (trivial); Elevated central venous and right atrial pressures    02/2014: Spirometry - FVC 2.81, 93%; FEV1 2.2, 90%; FEV1/FVC 95%; DLCO 45%    07/2015 - normal TTE    MRI brain/MRA head and neck 10/2015: normal

## 2017-12-14 NOTE — Unmapped (Addendum)
Your physician today was Dr. Danella Maiers    Thank you for letting us be involved with your care!    1. Labs today.  It can take 10-14 days for all of the test results to come back. I will send you a MyChart message when they are complete.   2. We talked about starting plaquenil - you prefer to wait on this.    3. You would like to wait on seeing sleep medicine for now.   4. Recommend echo before your next appointment here.    5. Flu vaccine: Already completed  6. Pneumococcal vaccine: Prevnar PCV-13 today.   7. If you have non-urgent questions, the best way to contact us is to send a MyChart message by visiting [email or web address].  You can also use MyChart to request refills and access test results.  I will do my best to respond within 2 business days, however occasionally it may take longer. If you have immediate concerns, please contact our clinic by phone [phone].    8. Next appointment: 9 months

## 2017-12-18 DIAGNOSIS — M791 Myalgia, unspecified site: Secondary | ICD-10-CM | POA: Diagnosis not present

## 2017-12-18 DIAGNOSIS — G518 Other disorders of facial nerve: Secondary | ICD-10-CM | POA: Diagnosis not present

## 2017-12-18 DIAGNOSIS — G43719 Chronic migraine without aura, intractable, without status migrainosus: Secondary | ICD-10-CM | POA: Diagnosis not present

## 2017-12-18 DIAGNOSIS — M542 Cervicalgia: Secondary | ICD-10-CM | POA: Diagnosis not present

## 2017-12-18 NOTE — Unmapped (Signed)
Not all your labs are back, but so far the results are normal or with minor abnormalities without clinical significance.     I will continue to update you as results become available.     Dr. Sullivan Lone   12/17/2017

## 2017-12-19 LAB — DSDNA ANTIBODY: Lab: NEGATIVE

## 2017-12-24 NOTE — Unmapped (Signed)
The last test we were waiting for was normal.  Let me know if you have any questions.    Dr. Sullivan Lone

## 2017-12-28 NOTE — Unmapped (Signed)
[facility] Specialty Pharmacy Refill Coordination Note  Specialty Medication(s): benlysta  Additional Medications shipped: none    Tonya Boyer, DOB: November 30, 1965  Phone: [phone] (home) , Alternate phone contact: N/A  Phone or address changes today?: No  All above HIPAA information was verified with patient.  Shipping Address: [address] [place] [address]- this address is listed as rx address in wam  Insurance changes? No    Completed refill call assessment today to schedule patient's medication shipment from the [facility] [phone]).      Confirmed the medication and dosage are correct and have not changed: Yes, regimen is correct and unchanged.    Confirmed patient started or stopped the following medications in the past month:  No, there are no changes reported at this time.    Are you tolerating your medication?:  Jilleen reports tolerating the medication.    ADHERENCE    Patient will need this shipment for injection in 14 days.    Did you miss any doses in the past 4 weeks? No missed doses reported.    FINANCIAL/SHIPPING    Delivery Scheduled: Yes, Expected medication delivery date: 01/04/18     Medication will be delivered via UPS to the prescription address in [facility].    The patient will receive a drug information handout for each medication shipped and additional FDA Medication Guides as required.      Patient was concerned she had injection site reactions from recent flu and pneumonia vaccine. she said red and itchy, but much better now, just looks like a bruise. I advised her to watch area and make sure not spreading, hot, fever/chills, etc (if so, seek medical attention), but also to report to clinic- it may just be a regular reaction to vaccines, but would be good for them to know and document in chart. I will message clinic today as well.     We will follow up with patient monthly for standard refill processing and delivery.      Thank you,  Thad Ranger   [facility] Shared [facility] Specialty Pharmacist

## 2018-01-01 ENCOUNTER — Telehealth: Payer: Self-pay | Admitting: Hematology and Oncology

## 2018-01-01 NOTE — Telephone Encounter (Signed)
Faxed medical records to [facility] with [employer] @ [phone]. Release ID# [id number]

## 2018-01-03 NOTE — Unmapped (Signed)
Tonya Boyer 's BENLYSTA shipment will be delayed due to Prior Authorization Required We have contacted the patient and left a message We will call the patient to reschedule the delivery upon resolution. We have confirmed the delivery date as N/A .

## 2018-01-04 MED FILL — BENLYSTA 200 MG/ML SUBCUTANEOUS SYRINGE: 28 days supply | Qty: 4 | Fill #2 | Status: AC

## 2018-01-04 MED FILL — BENLYSTA 200 MG/ML SUBCUTANEOUS SYRINGE: 28 days supply | Qty: 4 | Fill #2

## 2018-01-04 NOTE — Unmapped (Signed)
Tonya Boyer 's BENLYSTA shipment will be delayed due to Prior Authorization Required We have contacted the patient to reschedule the delivery. We have confirmed the delivery date as Friday, January 05, 2018.  Marletta Lor, Pharmacist  [facility]  [phone]

## 2018-01-25 MED ORDER — BELIMUMAB 200 MG/ML SUBCUTANEOUS SYRINGE
4 refills | 0 days | Status: CP
Start: 2018-01-25 — End: 2018-06-14
  Filled 2018-01-29: qty 4, 28d supply, fill #0

## 2018-01-25 MED ORDER — EMPTY CONTAINER
PRN refills | 0 days
Start: 2018-01-25 — End: ?

## 2018-01-26 NOTE — Unmapped (Signed)
[facility] Special[facility] Pharmacy Refill Coordination Note    Specialty Medication(s) to be Shipped:   Inflammatory Disorders: Benlysta    Other medication(s) to be shipped: empty container Misc       Tonya Boyer, DOB: 01/11/1966  Phone: [phone] (home)       All above HIPAA information was verified with patient.     Completed refill call assessment today to schedule patient's medication shipment from the [facility] [phone]).       Specialty medication(s) and dose(s) confirmed: Regimen is correct and unchanged.   Changes to medications: [name] reports no changes reported at this time.  Changes to insurance: No  Questions for the pharmacist: No    The patient will receive a drug information handout for each medication shipped and additional FDA Medication Guides as required.      DISEASE/MEDICATION-SPECIFIC INFORMATION        N/A    ADHERENCE     Medication Adherence    Patient reported X missed doses in the last month:  0                          MEDICARE PART B DOCUMENTATION     BENLYSTA: Patient has 1 week worth of on hand.    SHIPPING     Shipping address confirmed in Epic.     Delivery Scheduled: Yes, Expected medication delivery date: 01/30/18 via UPS or courier.     Medication will be delivered via UPS to the home address in Epic WAM.    [place] A Pammie Chirino   [facility] Shared [facility] Specialty Technician

## 2018-01-29 MED FILL — EMPTY CONTAINER: 28 days supply | Qty: 1 | Fill #0 | Status: AC

## 2018-01-29 MED FILL — EMPTY CONTAINER: 28 days supply | Qty: 1 | Fill #0

## 2018-01-29 MED FILL — BENLYSTA 200 MG/ML SUBCUTANEOUS SYRINGE: 28 days supply | Qty: 4 | Fill #0 | Status: AC

## 2018-02-20 DIAGNOSIS — G518 Other disorders of facial nerve: Secondary | ICD-10-CM | POA: Diagnosis not present

## 2018-02-20 DIAGNOSIS — M791 Myalgia, unspecified site: Secondary | ICD-10-CM | POA: Diagnosis not present

## 2018-02-20 DIAGNOSIS — G43719 Chronic migraine without aura, intractable, without status migrainosus: Secondary | ICD-10-CM | POA: Diagnosis not present

## 2018-02-20 DIAGNOSIS — M542 Cervicalgia: Secondary | ICD-10-CM | POA: Diagnosis not present

## 2018-02-24 MED ORDER — DESONIDE 0.05 % TOPICAL CREAM
Freq: Two times a day (BID) | TOPICAL | 0 refills | 0.00000 days | Status: CP
Start: 2018-02-24 — End: 2019-02-24

## 2018-02-26 NOTE — Unmapped (Signed)
[facility] Specialty Pharmacy Refill Coordination Note  Specialty Medication(s): Benlysta 200 mg/mL  Additional Medications shipped: none    [name], DOB: [birth year]  Phone: [phone] (home) , Alternate phone contact: N/A  Phone or address changes today?: No  All above HIPAA information was verified with patient.  Shipping Address: [address]  [place] [address]   Insurance changes? No    Completed refill call assessment today to schedule patient's medication shipment from the [facility] [phone]).      Confirmed the medication and dosage are correct and have not changed: Yes, regimen is correct and unchanged.    Confirmed patient started or stopped the following medications in the past month:  No, there are no changes reported at this time.    Are you tolerating your medication?:  Daniell reports tolerating the medication.    ADHERENCE    Did you miss any doses in the past 4 weeks? No missed doses reported.    FINANCIAL/SHIPPING    Delivery Scheduled: Yes, Expected medication delivery date: 02/28/18     Medication will be delivered via UPS to the home address in [facility].    The patient will receive a drug information handout for each medication shipped and additional FDA Medication Guides as required.      Nelline did not have any additional questions at this time.    We will follow up with patient monthly for standard refill processing and delivery.      Thank you,  Verdia Kuba   [facility] Shared [facility] Specialty Pharmacist

## 2018-02-27 MED FILL — BENLYSTA 200 MG/ML SUBCUTANEOUS SYRINGE: 28 days supply | Qty: 4 | Fill #1

## 2018-02-27 MED FILL — BENLYSTA 200 MG/ML SUBCUTANEOUS SYRINGE: 28 days supply | Qty: 4 | Fill #1 | Status: AC

## 2018-03-13 ENCOUNTER — Ambulatory Visit: Admit: 2018-03-13 | Discharge: 2018-03-14 | Payer: MEDICARE

## 2018-03-13 DIAGNOSIS — M329 Systemic lupus erythematosus, unspecified: Secondary | ICD-10-CM | POA: Diagnosis not present

## 2018-03-15 NOTE — Unmapped (Signed)
Your echo was normal.    Please let us know if you have any questions.    Dr. Sullivan Lone

## 2018-03-22 NOTE — Unmapped (Signed)
[facility] Specialty Pharmacy Refill Coordination Note  Specialty Medication(s): Benlysta 200mg /ml  Additional Medications shipped: none    Tonya Boyer, DOB: 27-Oct-1965  Phone: [phone] (home) , Alternate phone contact: N/A  Phone or address changes today?: No  All above HIPAA information was verified with patient.  Shipping Address: [address]  [place] [address]   Insurance changes? No    Completed refill call assessment today to schedule patient's medication shipment from the [facility] [phone]).      Confirmed the medication and dosage are correct and have not changed: Yes, regimen is correct and unchanged.    Confirmed patient started or stopped the following medications in the past month:  No, there are no changes reported at this time.    Are you tolerating your medication?:  Tonya Boyer reports tolerating the medication.    ADHERENCE    (Below is required for Medicare Part B or Transplant patients only - per drug):   How many injections were dispensed last month: 4 injections  Patient currently has 2 remaining.    Did you miss any doses in the past 4 weeks? No missed doses reported.    FINANCIAL/SHIPPING    Delivery Scheduled: Yes, Expected medication delivery date: 03/29/18     Medication will be delivered via UPS to the prescription address in [facility].    The patient will receive a drug information handout for each medication shipped and additional FDA Medication Guides as required.      Tonya Boyer did not have any additional questions at this time.    We will follow up with patient monthly for standard refill processing and delivery.      Thank you,  Tera Helper   [facility] Specialty Pharmacist

## 2018-03-28 NOTE — Unmapped (Signed)
Tonya Boyer 's BENLYSTA shipment will be delayed due to PT IN [place]ACE PERIOD We have contacted the patient and left a message We will call the patient to reschedule the delivery upon resolution. We have confirmed the delivery date as NA .

## 2018-03-29 MED FILL — BENLYSTA 200 MG/ML SUBCUTANEOUS SYRINGE: 28 days supply | Qty: 4 | Fill #2 | Status: AC

## 2018-03-29 MED FILL — BENLYSTA 200 MG/ML SUBCUTANEOUS SYRINGE: 28 days supply | Qty: 4 | Fill #2

## 2018-04-16 NOTE — Unmapped (Signed)
[facility] Specialty Pharmacy Clinical Assessment Telephone Call   Medication: Eston Mould    Unable to reach patient to complete required clinical assessment for Benlysta filled through the [facility].     First attempt to reach patient (04/16/18). # has calling restriction that prevented the completion of the call . Unable to leave message.     Jeneen Montgomery

## 2018-04-18 MED ORDER — HYDROXYCHLOROQUINE 200 MG TABLET
ORAL_TABLET | Freq: Every day | ORAL | 3 refills | 0.00000 days | Status: CP
Start: 2018-04-18 — End: 2018-08-29

## 2018-04-18 NOTE — Unmapped (Signed)
[facility] Specialty Pharmacy: Rheumatology Clinic Assessment and Refill Call    Specialty Medication(s): Benlysta  Indication(s): SLE     Tonya Boyer, DOB: [birth year]  Above HIPAA information was verified with patient.      Medications reviewed & verified: Allergies - Medications -      Specialty medication(s) & dose(s) confirmed: yes  Changes to medications: no  Tolerating medications:   Adverse Effects    *All other systems reviewed and are negative        Amount of medications patient has on hand:  2 syringes, next due Friday (2/7)    CLINICAL ASSESSMENT     Tonya Boyer reports tolerating Benlysta well without adverse effects.  Adherence to therapy confirmed with patient and refill record @ [facility].  Patient reports zero missed dose(s) of Benlysta the past few month(s).  Clinically, SLE is stable.  Dr. Sullivan Lone has been trying to convince patietn to started on hydroxychloroquine but patient is hesitant.  Reviewed benefits of medications with patient today along with side effect profile and monitoring parameter.  Patientw as most worried about her G6PD deficiency status and risk with hydroxychloroquine use.  Advised patient to start therapy and we will monitor as neede but the risk is very low for hemolytic anemia.  She agreed to start therapy.  Will get baseline eye exam soon.  Script sent to [facility] as requested. As therapy is effective, appropriate to continue at this time.      Does Tonya Boyer have follow up appointment scheduled with clinic? Yes, appointment is scheduled and patient is aware    SHIPPING     ?? Shipping address verified with patient and Epic/WAM.  Expected medication delivery date: 04/27/18, via UPS.  Next dose of  from this shipment due on 05/04/18.  ?? Other medications/items to be shipping:  n/a    The patient will receive a print out for each medication shipped and additional FDA Medication Guides as required.  Patient education from [place] or Robet Leu may also be included in the shipment    All questions were answered and contact information provided for any future questions/concerns.      Tonya Boyer

## 2018-04-26 MED FILL — EMPTY CONTAINER: 90 days supply | Qty: 1 | Fill #0 | Status: AC

## 2018-04-26 MED FILL — BENLYSTA 200 MG/ML SUBCUTANEOUS SYRINGE: 28 days supply | Qty: 4 | Fill #3 | Status: AC

## 2018-04-26 MED FILL — BENLYSTA 200 MG/ML SUBCUTANEOUS SYRINGE: 28 days supply | Qty: 4 | Fill #3

## 2018-04-26 MED FILL — EMPTY CONTAINER: 90 days supply | Qty: 1 | Fill #0

## 2018-05-01 NOTE — Unmapped (Signed)
[facility] RHEUMATOLOGY CLINIC - PHARMACIST NOTES    Received voicemail from patient stating that she is going on disability and will be changing to Medicare soon.  She will not be eligible for the [facility] copay card and would like alternatives moving forward.      Reached out to patient to discuss further - she is not available.  Left VM requesting call back.  [employer] for SLE or mfg assistance for [place] are all options if copay is high after changing to Medicare.      Chelsea Aus

## 2018-05-01 NOTE — Unmapped (Signed)
[facility] RHEUMATOLOGY CLINIC - PHARMACIST NOTES    Patient returned phone call.  Medicare will start March 1,2020.  She has 1 month of Benlysta left for use.  Advised patient to contact the [facility] (or myself if I'm not on FMLA yet) as soon as she know her new prescription coverage information for benefit investigation.      The SLE [employer] is currently open and can assist with copay for SLE medications, including Benlysta.  Patient has Denice Castell's phone number and will contact Denice for assistance with enrollment.  Per patient's income, should qualify for [employer].  If all fail, will apply to mfg assistance for Benlysta.     Patient verbalized understanding.     Chelsea Aus

## 2018-05-15 DIAGNOSIS — M329 Systemic lupus erythematosus, unspecified: Principal | ICD-10-CM

## 2018-05-16 NOTE — Unmapped (Signed)
[facility] Refill Coordination Note    Specialty Medication(s) to be Shipped:   Inflammatory Disorders: Benlysta    Other medication(s) to be shipped: N/A     Tonya Boyer, DOB: 11-18-1965  Phone: [phone] (home)       All above HIPAA information was verified with patient.     Completed refill call assessment today to schedule patient's medication shipment from the [facility] [phone]).       Specialty medication(s) and dose(s) confirmed: Regimen is correct and unchanged.   Changes to medications: [name] reports no changes reported at this time.  Changes to insurance: No  Questions for the pharmacist: No    Confirmed patient received Welcome Packet with first shipment. The patient will receive a drug information handout for each medication shipped and additional FDA Medication Guides as required.       DISEASE/MEDICATION-SPECIFIC INFORMATION        For patients on injectable medications: Patient currently has 2 doses left.  Next injection is scheduled for 05/18/18.    SPECIALTY MEDICATION ADHERENCE     Medication Adherence    Patient reported X missed doses in the last month:  0  Specialty Medication:  Benlysta 200mg /ml  Patient is on additional specialty medications:  No  Patient is on more than two specialty medications:  No  Any gaps in refill history greater than 2 weeks in the last 3 months:  no  Demonstrates understanding of importance of adherence:  yes  Informant:  patient              Benlysta 200mg /ml : Patient has 14 days of medication on hand       SHIPPING     Shipping address confirmed in Epic.     Delivery Scheduled: Yes, Expected medication delivery date: 05/24/18.     Medication will be delivered via UPS to the prescription address in Epic [place].    [name]   [facility] Specialty Technician

## 2018-05-23 MED FILL — BENLYSTA 200 MG/ML SUBCUTANEOUS SYRINGE: 28 days supply | Qty: 4 | Fill #4

## 2018-05-23 MED FILL — BENLYSTA 200 MG/ML SUBCUTANEOUS SYRINGE: 28 days supply | Qty: 4 | Fill #4 | Status: AC

## 2018-06-14 MED ORDER — EMPTY CONTAINER
2 refills | 0 days
Start: 2018-06-14 — End: ?

## 2018-06-14 MED ORDER — BELIMUMAB 200 MG/ML SUBCUTANEOUS SYRINGE
3 refills | 0 days | Status: CP
Start: 2018-06-14 — End: 2019-06-14
  Filled 2018-06-19: qty 4, 28d supply, fill #0

## 2018-06-14 NOTE — Unmapped (Signed)
Benlysta refill  Last ov: 12/14/2017  Next ov: 09/18/2018   Labs:   AST   Date Value Ref Range Status   12/14/2017 29 17 - 47 U/L Final   11/05/2014 26 0 - 40 IU/L Final     ALT   Date Value Ref Range Status   12/14/2017 24 13 - 69 U/L Final   11/05/2014 20 0 - 32 IU/L Final     Creatinine   Date Value Ref Range Status   12/14/2017 0.83 0.60 - 1.00 mg/dL Final   16/12/9602 5.40 0.57 - 1.00 mg/dL Final     WBC   Date Value Ref Range Status   12/14/2017 3.3 (L) 4.5 - 11.0 10*9/L Final   11/05/2014 2.4 (LL) 3.4 - 10.8 x10E3/uL Final     HGB   Date Value Ref Range Status   12/14/2017 12.4 12.0 - 16.0 g/dL Final   98/01/9146 82.9 11.1 - 15.9 g/dL Final     HCT   Date Value Ref Range Status   12/14/2017 39.0 36.0 - 46.0 % Final   11/05/2014 34.0 34.0 - 46.6 % Final     MCV   Date Value Ref Range Status   12/14/2017 95.0 80.0 - 100.0 fL Final   11/05/2014 94 79 - 97 fL Final     RDW   Date Value Ref Range Status   12/14/2017 12.8 12.0 - 15.0 % Final   11/05/2014 13.7 12.3 - 15.4 % Final     Platelet   Date Value Ref Range Status   12/14/2017 280 150 - 440 10*9/L Final   11/05/2014 241 150 - 379 x10E3/uL Final     Neutrophils %   Date Value Ref Range Status   12/14/2017 58.3 % Final     Lymphocytes %   Date Value Ref Range Status   12/14/2017 24.0 % Final   11/05/2014 41 % Final     Monocytes %   Date Value Ref Range Status   12/14/2017 9.6 % Final   11/05/2014 16 % Final     Eosinophils %   Date Value Ref Range Status   12/14/2017 2.4 % Final   11/05/2014 1 % Final     Basophils %   Date Value Ref Range Status   12/14/2017 0.6 % Final   11/05/2014 0 % Final

## 2018-06-14 NOTE — Unmapped (Signed)
[facility] Pharm[facility] Refill Coordination Note    Specialty Medication(s) to be Shipped:   Inflammatory Disorders: Benlysta    Other medication(s) to be shipped: n/a     Tonya Boyer, DOB: 07/06/1965  Phone: [phone] (home)       All above HIPAA information was verified with patient.     Completed refill call assessment today to schedule patient's medication shipment from the [facility] [phone]).       Specialty medication(s) and dose(s) confirmed: Regimen is correct and unchanged.   Changes to medications: [name] reports no changes reported at this time.  Changes to insurance: No  Questions for the pharmacist: No    Confirmed patient received Welcome Packet with first shipment. The patient will receive a drug information handout for each medication shipped and additional FDA Medication Guides as required.       DISEASE/MEDICATION-SPECIFIC INFORMATION        For patients on injectable medications: Patient currently has 2 doses left.  Next injection is scheduled for 06/15/18.    SPECIALTY MEDICATION ADHERENCE     Medication Adherence    Patient reported X missed doses in the last month:  0  Specialty Medication:  Benlysta 200mg /ml  Patient is on additional specialty medications:  No  Patient is on more than two specialty medications:  No  Any gaps in refill history greater than 2 weeks in the last 3 months:  no  Demonstrates understanding of importance of adherence:  yes  Informant:  patient                Benlysta 200mg /ml: Patient has 14 days of medication on hand       SHIPPING     Shipping address confirmed in Epic.     Delivery Scheduled: Yes, Expected medication delivery date: 06/20/18.     Medication will be delivered via UPS to the home address in Epic WAM.    Tonya Boyer   [facility] Specialty Technician

## 2018-06-19 MED FILL — BENLYSTA 200 MG/ML SUBCUTANEOUS SYRINGE: 28 days supply | Qty: 4 | Fill #0 | Status: AC

## 2018-07-16 NOTE — Unmapped (Signed)
[facility] Pharmacy Refill Coordination Note    Specialty Medication(s) to be Shipped:   Inflammatory Disorders: Benlysta    Other medication(s) to be shipped:       Tonya Boyer, DOB: [birth year]  Phone: [phone] (home)       All above HIPAA information was verified with patient.     Completed refill call assessment today to schedule patient's medication shipment from the [facility] [phone]).       Specialty medication(s) and dose(s) confirmed: Regimen is correct and unchanged.   Changes to medications: Aleathia reports no changes at this time.  Changes to insurance: No  Questions for the pharmacist: No    Confirmed patient received Welcome Packet with first shipment. The patient will receive a drug information handout for each medication shipped and additional FDA Medication Guides as required.       DISEASE/MEDICATION-SPECIFIC INFORMATION        N/A    SPECIALTY MEDICATION ADHERENCE     Medication Adherence    Patient reported X missed doses in the last month:  0  Specialty Medication:  Benlysta 200mg /ml                Benlysta 200 mg/ml: 7 days of medicine on hand       SHIPPING     Shipping address confirmed in Epic.     Delivery Scheduled: Yes, Expected medication delivery date: 05/08.     Medication will be delivered via UPS to the home address in Epic WAM.    Tonya Boyer   [facility] Specialty Technician

## 2018-07-19 MED FILL — BENLYSTA 200 MG/ML SUBCUTANEOUS SYRINGE: 28 days supply | Qty: 4 | Fill #1 | Status: AC

## 2018-07-19 MED FILL — BENLYSTA 200 MG/ML SUBCUTANEOUS SYRINGE: 28 days supply | Qty: 4 | Fill #1

## 2018-08-01 DIAGNOSIS — G518 Other disorders of facial nerve: Secondary | ICD-10-CM | POA: Diagnosis not present

## 2018-08-01 DIAGNOSIS — M542 Cervicalgia: Secondary | ICD-10-CM | POA: Diagnosis not present

## 2018-08-01 DIAGNOSIS — G43719 Chronic migraine without aura, intractable, without status migrainosus: Secondary | ICD-10-CM | POA: Diagnosis not present

## 2018-08-01 DIAGNOSIS — M791 Myalgia, unspecified site: Secondary | ICD-10-CM | POA: Diagnosis not present

## 2018-08-13 NOTE — Unmapped (Signed)
[facility] Pharmacy Refill Coordination Note    Specialty Medication(s) to be Shipped:   Inflammatory Disorders: Benlysta 200mg /ml     Tonya Boyer, DOB: 06-Apr-1965  Phone: [phone] (home)      All above HIPAA information was verified with patient.     Completed refill call assessment today to schedule patient's medication shipment from the [facility] [phone]).       Specialty medication(s) and dose(s) confirmed: Regimen is correct and unchanged.   Changes to medications: Tonya Boyer reports no changes reported at this time.  Changes to insurance: No  Questions for the pharmacist: No    Confirmed patient received Welcome Packet with first shipment. The patient will receive a drug information handout for each medication shipped and additional FDA Medication Guides as required.       DISEASE/MEDICATION-SPECIFIC INFORMATION        For patients on injectable medications: Patient currently has 1 doses left.  Next injection is scheduled for 08/17/2018.    SPECIALTY MEDICATION ADHERENCE     Medication Adherence    Patient reported X missed doses in the last month:  0  Specialty Medication:  Benlysta 200mg /ml  Patient is on additional specialty medications:  No          SHIPPING     Shipping address confirmed in Epic.     Delivery Scheduled: Yes, Expected medication delivery date: 08/21/2018.     Medication will be delivered via UPS to the home address in Epic [place].    Tonya Boyer P Tonya Boyer   [facility] Specialty Technician

## 2018-08-17 DIAGNOSIS — I10 Essential (primary) hypertension: Secondary | ICD-10-CM | POA: Diagnosis not present

## 2018-08-17 DIAGNOSIS — M797 Fibromyalgia: Secondary | ICD-10-CM | POA: Diagnosis not present

## 2018-08-17 DIAGNOSIS — R7309 Other abnormal glucose: Secondary | ICD-10-CM | POA: Diagnosis not present

## 2018-08-17 DIAGNOSIS — E7849 Other hyperlipidemia: Secondary | ICD-10-CM | POA: Diagnosis not present

## 2018-08-17 DIAGNOSIS — M329 Systemic lupus erythematosus, unspecified: Secondary | ICD-10-CM | POA: Diagnosis not present

## 2018-08-17 DIAGNOSIS — Z0001 Encounter for general adult medical examination with abnormal findings: Secondary | ICD-10-CM | POA: Diagnosis not present

## 2018-08-17 DIAGNOSIS — Z6826 Body mass index (BMI) 26.0-26.9, adult: Secondary | ICD-10-CM | POA: Diagnosis not present

## 2018-08-17 DIAGNOSIS — E663 Overweight: Secondary | ICD-10-CM | POA: Diagnosis not present

## 2018-08-17 DIAGNOSIS — Z1389 Encounter for screening for other disorder: Secondary | ICD-10-CM | POA: Diagnosis not present

## 2018-08-17 DIAGNOSIS — E559 Vitamin D deficiency, unspecified: Secondary | ICD-10-CM | POA: Diagnosis not present

## 2018-08-17 DIAGNOSIS — G43909 Migraine, unspecified, not intractable, without status migrainosus: Secondary | ICD-10-CM | POA: Diagnosis not present

## 2018-08-20 MED FILL — BENLYSTA 200 MG/ML SUBCUTANEOUS SYRINGE: 28 days supply | Qty: 4 | Fill #2 | Status: AC

## 2018-08-20 MED FILL — BENLYSTA 200 MG/ML SUBCUTANEOUS SYRINGE: 28 days supply | Qty: 4 | Fill #2

## 2018-08-29 MED ORDER — HYDROXYCHLOROQUINE 200 MG TABLET
ORAL_TABLET | 0 refills | 0 days | Status: CP
Start: 2018-08-29 — End: 2018-09-18

## 2018-08-29 NOTE — Unmapped (Signed)
Sent pt MyChart message regarding Plaquenil Eye Exam.

## 2018-08-29 NOTE — Unmapped (Signed)
Hydroxychloroquine refill  Last ov: Visit date not found  Next ov: 09/18/2018

## 2018-08-30 DIAGNOSIS — G518 Other disorders of facial nerve: Secondary | ICD-10-CM | POA: Diagnosis not present

## 2018-08-30 DIAGNOSIS — M791 Myalgia, unspecified site: Secondary | ICD-10-CM | POA: Diagnosis not present

## 2018-08-30 DIAGNOSIS — G43719 Chronic migraine without aura, intractable, without status migrainosus: Secondary | ICD-10-CM | POA: Diagnosis not present

## 2018-08-30 DIAGNOSIS — M542 Cervicalgia: Secondary | ICD-10-CM | POA: Diagnosis not present

## 2018-09-06 NOTE — Unmapped (Signed)
Spoke with pt. She would like visit 09/18/2018 changed to video visit, if Dr. Sullivan Lone is agreeable. Pt states any labs Dr. Sullivan Lone would like to order can be faxed to her PCP listed in Epic(Dr. Phillips Odor), and she will have labs compelted locally. Pt states she thought Dr. Sullivan Lone had mentioned a CXR. Pt will send MyChart message with information on where CXR order can be sent locally. Sent message to Dr. Sullivan Lone regarding.

## 2018-09-06 NOTE — Unmapped (Signed)
Referral order and notes faxed to [facility] as requested. Please see routing history in Epic for details.

## 2018-09-13 NOTE — Unmapped (Addendum)
[facility] Special[facility] Pharmacy Refill Coordination Note    Specialty Medication(s) to be Shipped:   Inflammatory Disorders: Benlysta    Other medication(s) to be shipped: none     Tonya Boyer, DOB: 1965/12/31  Phone: [phone] (home)       All above HIPAA information was verified with patient.     Completed refill call assessment today to schedule patient's medication shipment from the [facility] [phone]).       Specialty medication(s) and dose(s) confirmed: Regimen is correct and unchanged.   Changes to medications: Codi reports no changes at this time.  Changes to insurance: No  Questions for the pharmacist: No    Confirmed patient received Welcome Packet with first shipment. The patient will receive a drug information handout for each medication shipped and additional FDA Medication Guides as required.       DISEASE/MEDICATION-SPECIFIC INFORMATION        N/A    SPECIALTY MEDICATION ADHERENCE     Medication Adherence    Specialty Medication:  Benlysta            BENLYSTA: 0 doses on hand.       SHIPPING     Shipping address confirmed in Epic.     Delivery Scheduled: Yes, Expected medication delivery date: 09/17/18.     Medication will be delivered via Same Day Courier to the home address in Epic WAM.    [place] A Zehava Turski   [facility] Specialty Technician

## 2018-09-17 MED FILL — BENLYSTA 200 MG/ML SUBCUTANEOUS SYRINGE: 28 days supply | Qty: 4 | Fill #3 | Status: AC

## 2018-09-17 MED FILL — BENLYSTA 200 MG/ML SUBCUTANEOUS SYRINGE: 28 days supply | Qty: 4 | Fill #3

## 2018-09-17 NOTE — Unmapped (Signed)
Pt requesting ophthalmology referral be sent to [facility], not Dr. Wynona Neat as previously requested. Please update referral order and I will fax.    Thank you!  Cherron Blitzer

## 2018-09-18 ENCOUNTER — Telehealth: Admit: 2018-09-18 | Discharge: 2018-09-19 | Payer: MEDICARE

## 2018-09-18 DIAGNOSIS — Z79899 Other long term (current) drug therapy: Secondary | ICD-10-CM | POA: Diagnosis not present

## 2018-09-18 DIAGNOSIS — R42 Dizziness and giddiness: Secondary | ICD-10-CM | POA: Diagnosis not present

## 2018-09-18 DIAGNOSIS — R091 Pleurisy: Secondary | ICD-10-CM | POA: Diagnosis not present

## 2018-09-18 DIAGNOSIS — E559 Vitamin D deficiency, unspecified: Secondary | ICD-10-CM | POA: Diagnosis not present

## 2018-09-18 DIAGNOSIS — M797 Fibromyalgia: Secondary | ICD-10-CM | POA: Diagnosis not present

## 2018-09-18 DIAGNOSIS — M329 Systemic lupus erythematosus, unspecified: Secondary | ICD-10-CM | POA: Diagnosis not present

## 2018-09-18 MED ORDER — HYDROXYCHLOROQUINE 200 MG TABLET
ORAL_TABLET | Freq: Every day | ORAL | 3 refills | 0 days | Status: CP
Start: 2018-09-18 — End: ?

## 2018-09-18 NOTE — Unmapped (Signed)
Rheumatology Remote Clinic Follow-up Appointment     Patient self identified using name and date of birth.    Patient location: Home.     Last clinic visit: Oct 2019     Reason for encounter: Follow-up systemic lupus erythematosus (SLE)     HISTORY: Ms. Dearcos is a 53 y.o. female with a history of SLE (dx 2014) characerized by fatigue, leukopenia, arthralgias, pleurisy, reactive LAD, fatigue, brain fog, +ANA, +dsDNA, +Ro, +RNP, low titer aCL IgM who presents for follow-up visit of SLE. In brief summary, at time of diagnosis in 2014 the patient was started on MTX [place] with significant improvement in arthralgia, brain fog, fatigue but DC'd in 04/2014 2/2 mild elevation in LFTs and leukopenia (ANC <1), leflunomide then tried without improvement in joint pain. G6PD markedly low, thus plaquenil not started due to concern for hemolytic anemia. Has not been able to tolerate steroids (even low dose) 2/2 anxiety. Trial of azathioprine started in 06/2014 with close monitoring of counts 2/2 low normal TPMT but DC'd 2/2 worsening leukopenia (WBC 2.4, ANC 1) in beginning of 11/2014. In 12/2014, the patient was started on belimumab. Of note, the patient has had persistently enlarged LAD - work up including PET CT with hypermetabolic LAD in neck, chest, pelvis. SLE vs. Lymphoma. Had bx 10/2014 by ENT of L neck node - reactive hyperplasia, no evidence of malignancy. Started plaquenil early 2020.  ??  In 07/2015, the patient was seen by Dr. Janee Morn for neurocognitive testing which revealed mild impairment consistent with a patient with SLE and was felt that fatigue was playing a role in her reduced cognitive efficiency. Recommended going to counseling as well due to adjustment to disability and depression/anxiety. TTE 07/2015 also normal. Sent for MRI brain/MRA head and neck which were normal 10/2015.   ??  Interval History:  - Remains on benlysta SQ every 7 days.   - Started plaquenil a few months ago, feels like has some rapid heart beat with this, also had some anxiety when first starting. Feels like when lupus gets off feels she has some anxiety.   - She gets decadron shots from neurology for past several years from Dr. Anise Salvo ([facility] in [place], [place]).   - Continues to have chest pain and joint pain. Also having back and side pain.    - Has had some episodes of feeling slightly weak in arms, legs, and neck. Happened once or twice past couple weeks, lasts hours to a day. OK standing up from chair but feels tired when moving around, feels like more effort than normal..   - Has not yet seen pulm, planning to go to [place] not yet scheduled.   - Had some rash on face, improved with desonide.     Objective    Physical Exam:  Pt reports BP 133/76.   GENERAL: The patient is well appearing, in no acute distress.   SKIN: No rashes.   EYES: EOMI. Sclera anicteric, conjunctiva non-injected.   ENT: mucus membranes moist.   Neck: supple.   Respiratory: Breathing non-labored. No cough.   CV: Hands appear well perfused  GI: Abdomen nontender when patient asked to press on it.   VASCULAR: No c/c/e bilateral hands.  NEURO: CN 2-12 grossly intact.  Able to rise from chair with arms crossed.  PSYCH: No depression or anxiety. Cooperative. Alert and oriented.   MUSCULOSKELETAL:   ?? Bilateral shoulders, elbows, wrists, hands, fingers:  No deformity. No TTP when patient presses on  MCPs, PIPs, DIPS, wrists, or elbows. No swelling or limited ROM.?? Able to curl all fingers. Prayer sign negative.   ?? Bilateral knees, ankles, feet, toes: No TTP when patient presses on knees, ankles, and MTPs.             ASSESSMENT/PLAN:  Ms. Vanecek is a 53 y.o. female with a history of SLE (dx 2014) characerized by fatigue, leukopenia, arthralgia, pleurisy, reactive LAD, brain fog, +ANA, +dsDNA, +Ro, +RNP, low titer aCL IgM currently on belimumab SQ weekly who presents for follow up.  ??  SLE: currently on belimumab SQ and plaquenil 200 mg daily with overall stability in her symptoms including improvement in pleurisy, arthralgia, brain fog. No evidence of inflammatory arthritis or rash today on limited video exam or per pt report.    - Reviewed labs from last month from PCP. Did not include complements, dsDNA, or urine.  - Will check lupus monitoring labs in 2 months with repeat CBC and CMP.  - Continue benlysta SQ weekly injection  - Continue plaquenil 200 mg daily.     High risk medication monitoring: Benlysta  - Patient is currently taking Benlysta, an immunosuppressant medication that requires intensive monitoring. This monitoring was done today through history, physical, and/or lab testing.   - No recent infections or hospitalizations.   - Will check CBC and CMP today.     Long-term plaquenil use: Patient is currently taking plaquenil which requires intensive monitoring including regular eye exams due to risk for retinal toxicity.    - Plaquenil started: 2020  - Last eye exam was: n/a    - Patient has the following risk factors for retinal toxicity from plaquenil: None  - Referral to ophtho for baseline plaquenil eye exam.           Dizziness and HA/brain fog/anxiety: MRI brain and MRA head and neck normal Aug 2017. Neurocognitive testing in 07/2015 with mild impairment but likely multifactorial including SLE, fatigue, anxiety/depression. May be exacerbated by poor sleep.   - Pt declines referral to sleep medicine (neuro) again today.   ??  Fibromalgia: Not discussed today.   ??  Intermittent pleurisy/PFTs with isolated low DLCO: Pleurisy likely due to SLE now resolved, TTE in 2015 with diastolic dysfunction, mitral valve disease, mild dilated LA and thoracic aorta. Seen by cardiology in 02/2015 who recommended f/u TTE every 2-3 years. PFTs normal spirometry but low DLCO. TTE Dec 2019 was normal  --TTE every 2-3 years  ??  Vitamin D deficiency: cont daily supplementation.   ??  Immunization Counseling:   - Flu vaccine: Sept 2019  - Prevnar PCV-13: Today Oct 2019  - Pneumovax PPSV-23: Sept 2016  ??  Follow-up: 8-9 months    Danella Maiers, MD, [facility]  Assistant Professor of Medicine  Department of Medicine/Division of Rheumatology  [facility] [place] [facility]  [phone] clinic phone  [phone] clinic secure fax       I spent 22 minutes on the audio/video with the patient. I spent an additional 10 minutes on pre- and post-visit activities.     The patient was physically located in [place] or a state in which I am permitted to provide care. The patient and/or parent/guardian understood that s/he may incur co-pays and cost sharing, and agreed to the telemedicine visit. The visit was reasonable and appropriate under the circumstances given the patient's presentation at the time.    The patient and/or parent/guardian has been advised of the potential risks  and limitations of this mode of treatment (including, but not limited to, the absence of in-person examination) and has agreed to be treated using telemedicine. The patient's/patient's family's questions regarding telemedicine have been answered.     If the visit was completed in an ambulatory setting, the patient and/or parent/guardian has also been advised to contact their provider???s office for worsening conditions, and seek emergency medical treatment and/or call 911 if the patient deems either necessary.

## 2018-09-20 NOTE — Unmapped (Signed)
Referral and last visit note faxed to [facility]. Please see routing history in Epic for details. Pt notified via MyChart message.

## 2018-09-21 ENCOUNTER — Telehealth: Payer: Self-pay | Admitting: Hematology and Oncology

## 2018-09-21 NOTE — Telephone Encounter (Signed)
Called pt per 7/10 sch message - unable to reach pt . Left message with appt date and time

## 2018-09-25 ENCOUNTER — Telehealth: Payer: Self-pay | Admitting: *Deleted

## 2018-09-25 ENCOUNTER — Inpatient Hospital Stay: Payer: Medicare PPO | Attending: Family Medicine

## 2018-09-25 ENCOUNTER — Inpatient Hospital Stay: Payer: Medicare PPO | Admitting: Hematology and Oncology

## 2018-09-25 ENCOUNTER — Other Ambulatory Visit: Payer: Self-pay | Admitting: Hematology and Oncology

## 2018-09-25 ENCOUNTER — Encounter: Payer: Self-pay | Admitting: Hematology and Oncology

## 2018-09-25 DIAGNOSIS — M329 Systemic lupus erythematosus, unspecified: Secondary | ICD-10-CM

## 2018-09-25 NOTE — Telephone Encounter (Signed)
No Show letter sent to patient.  

## 2018-09-27 LAB — COMPREHENSIVE METABOLIC PANEL
A/G RATIO: 1.7 (ref 1.2–2.2)
ALBUMIN: 4.5 g/dL (ref 3.8–4.9)
ALKALINE PHOSPHATASE: 146 IU/L — ABNORMAL HIGH (ref 39–117)
ALT (SGPT): 14 IU/L (ref 0–32)
AST (SGOT): 18 IU/L (ref 0–40)
BILIRUBIN TOTAL: 0.5 mg/dL (ref 0.0–1.2)
BLOOD UREA NITROGEN: 14 mg/dL (ref 6–24)
BUN / CREAT RATIO: 15 (ref 9–23)
CALCIUM: 9.7 mg/dL (ref 8.7–10.2)
CHLORIDE: 106 mmol/L (ref 96–106)
CO2: 26 mmol/L (ref 20–29)
CREATININE: 0.91 mg/dL (ref 0.57–1.00)
GFR MDRD AF AMER: 84 mL/min/{1.73_m2}
GLOBULIN, TOTAL: 2.7 g/dL (ref 1.5–4.5)
GLUCOSE: 75 mg/dL (ref 65–99)
POTASSIUM: 4 mmol/L (ref 3.5–5.2)
TOTAL PROTEIN: 7.2 g/dL (ref 6.0–8.5)

## 2018-09-27 LAB — MICROSCOPIC EXAMINATION
BACTERIA: NONE SEEN
CASTS: NONE SEEN /LPF

## 2018-09-27 LAB — URINALYSIS WITH CULTURE REFLEX
BILIRUBIN UA: NEGATIVE
BLOOD UA: NEGATIVE
GLUCOSE UA: NEGATIVE
KETONES UA: NEGATIVE
NITRITE UA: NEGATIVE
PH UA: 6 (ref 5.0–7.5)
PROTEIN UA: NEGATIVE
UROBILINOGEN UA: 0.2 mg/dL (ref 0.2–1.0)

## 2018-09-27 LAB — POTASSIUM: Potassium:SCnc:Pt:Ser/Plas:Qn:: 4

## 2018-09-27 LAB — CBC W/ DIFFERENTIAL
BANDED NEUTROPHILS ABSOLUTE COUNT: 0 10*3/uL (ref 0.0–0.1)
BASOPHILS ABSOLUTE COUNT: 0 10*3/uL (ref 0.0–0.2)
BASOPHILS RELATIVE PERCENT: 1 %
EOSINOPHILS ABSOLUTE COUNT: 0 10*3/uL (ref 0.0–0.4)
EOSINOPHILS RELATIVE PERCENT: 2 %
HEMOGLOBIN: 12.2 g/dL (ref 11.1–15.9)
IMMATURE GRANULOCYTES: 0 %
LYMPHOCYTES ABSOLUTE COUNT: 0.9 10*3/uL (ref 0.7–3.1)
LYMPHOCYTES RELATIVE PERCENT: 36 %
MEAN CORPUSCULAR HEMOGLOBIN CONC: 31.9 g/dL (ref 31.5–35.7)
MEAN CORPUSCULAR HEMOGLOBIN: 31 pg (ref 26.6–33.0)
MEAN CORPUSCULAR VOLUME: 97 fL (ref 79–97)
MONOCYTES ABSOLUTE COUNT: 0.3 10*3/uL (ref 0.1–0.9)
NEUTROPHILS ABSOLUTE COUNT: 1.3 10*3/uL — ABNORMAL LOW (ref 1.4–7.0)
NEUTROPHILS RELATIVE PERCENT: 48 %
PLATELET COUNT: 304 10*3/uL (ref 150–450)
RED BLOOD CELL COUNT: 3.94 x10E6/uL (ref 3.77–5.28)
RED CELL DISTRIBUTION WIDTH: 12.1 % (ref 11.7–15.4)

## 2018-09-27 LAB — PROTEIN UA: Protein:PrThr:Pt:Urine:Ord:Test strip: NEGATIVE

## 2018-09-27 LAB — DSDNA ANTIBODY: DNA double strand Ab:ACnc:Pt:Ser:Qn:: 2

## 2018-09-27 LAB — PROTEIN URINE: Protein:MCnc:Pt:Urine:Qn:: 9.9

## 2018-09-27 LAB — PROTEIN / CREATININE RATIO, URINE
PROTEIN URINE: 9.9 mg/dL
PROTEIN/CREAT RATIO: 90 mg/g{creat} (ref 0–200)

## 2018-09-27 LAB — CREATINE KINASE TOTAL: Creatine kinase:CCnc:Pt:Ser/Plas:Qn:: 144

## 2018-09-27 LAB — NEUTROPHILS RELATIVE PERCENT: Neutrophils/100 leukocytes:NFr:Pt:Bld:Qn:Automated count: 48

## 2018-09-27 LAB — COMPLEMENT C3, SERUM: Complement C3:MCnc:Pt:Ser/Plas:Qn:: 105

## 2018-09-27 LAB — WBC URINE

## 2018-09-27 LAB — COMPLEMENT C4, SERUM: Complement C4:MCnc:Pt:Ser/Plas:Qn:: 21

## 2018-10-01 NOTE — Unmapped (Signed)
Results from your recent lab tests were normal or without significant abnormalities.     Please let us know if you have any questions.    Dr. Sullivan Lone

## 2018-10-09 NOTE — Unmapped (Signed)
[facility] Pharmacy Clinical Assessment & Refill Coordination Note    Tonya Boyer, DOB: 08-13-1965  Phone: [phone] (home)     All above HIPAA information was verified with patient.     Specialty Medication(s):   Inflammatory Disorders: Benlysta     Current Outpatient Medications   Medication Sig Dispense Refill   ??? belimumab 200 mg/mL Syrg INJECT THE CONTENTS OF 1 SYRINGE (200MG ) UNDER THE SKIN EVERY 7 DAYS 12 mL 3   ??? clotrimazole (LOTRIMIN) 1 % cream Apply topically Two (2) times a day.     ??? desonide (DESOWEN) 0.05 % cream Apply topically Two (2) times a day. (Patient not taking: Reported on 09/18/2018) 60 g 0   ??? dexamethasone (DECADRON) 10 mg/mL injection Infuse into a venous catheter. Dose unknown, every 6-8 weeks. Injections. Given with lidocaine and marcaine.     ??? DULoxetine (CYMBALTA) 20 MG capsule Take 20 mg by mouth daily.  0   ??? empty container Misc use as directed 1 each PRN   ??? hydrOXYchloroQUINE (PLAQUENIL) 200 mg tablet Take 1 tablet (200 mg total) by mouth daily. 90 tablet 3   ??? naproxen sodium (ALEVE) 220 MG tablet Take 220 mg by mouth continuous as needed for pain.     ??? triamcinolone (KENALOG) 0.1 % cream        No current facility-administered medications for this visit.         Changes to medications: [name] reports no changes at this time.    Allergies   Allergen Reactions   ??? Amoxicillin Hives   ??? Penicillins Hives   ??? Latex Other (See Comments)     Skin irritation   ??? Penicillin G    ??? Prednisone    ??? Latex, Natural Rubber Rash       Changes to allergies: No    SPECIALTY MEDICATION ADHERENCE     Benlysta 200 mg/ml: 7 days of medicine on hand     Medication Adherence    Patient reported X missed doses in the last month: 0  Specialty Medication: Benlysta 200mg /ml          Specialty medication(s) dose(s) confirmed: Regimen is correct and unchanged.     Are there any concerns with adherence? No    Adherence counseling provided? Not needed    CLINICAL MANAGEMENT AND INTERVENTION      Clinical Benefit Assessment:    Do you feel the medicine is effective or helping your condition? Yes    Clinical Benefit counseling provided? Not needed    Adverse Effects Assessment:    Are you experiencing any side effects? No    Are you experiencing difficulty administering your medicine? No    Quality of Life Assessment:    How many days over the past month did your lupus keep you from your normal activities? For example, brushing your teeth or getting up in the morning. Patient declined to answer    Have you discussed this with your provider? Not needed    Therapy Appropriateness:    Is therapy appropriate? Yes, therapy is appropriate and should be continued    DISEASE/MEDICATION-SPECIFIC INFORMATION      For patients on injectable medications: Patient currently has 1 doses left.  Next injection is scheduled for 10/12/2018.    PATIENT SPECIFIC NEEDS     ? Does the patient have any physical, cognitive, or cultural barriers? No    ? Is the patient high risk? No     ?  Does the patient require a Care Management Plan? No     ? Does the patient require physician intervention or other additional services (i.e. nutrition, smoking cessation, social work)? No      SHIPPING     Specialty Medication(s) to be Shipped:   Inflammatory Disorders: Benlysta 200mg /ml    Other medication(s) to be shipped: sharps kit       Changes to insurance: No    Delivery Scheduled: Yes, Expected medication delivery date: 10/16/2018.     Medication will be delivered via UPS to the confirmed home address in [facility].    The patient will receive a drug information handout for each medication shipped and additional FDA Medication Guides as required.  Verified that patient has previously received a Conservation officer, historic buildings.    All of the patient's questions and concerns have been addressed.    Karene Fry Joss Mcdill   [facility] [employer] [facility] Specialty Pharmacist

## 2018-10-15 MED FILL — BENLYSTA 200 MG/ML SUBCUTANEOUS SYRINGE: 28 days supply | Qty: 4 | Fill #4 | Status: AC

## 2018-10-15 MED FILL — EMPTY CONTAINER: 90 days supply | Qty: 1 | Fill #1 | Status: AC

## 2018-10-15 MED FILL — BENLYSTA 200 MG/ML SUBCUTANEOUS SYRINGE: 28 days supply | Qty: 4 | Fill #4

## 2018-10-15 MED FILL — EMPTY CONTAINER: 90 days supply | Qty: 1 | Fill #1

## 2018-10-18 DIAGNOSIS — G43719 Chronic migraine without aura, intractable, without status migrainosus: Secondary | ICD-10-CM | POA: Diagnosis not present

## 2018-10-18 DIAGNOSIS — M542 Cervicalgia: Secondary | ICD-10-CM | POA: Diagnosis not present

## 2018-10-18 DIAGNOSIS — M791 Myalgia, unspecified site: Secondary | ICD-10-CM | POA: Diagnosis not present

## 2018-10-18 DIAGNOSIS — G518 Other disorders of facial nerve: Secondary | ICD-10-CM | POA: Diagnosis not present

## 2018-10-25 DIAGNOSIS — H524 Presbyopia: Secondary | ICD-10-CM | POA: Diagnosis not present

## 2018-10-25 DIAGNOSIS — Z79899 Other long term (current) drug therapy: Secondary | ICD-10-CM | POA: Diagnosis not present

## 2018-10-25 DIAGNOSIS — H43393 Other vitreous opacities, bilateral: Secondary | ICD-10-CM | POA: Diagnosis not present

## 2018-10-25 DIAGNOSIS — H2513 Age-related nuclear cataract, bilateral: Secondary | ICD-10-CM | POA: Diagnosis not present

## 2018-10-25 DIAGNOSIS — H40013 Open angle with borderline findings, low risk, bilateral: Secondary | ICD-10-CM | POA: Diagnosis not present

## 2018-11-07 NOTE — Unmapped (Signed)
[facility] Refill Coordination Note    Specialty Medication(s) to be Shipped:   Inflammatory Disorders: Benlysta    Other medication(s) to be shipped: n/a     Tonya Boyer, DOB: 04/14/1965  Phone: [phone] (home)       All above HIPAA information was verified with patient.     Completed refill call assessment today to schedule patient's medication shipment from the [facility] [phone]).       Specialty medication(s) and dose(s) confirmed: Regimen is correct and unchanged.   Changes to medications: Karlye reports no changes at this time.  Changes to insurance: No  Questions for the pharmacist: No    Confirmed patient received Welcome Packet with first shipment. The patient will receive a drug information handout for each medication shipped and additional FDA Medication Guides as required.       DISEASE/MEDICATION-SPECIFIC INFORMATION        For patients on injectable medications: Patient currently has 1 doses left.  Next injection is scheduled for 11/09/18.    SPECIALTY MEDICATION ADHERENCE     Medication Adherence    Patient reported X missed doses in the last month: 0  Specialty Medication: Benlysta  Patient is on additional specialty medications: No  Patient is on more than two specialty medications: No  Any gaps in refill history greater than 2 weeks in the last 3 months: no  Demonstrates understanding of importance of adherence: yes  Informant: patient                Benlysta 200mg /ml: Patient has 7 days of medication on hand      SHIPPING     Shipping address confirmed in Epic.     Delivery Scheduled: Yes, Expected medication delivery date: 11/15/18.     Medication will be delivered via UPS to the prescription address in Epic WAM.    [name]   [facility] Specialty Technician

## 2018-11-15 MED FILL — BENLYSTA 200 MG/ML SUBCUTANEOUS SYRINGE: 28 days supply | Qty: 4 | Fill #5

## 2018-11-15 MED FILL — BENLYSTA 200 MG/ML SUBCUTANEOUS SYRINGE: 28 days supply | Qty: 4 | Fill #5 | Status: AC

## 2018-11-15 NOTE — Unmapped (Addendum)
Tonya Boyer 's Benlysta shipment will be delayed due to Insufficient inventory We have contacted the patient and communicated the delivery change to patient/caregiver We will reschedule the medication for the delivery date that the patient agreed upon. We have confirmed the delivery date as 11/16/2018.

## 2018-12-06 NOTE — Unmapped (Signed)
[facility] Refill Coordination Note    Specialty Medication(s) to be Shipped:   Inflammatory Disorders: Benlysta    Other medication(s) to be shipped: n/a     [name], DOB: Dec 28, 1965  Phone: [phone] (home)       All above HIPAA information was verified with patient.     Completed refill call assessment today to schedule patient's medication shipment from the [facility] [phone]).       Specialty medication(s) and dose(s) confirmed: Regimen is correct and unchanged.   Changes to medications: Kylina reports no changes at this time.  Changes to insurance: No  Questions for the pharmacist: No    Confirmed patient received Welcome Packet with first shipment. The patient will receive a drug information handout for each medication shipped and additional FDA Medication Guides as required.       DISEASE/MEDICATION-SPECIFIC INFORMATION        For patients on injectable medications: Patient currently has 1 doses left.  Next injection is scheduled for 9/25.    SPECIALTY MEDICATION ADHERENCE     Medication Adherence    Patient reported X missed doses in the last month: 0  Specialty Medication: Benlysta                SHIPPING     Shipping address confirmed in Epic.     Delivery Scheduled: Yes, Expected medication delivery date: 9/30.     Medication will be delivered via UPS to the prescription address in Epic WAM.    [name]   [facility] Specialty Technician

## 2018-12-11 MED FILL — BENLYSTA 200 MG/ML SUBCUTANEOUS SYRINGE: 28 days supply | Qty: 4 | Fill #6

## 2018-12-11 MED FILL — BENLYSTA 200 MG/ML SUBCUTANEOUS SYRINGE: 28 days supply | Qty: 4 | Fill #6 | Status: AC

## 2018-12-17 ENCOUNTER — Other Ambulatory Visit: Payer: Self-pay | Admitting: Interventional Radiology

## 2018-12-17 ENCOUNTER — Other Ambulatory Visit: Payer: Self-pay

## 2018-12-17 DIAGNOSIS — N2889 Other specified disorders of kidney and ureter: Secondary | ICD-10-CM

## 2018-12-17 DIAGNOSIS — D1771 Benign lipomatous neoplasm of kidney: Secondary | ICD-10-CM

## 2018-12-18 ENCOUNTER — Other Ambulatory Visit: Payer: Self-pay | Admitting: Interventional Radiology

## 2018-12-18 ENCOUNTER — Other Ambulatory Visit: Payer: Self-pay

## 2018-12-18 ENCOUNTER — Encounter: Payer: Self-pay | Admitting: Hematology and Oncology

## 2018-12-18 ENCOUNTER — Inpatient Hospital Stay: Payer: Medicare PPO | Attending: Family Medicine

## 2018-12-18 ENCOUNTER — Inpatient Hospital Stay (HOSPITAL_BASED_OUTPATIENT_CLINIC_OR_DEPARTMENT_OTHER): Payer: Medicare PPO | Admitting: Hematology and Oncology

## 2018-12-18 DIAGNOSIS — D72819 Decreased white blood cell count, unspecified: Secondary | ICD-10-CM | POA: Insufficient documentation

## 2018-12-18 DIAGNOSIS — D72818 Other decreased white blood cell count: Secondary | ICD-10-CM

## 2018-12-18 DIAGNOSIS — M329 Systemic lupus erythematosus, unspecified: Secondary | ICD-10-CM | POA: Diagnosis not present

## 2018-12-18 DIAGNOSIS — N2889 Other specified disorders of kidney and ureter: Secondary | ICD-10-CM | POA: Insufficient documentation

## 2018-12-18 DIAGNOSIS — Z79899 Other long term (current) drug therapy: Secondary | ICD-10-CM | POA: Diagnosis not present

## 2018-12-18 DIAGNOSIS — M791 Myalgia, unspecified site: Secondary | ICD-10-CM | POA: Diagnosis not present

## 2018-12-18 DIAGNOSIS — M542 Cervicalgia: Secondary | ICD-10-CM | POA: Diagnosis not present

## 2018-12-18 DIAGNOSIS — G43719 Chronic migraine without aura, intractable, without status migrainosus: Secondary | ICD-10-CM | POA: Diagnosis not present

## 2018-12-18 DIAGNOSIS — G518 Other disorders of facial nerve: Secondary | ICD-10-CM | POA: Diagnosis not present

## 2018-12-18 LAB — CBC WITH DIFFERENTIAL/PLATELET
Abs Immature Granulocytes: 0.01 10*3/uL (ref 0.00–0.07)
Basophils Absolute: 0 10*3/uL (ref 0.0–0.1)
Basophils Relative: 1 %
Eosinophils Absolute: 0 10*3/uL (ref 0.0–0.5)
Eosinophils Relative: 1 %
HCT: 36.4 % (ref 36.0–46.0)
Hemoglobin: 12 g/dL (ref 12.0–15.0)
Immature Granulocytes: 0 %
Lymphocytes Relative: 36 %
Lymphs Abs: 1.1 10*3/uL (ref 0.7–4.0)
MCH: 31.3 pg (ref 26.0–34.0)
MCHC: 33 g/dL (ref 30.0–36.0)
MCV: 95 fL (ref 80.0–100.0)
Monocytes Absolute: 0.5 10*3/uL (ref 0.1–1.0)
Monocytes Relative: 16 %
Neutro Abs: 1.4 10*3/uL — ABNORMAL LOW (ref 1.7–7.7)
Neutrophils Relative %: 46 %
Platelets: 249 10*3/uL (ref 150–400)
RBC: 3.83 MIL/uL — ABNORMAL LOW (ref 3.87–5.11)
RDW: 12.5 % (ref 11.5–15.5)
WBC: 3 10*3/uL — ABNORMAL LOW (ref 4.0–10.5)
nRBC: 0 % (ref 0.0–0.2)

## 2018-12-18 NOTE — Assessment & Plan Note (Signed)
She had history of renal mass status post ablation. So far, she had no signs or symptoms of flank pain or hematuria With stable CT imaging, I recommend follow-up with urologist only

## 2018-12-18 NOTE — Assessment & Plan Note (Signed)
She has chronic stable SLE and I think that causes chronic intermittent lymphadenopathy and leukopenia. She appears to be responding well to treatment I would defer to her rheumatologist for further management

## 2018-12-18 NOTE — Progress Notes (Signed)
[facility] OFFICE PROGRESS NOTE  [name], MD  ASSESSMENT & PLAN:  SLE (systemic lupus erythematosus) She has chronic stable SLE and I think that causes chronic intermittent lymphadenopathy and leukopenia. She appears to be responding well to treatment I would defer to her rheumatologist for further management  Leukopenia She has chronic intermittent leukopenia. It could be combination from her treatment for SLE, the SLE itself and also constitutional from her African-American heritage. Despite chronic leukopenia, she is relatively asymptomatic without the need for chronic antibiotic treatment. I recommend close observation only. Since she is getting regular follow-up through her primary care doctor and rheumatologist, I will discontinue follow-up. Since she does not need any treatment for leukopenia, she can be observed closely and I will see her back in the future if needed  Renal mass, left She had history of renal mass status post ablation. So far, she had no signs or symptoms of flank pain or hematuria With stable CT imaging, I recommend follow-up with urologist only   No orders of the defined types were placed in this encounter.   INTERVAL HISTORY: Dawn Marsh 53 y.o. female returns for further follow-up She is doing well No recent infection, fever or chills No new lymphadenopathy She has close follow-up with her primary doctor, rheumatologist as well as urologist for her renal mass She denies recent flare of lupus  SUMMARY OF HEMATOLOGIC HISTORY:  This patient had background history of systemic lupus. She had history of severe, progressive pancytopenia with low white count and low hemoglobin. Methotrexate was discontinued and her medication was changed to [place]. Subsequently, she changed her rheumatologist and she was placed on methotrexate again, 15 mg weekly since July 2015, off by the end of 2015 Her treatment was subsequently switched to  Imuran since May 2016 The patient had prior diagnosis of kidney cancer status post ablation. She underwent CT scan at the body in July 2016, MR of the head and evaluation of lymphadenopathy which came back consistent with reactive LN.   I have reviewed the past medical history, past surgical history, social history and family history with the patient and they are unchanged from previous note.  ALLERGIES:  is allergic to amoxicillin; amoxil [amoxicillin trihydrate]; penicillins; penicillins; and latex.  MEDICATIONS:  Current Outpatient Medications  Medication Sig Dispense Refill  . hydroxychloroquine (PLAQUENIL) 200 MG tablet Take 200 mg by mouth daily.    Marland Kitchen triamcinolone cream (KENALOG) 0.1 % Apply 1 application topically daily as needed.    . belimumab (BENLYSTA) 120 MG SOLR injection Inject 1 mL into the vein once a week. 200 mg weekly    . dexamethasone (DECADRON) 10 MG/ML injection Inject 4 mLs into the vein every 6 (six) weeks.     . DULoxetine (CYMBALTA) 20 MG capsule Take 20 mg by mouth daily.    . naproxen sodium (ALEVE) 220 MG tablet Take 220 mg by mouth daily as needed.     No current facility-administered medications for this visit.      REVIEW OF SYSTEMS:   Constitutional: Denies fevers, chills or night sweats Eyes: Denies blurriness of vision Ears, nose, mouth, throat, and face: Denies mucositis or sore throat Respiratory: Denies cough, dyspnea or wheezes Cardiovascular: Denies palpitation, chest discomfort or lower extremity swelling Gastrointestinal:  Denies nausea, heartburn or change in bowel habits Skin: Denies abnormal skin rashes Lymphatics: Denies new lymphadenopathy or easy bruising Neurological:Denies numbness, tingling or new weaknesses Behavioral/Psych: Mood is stable, no new changes  All other systems  were reviewed with the patient and are negative.  PHYSICAL EXAMINATION: ECOG PERFORMANCE STATUS: 0 - Asymptomatic  Vitals:   12/18/18 1228  BP: (!)  132/91  Pulse: 73  Resp: 18  Temp: 98.3 F (36.8 C)  SpO2: 100%   Filed Weights   12/18/18 1228  Weight: 154 lb 6.4 oz (70 kg)    GENERAL:alert, no distress and comfortable SKIN: skin color, texture, turgor are normal, no rashes or significant lesions EYES: normal, Conjunctiva are pink and non-injected, sclera clear OROPHARYNX:no exudate, no erythema and lips, buccal mucosa, and tongue normal  NECK: supple, thyroid normal size, non-tender, without nodularity LYMPH:  no palpable lymphadenopathy in the cervical, axillary or inguinal LUNGS: clear to auscultation and percussion with normal breathing effort HEART: regular rate & rhythm and no murmurs and no lower extremity edema ABDOMEN:abdomen soft, non-tender and normal bowel sounds Musculoskeletal:no cyanosis of digits and no clubbing  NEURO: alert & oriented x 3 with fluent speech, no focal motor/sensory deficits  LABORATORY DATA:  I have reviewed the data as listed     Component Value Date/Time   NA 143 09/25/2017 0824   NA 141 09/10/2014 1445   K 3.9 09/25/2017 0824   K 3.9 09/10/2014 1445   CL 108 09/25/2017 0824   CO2 26 09/25/2017 0824   CO2 26 09/10/2014 1445   GLUCOSE 89 09/25/2017 0824   GLUCOSE 101 09/10/2014 1445   BUN 14 09/25/2017 0824   BUN 15.4 09/10/2014 1445   CREATININE 0.91 09/25/2017 0824   CREATININE 0.8 09/10/2014 1445   CALCIUM 9.7 09/25/2017 0824   CALCIUM 9.3 09/10/2014 1445   PROT 8.0 09/25/2017 0824   PROT 8.3 09/10/2014 1445   ALBUMIN 4.2 09/25/2017 0824   ALBUMIN 4.0 09/10/2014 1445   AST 27 09/25/2017 0824   AST 24 09/10/2014 1445   ALT 26 09/25/2017 0824   ALT 18 09/10/2014 1445   ALKPHOS 171 (H) 09/25/2017 0824   ALKPHOS 162 (H) 09/10/2014 1445   BILITOT 0.4 09/25/2017 0824   BILITOT 0.50 09/10/2014 1445   GFRNONAA >60 09/25/2017 0824   GFRAA >60 09/25/2017 0824    No results found for: SPEP, UPEP  Lab Results  Component Value Date   WBC 3.0 (L) 12/18/2018   NEUTROABS 1.4  (L) 12/18/2018   HGB 12.0 12/18/2018   HCT 36.4 12/18/2018   MCV 95.0 12/18/2018   PLT 249 12/18/2018      Chemistry      Component Value Date/Time   NA 143 09/25/2017 0824   NA 141 09/10/2014 1445   K 3.9 09/25/2017 0824   K 3.9 09/10/2014 1445   CL 108 09/25/2017 0824   CO2 26 09/25/2017 0824   CO2 26 09/10/2014 1445   BUN 14 09/25/2017 0824   BUN 15.4 09/10/2014 1445   CREATININE 0.91 09/25/2017 0824   CREATININE 0.8 09/10/2014 1445      Component Value Date/Time   CALCIUM 9.7 09/25/2017 0824   CALCIUM 9.3 09/10/2014 1445   ALKPHOS 171 (H) 09/25/2017 0824   ALKPHOS 162 (H) 09/10/2014 1445   AST 27 09/25/2017 0824   AST 24 09/10/2014 1445   ALT 26 09/25/2017 0824   ALT 18 09/10/2014 1445   BILITOT 0.4 09/25/2017 0824   BILITOT 0.50 09/10/2014 1445      I spent 10 minutes counseling the patient face to face. The total time spent in the appointment was 15 minutes and more than 50% was on counseling.   All questions were  answered. The patient knows to call the clinic with any problems, questions or concerns. No barriers to learning was detected.    Heath Lark, MD 10/6/20201:54 PM

## 2018-12-18 NOTE — Assessment & Plan Note (Signed)
She has chronic intermittent leukopenia. It could be combination from her treatment for SLE, the SLE itself and also constitutional from her African-American heritage. Despite chronic leukopenia, she is relatively asymptomatic without the need for chronic antibiotic treatment. I recommend close observation only. Since she is getting regular follow-up through her primary care doctor and rheumatologist, I will discontinue follow-up. Since she does not need any treatment for leukopenia, she can be observed closely and I will see her back in the future if needed

## 2018-12-24 ENCOUNTER — Encounter (HOSPITAL_COMMUNITY): Payer: Self-pay

## 2018-12-24 ENCOUNTER — Other Ambulatory Visit: Payer: Self-pay

## 2018-12-24 ENCOUNTER — Ambulatory Visit (HOSPITAL_COMMUNITY)
Admission: RE | Admit: 2018-12-24 | Discharge: 2018-12-24 | Disposition: A | Discharge status: Home / Self Care | Payer: Medicare PPO | Source: Ambulatory Visit | Attending: Interventional Radiology | Admitting: Interventional Radiology

## 2018-12-24 DIAGNOSIS — D3002 Benign neoplasm of left kidney: Secondary | ICD-10-CM | POA: Diagnosis not present

## 2018-12-24 DIAGNOSIS — N2889 Other specified disorders of kidney and ureter: Secondary | ICD-10-CM | POA: Diagnosis not present

## 2018-12-24 DIAGNOSIS — D1771 Benign lipomatous neoplasm of kidney: Secondary | ICD-10-CM | POA: Diagnosis not present

## 2018-12-24 LAB — POCT I-STAT CREATININE: Creatinine, Ser: 0.9 mg/dL (ref 0.44–1.00)

## 2018-12-24 MED ORDER — IOHEXOL 300 MG/ML  SOLN
100.0000 mL | Freq: Once | INTRAMUSCULAR | Status: AC | PRN
  Administered 2018-12-24: 08:00:00 100 mL via INTRAVENOUS

## 2018-12-24 MED ORDER — SODIUM CHLORIDE (PF) 0.9 % IJ SOLN
INTRAMUSCULAR | Status: AC
  Filled 2018-12-24: qty 50

## 2018-12-26 ENCOUNTER — Encounter: Payer: Self-pay | Admitting: *Deleted

## 2018-12-26 ENCOUNTER — Other Ambulatory Visit: Payer: Self-pay

## 2018-12-26 ENCOUNTER — Ambulatory Visit
Admission: RE | Admit: 2018-12-26 | Discharge: 2018-12-26 | Disposition: A | Discharge status: Home / Self Care | Payer: Medicare PPO | Source: Ambulatory Visit | Attending: Interventional Radiology | Admitting: Interventional Radiology

## 2018-12-26 DIAGNOSIS — D1771 Benign lipomatous neoplasm of kidney: Secondary | ICD-10-CM | POA: Diagnosis not present

## 2018-12-26 HISTORY — PX: IR RADIOLOGIST EVAL & MGMT: IMG5224

## 2018-12-26 NOTE — Progress Notes (Signed)
Patient ID: Dawn Marsh, female   DOB: 03-Feb-1966, 53 y.o.   MRN: [id number]       Chief Complaint:   Left renal angiomyolipoma, outpatient surveillance  Referring Physician(s): McKenzie  History of Present Illness: Dawn Marsh is a 53 y.o. 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  She has a chronic history of SLE and leukopenia.  Overall she remains asymptomatic.  No flank or abdominal pain.  No fever, dysuria or hematuria.  She has had recent surveillance imaging.  Interval CT from 12/24/2018 demonstrates slow enlargement of the left inferior pole angiomyolipoma now measuring up to 3.4 cm, previously 2.2 cm.  No interval signs of spontaneous intralesional hemorrhage or retroperitoneal hemorrhage.  The angiomyolipoma does have soft tissue enhancing components as well as enhancing vascularity.  Lesion remains less than 4 cm in size.  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   Hypertension    Left renal mass    Leukopenia 12/27/2012   Lupus ([place])    Migraine    Nontoxic simple goitre 12/27/2012   Rhinitis, allergic    seasonal   Seizures (HCC)    migraines   SLE (systemic lupus erythematosus) ([place]) 12/27/2012   Vertigo     Past Surgical History:  Procedure Laterality Date   ABDOMINAL HYSTERECTOMY     ABDOMINAL HYSTERECTOMY  2011   IR RADIOLOGIST EVAL & MGMT  01/12/2017   IR RADIOLOGIST EVAL & MGMT  02/07/2017   IR RADIOLOGIST EVAL & MGMT  12/07/2017   KIDNEY SURGERY     MYOMECTOMY ABDOMINAL  APPROACH     myomectomy x 2 (prior to the hysterectomy)  2002 and 2006   TUMOR REMOVAL      Allergies: Amoxicillin, Amoxil [amoxicillin trihydrate], Penicillins, Penicillins, and Latex  Medications: Prior to Admission medications   Medication Sig Start Date End Date Taking? Authorizing Provider  belimumab (BENLYSTA) 120 MG SOLR injection Inject 1 mL into the vein once a week. 200 mg weekly    [provider]  dexamethasone (DECADRON) 10 MG/ML injection Inject 4 mLs into the vein every 6 (six) weeks.     [provider]  DULoxetine (CYMBALTA) 20 MG capsule Take 20 mg by mouth daily. 05/05/16   [provider]  hydroxychloroquine (PLAQUENIL) 200 MG tablet Take 200 mg by mouth daily. 09/18/18   [provider]  naproxen sodium (ALEVE) 220 MG tablet Take 220 mg by mouth daily as needed.    [provider]  triamcinolone cream (KENALOG) 0.1 % Apply 1 application topically daily as needed. 08/20/18   [provider]     No family history on file.  Social History   Socioeconomic History   Marital status: Divorced    Spouse name: Not on file   Number of children: Not on file   Years of education: Not on file   Highest education level: Not on file  Occupational History   Not on file  Social Needs   Financial resource strain: Not on  file   Food insecurity    Worry: Not on file    Inability: Not on file   Transportation needs    Medical: Not on file    Non-medical: Not on file  Tobacco Use   Smoking status: Never Smoker   Smokeless tobacco: Never Used  Substance and Sexual Activity   Alcohol use: Yes    Comment: occas-maybe once a month   Drug use: No   Sexual activity: Not on file  Lifestyle   Physical activity    Days per week: Not on file    Minutes per session: Not on file   Stress: Not on file  Relationships   Social connections    Talks on phone: Not on file    Gets together: Not on file    Attends  religious service: Not on file    Active member of club or organization: Not on file    Attends meetings of clubs or organizations: Not on file    Relationship status: Not on file  Other Topics Concern   Not on file  Social History Narrative   ** Merged History Encounter **        Review of Systems  Review of Systems: A 12 point ROS discussed and pertinent positives are indicated in the HPI above.  All other systems are negative.  Physical Exam No direct physical exam was performed, telephone visit only today.   Vital Signs: There were no vitals taken for this visit.  Imaging: Ct Abdomen W Wo Contrast  Result Date: 12/24/2018 CLINICAL DATA:  Left renal mass, prior cryoablation. Separate left renal angiomyolipoma. EXAM: CT ABDOMEN WITHOUT AND WITH CONTRAST TECHNIQUE: Multidetector CT imaging of the abdomen was performed following the standard protocol before and following the bolus administration of intravenous contrast. CONTRAST:  151mL OMNIPAQUE IOHEXOL 300 MG/ML  SOLN COMPARISON:  Multiple exams, including 09/25/2017 FINDINGS: Lower chest: Unremarkable Hepatobiliary: 2.0 by 1.3 cm segment 2 hepatic lesion has peripheral slightly nodular arterial phase enhancement characteristics with centripetal enhancement, as shown on image 17/6. Suspected second hemangioma measuring about 1.3 by 1.0 cm in the right hepatic lobe on image 24/16. Both lesions are stable. No new liver lesions. Gallbladder unremarkable. No biliary dilatation. Pancreas: Borderline dilatation of the dorsal pancreatic duct similar to prior. Spleen: Unremarkable Adrenals/Urinary Tract: Both adrenal glands appear unremarkable. Ablation site of the left mid kidney posterolaterally common no recurrent disease. Exophytic mass of the left kidney lower pole with fatty elements compatible with angiomyolipoma measuring 2.9 by 2.2 by 3.0 cm (volume = 10 cm^3), image 57/11. By my measurements this was previously 2.2 by 1.6 by 2.3 cm  (volume = 4.2 cm^3). In the right kidney upper pole, a 0.7 by 0.5 cm lesion on a small angiomyolipoma. Stomach/Bowel: Unremarkable Vascular/Lymphatic: Unremarkable Other: No supplemental non-categorized findings. Musculoskeletal: Unremarkable IMPRESSION: 1. Enlargement of the left renal angiomyolipoma, currently at 10 cubic cm in volume, previously 4.2 cubic cm. Maximum diameter 3.0 cm. 2. 0.7 cm angiomyolipoma of the right kidney upper pole. 3. No recurrence along the ablation site. 4. Two hemangiomas in the liver, similar to prior. Electronically Signed   By: Van Clines M.D.   On: 12/24/2018 08:21    Labs:  CBC: Recent Labs    12/18/18 1201  WBC 3.0*  HGB 12.0  HCT 36.4  PLT 249    COAGS: No results for input(s): INR, APTT in the last 8760 hours.  BMP: Recent Labs    12/24/18 0733  CREATININE 0.90  LIVER FUNCTION TESTS: No results for input(s): BILITOT, AST, ALT, ALKPHOS, PROT, ALBUMIN in the last 8760 hours.  TUMOR MARKERS: No results for input(s): AFPTM, CEA, CA199, CHROMGRNA in the last 8760 hours.  Assessment and Plan:  Interval slow enlargement of the left kidney lower pole exophytic angiomyolipoma previously measuring 2.2 cm now measuring up to 3.4 cm in maximal dimension.  Otherwise no signs of interval internal hemorrhage or retroperitoneal spontaneous hemorrhage.  Lesion remains consistent with a benign angiomyolipoma.  Lesion remains less than 4 cm in size.  Stable ablation defect in the left kidney upper pole region.  The interval growth over the last year was discussed in detail.  Lesion is now approaching 4 cm in size.  Again, we reviewed the embolization treatment option in detail, including the procedure, recovery, expected goals, outcomes, and continued surveillance.  All questions were addressed.  At this point, she would like to continue with close surveillance and not pursue any elective embolization treatment during the COVID-19 pandemic.  She does  have chronic SLE and leukopenia.  Plan: Repeat surveillance CT without and with contrast in 6 months.  Certainly if there is continued growth in the lesion or any signs of internal retroperitoneal hemorrhage we should consider proceeding with embolization therapy.    Electronically Signed: Greggory Keen 12/26/2018, 10:40 AM   I spent a total of    25 Minutes in remote  clinical consultation, greater than 50% of which was counseling/coordinating care for this patient with a left renal AML.    Visit type: Audio only (telephone). Audio (no video) only due to patient's lack of internet/smartphone capability. Alternative for in-person consultation at [facility], [address] [place], [place], [place]. This visit type was conducted due to national recommendations for restrictions regarding the COVID-19 Pandemic (e.g. social distancing).  This format is felt to be most appropriate for this patient at this time.  All issues noted in this document were discussed and addressed.

## 2018-12-28 NOTE — Unmapped (Signed)
[facility] Refill Coordination Note    Specialty Medication(s) to be Shipped:   Inflammatory Disorders: Benlysta    Other medication(s) to be shipped: n/a     Tonya Boyer, DOB: 05-15-1965  Phone: [phone] (home)       All above HIPAA information was verified with patient.     Completed refill call assessment today to schedule patient's medication shipment from the [facility] [phone]).       Specialty medication(s) and dose(s) confirmed: Regimen is correct and unchanged.   Changes to medications: Tonya Boyer reports no changes at this time.  Changes to insurance: No  Questions for the pharmacist: No    Confirmed patient received Welcome Packet with first shipment. The patient will receive a drug information handout for each medication shipped and additional FDA Medication Guides as required.       DISEASE/MEDICATION-SPECIFIC INFORMATION        For patients on injectable medications: Patient currently has 1 doses left.  Next injection is scheduled for 01/04/19.    SPECIALTY MEDICATION ADHERENCE     Medication Adherence    Patient reported X missed doses in the last month: 0  Specialty Medication: Benlysta  Patient is on additional specialty medications: No  Patient is on more than two specialty medications: No  Any gaps in refill history greater than 2 weeks in the last 3 months: no  Demonstrates understanding of importance of adherence: yes  Informant: patient                Benlysta 200mg /ml: Patient has 7 days of medication on hand      SHIPPING     Shipping address confirmed in Epic.     Delivery Scheduled: Yes, Expected medication delivery date: 01/09/19.     Medication will be delivered via UPS to the prescription address in Epic WAM.    Tonya Boyer   [facility] Specialty Technician

## 2019-01-08 MED FILL — BENLYSTA 200 MG/ML SUBCUTANEOUS SYRINGE: 28 days supply | Qty: 4 | Fill #7 | Status: AC

## 2019-01-08 MED FILL — BENLYSTA 200 MG/ML SUBCUTANEOUS SYRINGE: 28 days supply | Qty: 4 | Fill #7

## 2019-01-16 ENCOUNTER — Ambulatory Visit: Admit: 2019-01-16 | Discharge: 2019-01-17 | Payer: MEDICARE | Attending: Family | Primary: Family

## 2019-01-16 DIAGNOSIS — Z01812 Encounter for preprocedural laboratory examination: Secondary | ICD-10-CM | POA: Diagnosis not present

## 2019-01-16 DIAGNOSIS — Z20828 Contact with and (suspected) exposure to other viral communicable diseases: Secondary | ICD-10-CM | POA: Diagnosis not present

## 2019-01-16 NOTE — Unmapped (Signed)
COVID Pre-Procedure Intake Form     Assessment     Tonya Boyer is a 53 y.o. female presenting to [facility] for COVID testing.     Plan     If no testing performed, pt counseled on routine care for respiratory illness.  If testing performed, COVID sent.  Patient directed to Home given findings during today's visit.    Subjective     Tonya Boyer is a 53 y.o. female who [facility] the [facility] with complaints of the following:    Exposure History: In the last 21 days?     Have you traveled outside of [place]? No               Have you been in close contact with someone confirmed by a test to have COVID? (Close contact is within 6 feet for at least 10 minutes) No       Have you worked in a health care facility? No     Lived or worked facility like a nursing home, group home, or assisted living?    No         Are you scheduled to have surgery or a procedure in the next 3 days? Yes               Are you scheduled to receive cancer chemotherapy within the next 7 days?    No     Have you ever been tested before for COVID-19 with a swab of your nose? No   Are you a healthcare worker being tested so to return to work No     Right now,  do you have any of the following that developed over the past 7 days (as stated by patient on intake form):    Subjective fever (felt feverish) No   Chills (especially repeated shaking chills) No   Severe fatigue (felt very tired) No   Muscle aches No   Runny nose No   Sore throat No   Loss of taste or smell No   Cough (new onset or worsening of chronic cough) No   Shortness of breath No   Nausea or vomiting No   Headache No   Abdominal Pain No   Diarrhea (3 or more loose stools in last 24 hours) No Scribe's Attestation: Paulita Fujita, FNP obtained and performed the history, physical exam and medical decision making elements that were entered into the chart.  Signed by Alleen Borne serving as Scribe, on 01/16/2019 11:19 AM      The documentation recorded by the scribe accurately reflects the service I personally performed and the decisions made by me. Aida Puffer, FNP  January 18, 2019 11:57 AM

## 2019-01-18 ENCOUNTER — Ambulatory Visit: Admit: 2019-01-18 | Discharge: 2019-01-19 | Payer: MEDICARE

## 2019-01-18 DIAGNOSIS — Z0121 Encounter for dental examination and cleaning with abnormal findings: Principal | ICD-10-CM

## 2019-01-18 DIAGNOSIS — K0510 Chronic gingivitis, plaque induced: Principal | ICD-10-CM

## 2019-01-18 NOTE — Unmapped (Signed)
Comprehensive Exam     ? Tonya Boyer is a 53 y.o. female who was identified by name and DOB.  The patient presents to the hospital dental clinic today for a comprehensive exam.    CC: My mouth is dry and I think I have cavities.    ? Tonya Boyer was referred by Dr. Otho Perl  for comprehensive dental care in a hospital setting due to patient's h/o systemic lupus erythematosus (SLE) ??and dry mouth with multiple dental problems.      Patient Active Problem List   Diagnosis   ??? Systemic lupus erythematosus (CMS-HCC)   ??? Fibromyalgia   ??? Insomnia       Past Medical History:   Diagnosis Date   ??? Benign renal tumor    ??? Genital herpes    ??? Hypertension    ??? Lymphadenopathy    ??? Migraine    ??? SLE (systemic lupus erythematosus) (CMS-HCC)    ??? Vitamin D deficiency        Current Outpatient Medications on File Prior to Visit   Medication Sig Dispense Refill   ??? belimumab 200 mg/mL Syrg INJECT THE CONTENTS OF 1 SYRINGE (200MG ) UNDER THE SKIN EVERY 7 DAYS 12 mL 3   ??? clotrimazole (LOTRIMIN) 1 % cream Apply topically Two (2) times a day.     ??? desonide (DESOWEN) 0.05 % cream Apply topically Two (2) times a day. 60 g 0   ??? dexamethasone (DECADRON) 10 mg/mL injection Infuse into a venous catheter. Dose unknown, every 6-8 weeks. Injections. Given with lidocaine and marcaine.     ??? DULoxetine (CYMBALTA) 20 MG capsule Take 20 mg by mouth daily.  0   ??? empty container Misc use as directed 1 each PRN   ??? hydrOXYchloroQUINE (PLAQUENIL) 200 mg tablet Take 1 tablet (200 mg total) by mouth daily. 90 tablet 3   ??? naproxen sodium (ALEVE) 220 MG tablet Take 220 mg by mouth continuous as needed for pain.     ??? triamcinolone (KENALOG) 0.1 % cream        No current facility-administered medications on file prior to visit.        Allergies   Allergen Reactions   ??? Amoxicillin Hives   ??? Penicillins Hives   ??? Latex Other (See Comments)     Skin irritation   ??? Penicillin G    ??? Prednisone    ??? Latex, Natural Rubber Rash [place] Questions completed.   1. Does your partner (caregivers) ever threaten you or try to control you? No  2. Is abuse, violence or sexual assault a problem for you in any way? No    ? Reviewed chief complaint, medical history, dental history, allergies, and medications prior to examination.      Objective    Exam performed by Vivianne Spence, DDS with Viona Gilmore DAII acting as scribe.    Vitals:    01/18/19 0905   BP: 145/94   BP Site: R Arm   BP Position: Sitting   BP Cuff Size: Medium   Pulse: 81   SpO2: 99%       Pain scale: No pain reported      Radiographs: Panoramic: 1 Bitewings: 4 PA: 1             Findings:  Missing teeth, caries, existing restorations  Osseous structures appear WNL    Extraoral exam: Head size, head shape, facial symmetry, skin, complexion, pigmentation, conjunctiva, oral labia, parotid gland, submandibular  gland, thyroid, cervical and preauricular lymph nodes, submandibular and submental lymph nodes, tonsilar and occipital lymph nodes, and supraclavicular lymph nodes WNL.  No swelling or lymphadenopathy. TMJ asymptomatic without clicks or crepitations.    Intraoral exam: Lips, buccal mucosa, vestibules and frenula, gingiva and ridges, hard and soft palate, oropharynx and tonsils, salivary glands, tongue, FOM, and submandibular area without mass or lesion. Oral cancer screening performed and negative.  ? Occlusion/VDO: WNL  ? Tori/Exostosis: N/A    Hard Tissue Exam:    Significant Findings:  ? Missing: #31  ? Existing Restorations: See toothchart  ? Retained Roots: N/A  ? Decay/Caries:#6, #12, #13, #21, #28, #29 and #30  o Grossly carious/non-restorable: None  o Large caries/need RCT: None  o Questionable prognosis: None  o Restorable: #6, #12, #13, #21, #28, #29 and #30  ? Nonfunctional teeth: None  ? Other (abfraction, attrition, fracture, etc): None  ? Percussion sensitive: #8  ? Palpation sensitive: #8  ? Pulp vitality: Endo ice test negative tooth #8 ( tooth 8 -RCT) Periodontal exam completed and charted:  Periodontal Charting: Completed full mouth periodontal probing. Generalized 2-56mm periodontal probing depths with localized 4mm periodontal probing depths and light localized BOP.    Periodontal Diagnosis: Generalized healthy, localized slight gingivitis.  ??  Gingiva: Generalized healthy, localized rolled margins.  ??  Plaque Biofilm: Generalized light  ??  Calculus: Generalized light    Periodontal Prognosis: Good    Preventive and/or Periodontal Treatment Procedures Indicated: (Z6109) Adult Prophylaxis  ??    Overall Oral Hygiene: Good      Plan    ? Discussed findings with patient. Explained to patient that 7 teeth presented with dental caries that need to be restored.    ? Patient verbalized understanding of discussion and treatment options.  The treatment plan was finalized as described below:    1. Adult prophy ( completed today)  2. Restorative treatment needed: 6,12,13,21,28,29,30    Rx: N/A    ? The patient was given the opportunity to ask questions and all concerns were addressed.    ? The patient was satisfied with today's visit and departed in stable condition.    Provider: Lequita Halt DDS  DA: Viona Gilmore DAII      NV: Restore teeth 12 and [phone]

## 2019-01-18 NOTE — Unmapped (Addendum)
Patrecia Veiga is a  53yr old female who presents today for prophy following comprehensive exam.    COVID-19 test results a sof 01/16/19: Negative    Medical history:   Patient Active Problem List   Diagnosis   ??? Systemic lupus erythematosus (CMS-HCC)   ??? Fibromyalgia   ??? Insomnia       Past Medical History:   Diagnosis Date   ??? Benign renal tumor    ??? Genital herpes    ??? Hypertension    ??? Lymphadenopathy    ??? Migraine    ??? SLE (systemic lupus erythematosus) (CMS-HCC)    ??? Vitamin D deficiency        Medications:   Current Outpatient Medications   Medication Sig Dispense Refill   ??? belimumab 200 mg/mL Syrg INJECT THE CONTENTS OF 1 SYRINGE (200MG ) UNDER THE SKIN EVERY 7 DAYS 12 mL 3   ??? desonide (DESOWEN) 0.05 % cream Apply topically Two (2) times a day. 60 g 0   ??? dexamethasone (DECADRON) 10 mg/mL injection Infuse into a venous catheter. Dose unknown, every 6-8 weeks. Injections. Given with lidocaine and marcaine.     ??? DULoxetine (CYMBALTA) 20 MG capsule Take 20 mg by mouth daily.  0   ??? hydrOXYchloroQUINE (PLAQUENIL) 200 mg tablet Take 1 tablet (200 mg total) by mouth daily. 90 tablet 3   ??? naproxen sodium (ALEVE) 220 MG tablet Take 220 mg by mouth continuous as needed for pain.     ??? clotrimazole (LOTRIMIN) 1 % cream Apply topically Two (2) times a day.     ??? empty container Misc use as directed 1 each PRN   ??? triamcinolone (KENALOG) 0.1 % cream        No current facility-administered medications for this visit.        Allergies:  Allergies   Allergen Reactions   ??? Amoxicillin Hives   ??? Penicillins Hives   ??? Latex Other (See Comments)     Skin irritation   ??? Penicillin G    ??? Prednisone    ??? Latex, Natural Rubber Rash        Social History:    Social History     Socioeconomic History   ??? Marital status: Single     Spouse name: Not on file   ??? Number of children: Not on file   ??? Years of education: Not on file   ??? Highest education level: Not on file   Occupational History   ??? Not on file   Social Needs ??? Financial resource strain: Not on file   ??? Food insecurity     Worry: Not on file     Inability: Not on file   ??? Transportation needs     Medical: Not on file     Non-medical: Not on file   Tobacco Use   ??? Smoking status: Never Smoker   ??? Smokeless tobacco: Never Used   Substance and Sexual Activity   ??? Alcohol use: Yes     Alcohol/week: 0.0 standard drinks     Comment: rarely    ??? Drug use: No   ??? Sexual activity: Not on file   Lifestyle   ??? Physical activity     Days per week: Not on file     Minutes per session: Not on file   ??? Stress: Not on file   Relationships   ??? Social Wellsite geologist on phone: Not on file  Gets together: Not on file     Attends religious service: Not on file     Active member of club or organization: Not on file     Attends meetings of clubs or organizations: Not on file     Relationship status: Not on file   Other Topics Concern   ??? Not on file   Social History Narrative   ??? Not on file     Periodontal charting: done at comprehensive exam - patient informed of pockets over 3mm and discussed periodontal disease/need for daily flossing    Last prophy: about 1-1/20yrs ago per patient    Blood Pressure   Noted in Rooming       X-Rays: none taken - BWX, 1 pax-ray maxillary ants, and pan taken 01/18/2019    Gingiva:  Localized inflammtion - light bleeding  Plaque present:  Light  Calculus present: minimal mand ants    TX:  Adult prophy - hand scaled, polished and flossed all contacts.  Patient had comprehensive exam prior to hygiene appointment and all treatment was discussed with patient.  Misty Stanley discussed estimate with patient and scheduled her for restorative treatment.    Oral Hygiene Instruction:  Reviewed brushing technique, instructed patient to brush at least 3 times daily, after meals, went over how to floss, and instructed patient to floss at night, and use antiseptic oral rinse.     Plan: restorative with Dr. Gerri Spore - will schedule after discussing financial estimate with Misty Stanley Next Hygiene visit:  Recall 6 months (May 2021)  Next Clinic visit: Dr. Gerri Spore #12- O comp , #13-V comp     Marybelle Killings, RDH,BS

## 2019-01-18 NOTE — Unmapped (Signed)
Periodontal Evaluation    Chief Complaint: Patient presents for periodontal evaluation in conjunction with a Comprehensive New Patient Exam.    COVID-19 Pre-Procedural Test Results: Negative (01/16/19)    Medical History: Medical history reviewed by the dentist with the patient. Dr. Gerri Spore reports no contraindications to today's treatment, including periodontal probing. Patient is not indicated for antibiotic pre-medication prior to dental treatment.    Past Medical History:   Diagnosis Date   ??? Benign renal tumor    ??? Genital herpes    ??? Hypertension    ??? Lymphadenopathy    ??? Migraine    ??? SLE (systemic lupus erythematosus) (CMS-HCC)    ??? Vitamin D deficiency        Patient Active Problem List   Diagnosis   ??? Systemic lupus erythematosus (CMS-HCC)   ??? Fibromyalgia   ??? Insomnia     Vital Signs: DA obtained the following vital signs.    Vitals:    01/18/19 0905   BP: 145/94   BP Site: R Arm   BP Position: Sitting   BP Cuff Size: Medium   Pulse: 81   SpO2: 99%       Medications:     Current Outpatient Medications on File Prior to Visit   Medication Sig Dispense Refill   ??? belimumab 200 mg/mL Syrg INJECT THE CONTENTS OF 1 SYRINGE (200MG ) UNDER THE SKIN EVERY 7 DAYS 12 mL 3   ??? clotrimazole (LOTRIMIN) 1 % cream Apply topically Two (2) times a day.     ??? desonide (DESOWEN) 0.05 % cream Apply topically Two (2) times a day. 60 g 0   ??? dexamethasone (DECADRON) 10 mg/mL injection Infuse into a venous catheter. Dose unknown, every 6-8 weeks. Injections. Given with lidocaine and marcaine.     ??? DULoxetine (CYMBALTA) 20 MG capsule Take 20 mg by mouth daily.  0   ??? empty container Misc use as directed 1 each PRN   ??? hydrOXYchloroQUINE (PLAQUENIL) 200 mg tablet Take 1 tablet (200 mg total) by mouth daily. 90 tablet 3   ??? naproxen sodium (ALEVE) 220 MG tablet Take 220 mg by mouth continuous as needed for pain.     ??? triamcinolone (KENALOG) 0.1 % cream        No current facility-administered medications on file prior to visit. Radiographs: DA exposed 4 Bitewings, 1 PA, and 1 PANO today.    Periodontal Charting: Completed full mouth periodontal probing. Generalized 2-50mm periodontal probing depths with localized 4mm periodontal probing depths and light localized BOP.    Periodontal Diagnosis: Generalized healthy, localized slight gingivitis.    Gingiva: Generalized healthy, localized rolled margins.    Plaque Biofilm: Generalized light    Calculus: Generalized light    Periodontal Prognosis: Good    Preventive and/or Periodontal Treatment Procedures Indicated: (G9562) Adult Prophylaxis    Recommended Treatment Sequence    Next Appointment: Adult Prophylaxis  Re-Evaluation Appointment: n/a  Recall/Maintenance Appointment: 6 month recare    Care Plan Discussion: Informed patient of all clinical findings, including periodontal diagnosis and any/all areas of gingivitis. Showed patient radiographs and identified any areas of early loss of crestal bone, widening of the PDL, radiographic calculus, and horizontal or vertical bone loss. Informed patient of the need for an Adult Prophylaxis to remove the plaque and calculus. Reviewed treatment risks, benefits, expected outcome, and prognosis. Patient stated that they understood everything discussed and would like to move forward with the proposed treatment.    Please see comprehensive exam  note for further exam findings, treatment needs.    Luberta Mutter, RDH, MS

## 2019-01-21 DIAGNOSIS — Z6826 Body mass index (BMI) 26.0-26.9, adult: Secondary | ICD-10-CM | POA: Diagnosis not present

## 2019-01-21 DIAGNOSIS — Z1231 Encounter for screening mammogram for malignant neoplasm of breast: Secondary | ICD-10-CM | POA: Diagnosis not present

## 2019-01-21 DIAGNOSIS — Z01419 Encounter for gynecological examination (general) (routine) without abnormal findings: Secondary | ICD-10-CM | POA: Diagnosis not present

## 2019-01-22 NOTE — Unmapped (Signed)
I was immediately available via phone/pager or present on site.  I reviewed and discussed the case with the resident and/or hygienist, but did not see the patient.  I agree with the assessment and plan as documented in the resident's note. Lequita Halt DDS

## 2019-01-23 ENCOUNTER — Ambulatory Visit: Admit: 2019-01-23 | Discharge: 2019-01-24 | Attending: General Practice | Primary: General Practice

## 2019-01-23 DIAGNOSIS — K0261 Dental caries on smooth surface limited to enamel: Principal | ICD-10-CM

## 2019-01-23 NOTE — Unmapped (Signed)
[facility]  Restorative Appointment      Procedure(s) completed today: Restorative #12 and 13    Tonya Boyer is a 53 y.o. female who presents for restorative treatment on teeth #12 and 13 per treatment plan.    Patient Active Problem List   Diagnosis   ??? Systemic lupus erythematosus (CMS-HCC)   ??? Fibromyalgia   ??? Insomnia       Past Medical History:   Diagnosis Date   ??? Benign renal tumor    ??? Genital herpes    ??? Hypertension    ??? Lymphadenopathy    ??? Migraine    ??? SLE (systemic lupus erythematosus) (CMS-HCC)    ??? Vitamin D deficiency        Current Outpatient Medications:   ???  belimumab 200 mg/mL Syrg, INJECT THE CONTENTS OF 1 SYRINGE (200MG ) UNDER THE SKIN EVERY 7 DAYS, Disp: 12 mL, Rfl: 3  ???  clotrimazole (LOTRIMIN) 1 % cream, Apply topically Two (2) times a day., Disp: , Rfl:   ???  desonide (DESOWEN) 0.05 % cream, Apply topically Two (2) times a day., Disp: 60 g, Rfl: 0  ???  dexamethasone (DECADRON) 10 mg/mL injection, Infuse into a venous catheter. Dose unknown, every 6-8 weeks. Injections. Given with lidocaine and marcaine., Disp: , Rfl:   ???  DULoxetine (CYMBALTA) 20 MG capsule, Take 20 mg by mouth daily., Disp: , Rfl: 0  ???  empty container Misc, use as directed, Disp: 1 each, Rfl: PRN  ???  hydrOXYchloroQUINE (PLAQUENIL) 200 mg tablet, Take 1 tablet (200 mg total) by mouth daily., Disp: 90 tablet, Rfl: 3  ???  naproxen sodium (ALEVE) 220 MG tablet, Take 220 mg by mouth continuous as needed for pain., Disp: , Rfl:   ???  triamcinolone (KENALOG) 0.1 % cream, , Disp: , Rfl:     Allergies   Allergen Reactions   ??? Amoxicillin Hives   ??? Penicillins Hives   ??? Latex Other (See Comments)     Skin irritation   ??? Penicillin G    ??? Prednisone    ??? Latex, Natural Rubber Rash       Social History     Tobacco Use   ??? Smoking status: Never Smoker   ??? Smokeless tobacco: Never Used   Substance Use Topics   ??? Alcohol use: Yes     Alcohol/week: 0.0 standard drinks     Comment: rarely Indication:Dental caries    Objective:     Vitals:    01/23/19 0952   BP: 139/91   BP Site: R Arm   BP Position: Sitting   Temp: 35.9 ??C (96.7 ??F)   SpO2: 100%      X-rays:  no     Anesthesia:  ? Topical anesthesia (Benzocaine 20%) applied.   ? Type of Anesthesia used:  1 carpule Lidocaine 2% with epinephrine 1:100,000 via infiltration  ? Aspiration negative  ? Nitrous Oxide:  No     Composite restoration:   Teeth and surfaces: 12-O and 13 Buccal  Caries excavated.  Teeth prepared for composite.  The extent of caries was limited to enamelCotton roll isolation obtained.  Etched enamel and dentin using  32% phosphoric acid for 15 seconds and rinsed thoroughly. Removed excess water with a brief burst of air.  Bonding material placed, air dried until no movement of the bonding was seen.  Repeated then light cured for 10 sec.  Flowable composite shade A3 used to line the preparation  and cured.  Composite shade A3 material placed in increments and cured. Occlusion and contact checked and adjusted. Composite finished and polished.    Patient advised of possible normal sensitivity to hot and cold for the next few days/weeks. Patient tolerated treatment well and left in stable condition.  Patient satisfied with today's treatment.      Provider:Taher Vannote Gerri Spore DDS  assisted by Dental assistant Edgar Frisk    Next visit:  Restorative

## 2019-01-29 DIAGNOSIS — G518 Other disorders of facial nerve: Secondary | ICD-10-CM | POA: Diagnosis not present

## 2019-01-29 DIAGNOSIS — M791 Myalgia, unspecified site: Secondary | ICD-10-CM | POA: Diagnosis not present

## 2019-01-29 DIAGNOSIS — M542 Cervicalgia: Secondary | ICD-10-CM | POA: Diagnosis not present

## 2019-01-29 DIAGNOSIS — G43719 Chronic migraine without aura, intractable, without status migrainosus: Secondary | ICD-10-CM | POA: Diagnosis not present

## 2019-01-31 NOTE — Unmapped (Signed)
[facility] Refill Coordination Note    Specialty Medication(s) to be Shipped:   Inflammatory Disorders: Benlysta    Other medication(s) to be shipped: n/a     Tonya Boyer, DOB: 1965-05-18  Phone: [phone] (home)       All above HIPAA information was verified with patient.     Completed refill call assessment today to schedule patient's medication shipment from the [facility] [phone]).       Specialty medication(s) and dose(s) confirmed: Regimen is correct and unchanged.   Changes to medications: Kellina reports no changes at this time.  Changes to insurance: No  Questions for the pharmacist: No    Confirmed patient received Welcome Packet with first shipment. The patient will receive a drug information handout for each medication shipped and additional FDA Medication Guides as required.       DISEASE/MEDICATION-SPECIFIC INFORMATION        For patients on injectable medications: Patient currently has 1 doses left.  Next injection is scheduled for 02/01/19.    SPECIALTY MEDICATION ADHERENCE     Medication Adherence    Patient reported X missed doses in the last month: 0  Specialty Medication: Benlysta  Patient is on additional specialty medications: No  Patient is on more than two specialty medications: No  Any gaps in refill history greater than 2 weeks in the last 3 months: no  Demonstrates understanding of importance of adherence: yes  Informant: patient                Benlysta 200mg /ml: Patient has 7 days of medication on hand      SHIPPING     Shipping address confirmed in Epic.     Delivery Scheduled: Yes, Expected medication delivery date: 02/06/19.     Medication will be delivered via UPS to the prescription address in Epic W[facility].    [name]   [facility] Specialty Technician

## 2019-02-05 MED FILL — BENLYSTA 200 MG/ML SUBCUTANEOUS SYRINGE: 28 days supply | Qty: 4 | Fill #8 | Status: AC

## 2019-02-05 MED FILL — BENLYSTA 200 MG/ML SUBCUTANEOUS SYRINGE: 28 days supply | Qty: 4 | Fill #8

## 2019-02-18 ENCOUNTER — Other Ambulatory Visit: Payer: Self-pay | Admitting: Internal Medicine

## 2019-02-18 DIAGNOSIS — M3500 Sicca syndrome, unspecified: Secondary | ICD-10-CM | POA: Diagnosis not present

## 2019-02-18 DIAGNOSIS — E042 Nontoxic multinodular goiter: Secondary | ICD-10-CM | POA: Diagnosis not present

## 2019-02-18 DIAGNOSIS — R1310 Dysphagia, unspecified: Secondary | ICD-10-CM | POA: Diagnosis not present

## 2019-02-18 DIAGNOSIS — H5789 Other specified disorders of eye and adnexa: Secondary | ICD-10-CM | POA: Diagnosis not present

## 2019-02-18 DIAGNOSIS — R490 Dysphonia: Secondary | ICD-10-CM | POA: Diagnosis not present

## 2019-02-18 DIAGNOSIS — M329 Systemic lupus erythematosus, unspecified: Secondary | ICD-10-CM | POA: Diagnosis not present

## 2019-02-25 ENCOUNTER — Ambulatory Visit
Admission: RE | Admit: 2019-02-25 | Discharge: 2019-02-25 | Disposition: A | Discharge status: Home / Self Care | Payer: Medicare PPO | Source: Ambulatory Visit | Attending: Internal Medicine | Admitting: Internal Medicine

## 2019-02-25 DIAGNOSIS — E042 Nontoxic multinodular goiter: Secondary | ICD-10-CM

## 2019-02-25 DIAGNOSIS — E041 Nontoxic single thyroid nodule: Secondary | ICD-10-CM | POA: Diagnosis not present

## 2019-03-04 NOTE — Unmapped (Signed)
[facility] Clinical Assessment & Refill Coordination Note    Tonya Boyer, DOB: 08-20-1965  Phone: [phone] (home)     All above HIPAA information was verified with patient.     Was a Nurse, learning disability used for this call? No    Specialty Medication(s):   Inflammatory Disorders: Benlysta     Current Outpatient Medications   Medication Sig Dispense Refill   ??? belimumab 200 mg/mL Syrg INJECT THE CONTENTS OF 1 SYRINGE (200MG ) UNDER THE SKIN EVERY 7 DAYS 12 mL 3   ??? clotrimazole (LOTRIMIN) 1 % cream Apply topically Two (2) times a day.     ??? dexamethasone (DECADRON) 10 mg/mL injection Infuse into a venous catheter. Dose unknown, every 6-8 weeks. Injections. Given with lidocaine and marcaine.     ??? DULoxetine (CYMBALTA) 20 MG capsule Take 20 mg by mouth daily.  0   ??? empty container Misc use as directed 1 each PRN   ??? hydrOXYchloroQUINE (PLAQUENIL) 200 mg tablet Take 1 tablet (200 mg total) by mouth daily. 90 tablet 3   ??? naproxen sodium (ALEVE) 220 MG tablet Take 220 mg by mouth continuous as needed for pain.     ??? triamcinolone (KENALOG) 0.1 % cream        No current facility-administered medications for this visit.         Changes to medications: Skylen reports no changes at this time.    Allergies   Allergen Reactions   ??? Amoxicillin Hives   ??? Penicillins Hives   ??? Latex Other (See Comments)     Skin irritation   ??? Penicillin G    ??? Prednisone    ??? Latex, Natural Rubber Rash       Changes to allergies: No    SPECIALTY MEDICATION ADHERENCE     Benlysta 200mg /ml: 0 days of medicine on hand     Medication Adherence    Patient reported X missed doses in the last month: 0  Specialty Medication: Benlysta 200mg /ml          Specialty medication(s) dose(s) confirmed: Regimen is correct and unchanged.     Are there any concerns with adherence? No    Adherence counseling provided? Not needed    CLINICAL MANAGEMENT AND INTERVENTION      Clinical Benefit Assessment: Do you feel the medicine is effective or helping your condition? Yes    Clinical Benefit counseling provided? Not needed    Adverse Effects Assessment:    Are you experiencing any side effects? No    Are you experiencing difficulty administering your medicine? No    Quality of Life Assessment:    How many days over the past month did your SLE keep you from your normal activities? For example, brushing your teeth or getting up in the morning. Patient declined to answer    Have you discussed this with your provider? Not needed    Therapy Appropriateness:    Is therapy appropriate? Pharmacist will consult provider    DISEASE/MEDICATION-SPECIFIC INFORMATION      For patients on injectable medications: Patient currently has 0 doses left.  Next injection is scheduled for 03/08/2019.    PATIENT SPECIFIC NEEDS     ? Does the patient have any physical, cognitive, or cultural barriers? No    ? Is the patient high risk? No     ? Does the patient require a Care Management Plan? No     ? Does the patient require physician intervention or  other additional services (i.e. nutrition, smoking cessation, social work)? No      SHIPPING     Specialty Medication(s) to be Shipped:   Inflammatory Disorders: Benlysta 200mg /ml    Other medication(s) to be shipped: none       Changes to insurance: No    Delivery Scheduled: Yes, Expected medication delivery date: 03/06/2019.     Medication will be delivered via UPS to the confirmed prescription address in [facility].    The patient will receive a drug information handout for each medication shipped and additional FDA Medication Guides as required.  Verified that patient has previously received a Conservation officer, historic buildings.    All of the patient's questions and concerns have been addressed.    Karene Fry Akyra Bouchie   [facility] Specialty Pharmacist

## 2019-03-05 MED FILL — BENLYSTA 200 MG/ML SUBCUTANEOUS SYRINGE: 28 days supply | Qty: 4 | Fill #9

## 2019-03-05 MED FILL — BENLYSTA 200 MG/ML SUBCUTANEOUS SYRINGE: 28 days supply | Qty: 4 | Fill #9 | Status: AC

## 2019-03-12 ENCOUNTER — Ambulatory Visit (INDEPENDENT_AMBULATORY_CARE_PROVIDER_SITE_OTHER): Payer: Medicare PPO | Admitting: Otolaryngology

## 2019-03-12 ENCOUNTER — Other Ambulatory Visit: Payer: Self-pay

## 2019-03-12 ENCOUNTER — Encounter (INDEPENDENT_AMBULATORY_CARE_PROVIDER_SITE_OTHER): Payer: Self-pay | Admitting: Otolaryngology

## 2019-03-12 VITALS — Temp 98.1°F

## 2019-03-12 DIAGNOSIS — M542 Cervicalgia: Secondary | ICD-10-CM | POA: Diagnosis not present

## 2019-03-12 DIAGNOSIS — G43719 Chronic migraine without aura, intractable, without status migrainosus: Secondary | ICD-10-CM | POA: Diagnosis not present

## 2019-03-12 DIAGNOSIS — M791 Myalgia, unspecified site: Secondary | ICD-10-CM | POA: Diagnosis not present

## 2019-03-12 DIAGNOSIS — L723 Sebaceous cyst: Secondary | ICD-10-CM

## 2019-03-12 DIAGNOSIS — G518 Other disorders of facial nerve: Secondary | ICD-10-CM | POA: Diagnosis not present

## 2019-03-12 DIAGNOSIS — D140 Benign neoplasm of middle ear, nasal cavity and accessory sinuses: Secondary | ICD-10-CM | POA: Diagnosis not present

## 2019-03-12 NOTE — Progress Notes (Signed)
HPI: Dawn Marsh is a 53 y.o. female who presents for evaluation of of a recent nodule she noticed in the left nostril.  She also inquires about a cyst in the posterior right neck that has been previously excised.  The nodule in her nose was protruding more but recently fell off.  She has had no real pain associated with it.  She still feels some firmness in the area where the nodule was located when she palpates with her finger.  She wanted this checked.  She also had a cyst removed from her posterior right neck that has recurred.  Past Medical History:  Diagnosis Date  . Anemia of other chronic disease 12/27/2012  . Anxiety   . Chest pain    constant-ongoing for years--sometimes eases up--but always comes back  . GERD (gastroesophageal reflux disease)    burning in stomach and gas--no med  . Goiter    sometimes trouble swallowing  . Heart murmur   . 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 Hypertension   . Left renal mass   . Leukopenia 12/27/2012  . Lupus ([place])   . Migraine   . Nontoxic simple goitre 12/27/2012  . Rhinitis, allergic    seasonal  . Seizures ([facility])    migraines  . SLE (systemic lupus erythematosus) ([place]) 12/27/2012  . Vertigo    Past Surgical History:  Procedure Laterality Date  . ABDOMINAL HYSTERECTOMY    . ABDOMINAL HYSTERECTOMY  2011  . IR RADIOLOGIST EVAL & MGMT  01/12/2017  . IR RADIOLOGIST EVAL & MGMT  02/07/2017  . IR RADIOLOGIST EVAL & MGMT  12/07/2017  . IR RADIOLOGIST EVAL & MGMT  12/26/2018  . KIDNEY SURGERY    . MYOMECTOMY ABDOMINAL APPROACH    . myomectomy x 2 (prior to the hysterectomy)  2002 and 2006  . TUMOR REMOVAL     Social History   Socioeconomic History  . Marital status: Divorced    Spouse name: Not on file  . Number of children: Not on file  . Years of education: Not on file  . Highest education level: Not on file  Occupational History  . Not on file  Tobacco Use  . Smoking status: Never Smoker  . Smokeless tobacco: Never Used   Substance and Sexual Activity  . Alcohol use: Yes    Comment: occas-maybe once a month  . Drug use: No  . Sexual activity: Not on file  Other Topics Concern  . Not on file  Social History Narrative   ** Merged History Encounter **       Social Determinants of Health   Financial Resource Strain:   . Difficulty of Paying Living Expenses: Not on file  Food Insecurity:   . Worried About Charity fundraiser in the Last Year: Not on file  . Ran Out of Food in the Last Year: Not on file  Transportation Needs:   . Lack of Transportation (Medical): Not on file  . Lack of Transportation (Non-Medical): Not on file  Physical Activity:   . Days of Exercise per Week: Not on file  . Minutes of Exercise per Session: Not on file  Stress:   . Feeling of Stress : Not on file  Social Connections:   . Frequency of Communication with Friends and Family: Not on file  . Frequency of Social Gatherings with Friends and Family: Not on file  . Attends Religious Services: Not on file  . Active Member of Clubs or Organizations: Not on file  .  Attends Archivist Meetings: Not on file  . Marital Status: Not on file   No family history on file. Allergies  Allergen Reactions  . Amoxicillin     Hives,diarrhea  . Amoxil [Amoxicillin Trihydrate] Hives and Diarrhea  . Penicillins Hives  . Penicillins     Hives,diarrhea  . Latex Rash   Prior to Admission medications   Medication Sig Start Date End Date Taking? Authorizing Provider  belimumab (BENLYSTA) 120 MG SOLR injection Inject 1 mL into the vein once a week. 200 mg weekly   Yes [provider]  dexamethasone (DECADRON) 10 MG/ML injection Inject 4 mLs into the vein every 6 (six) weeks.    Yes [provider]  DULoxetine (CYMBALTA) 20 MG capsule Take 20 mg by mouth daily. 05/05/16  Yes [provider]  hydroxychloroquine (PLAQUENIL) 200 MG tablet Take 200 mg by mouth daily. 09/18/18  Yes [provider]   naproxen sodium (ALEVE) 220 MG tablet Take 220 mg by mouth daily as needed.   Yes [provider]  triamcinolone cream (KENALOG) 0.1 % Apply 1 application topically daily as needed. 08/20/18  Yes [provider]     Positive ROS: Otherwise negative  All other systems have been reviewed and were otherwise negative with the exception of those mentioned in the HPI and as above.  Physical Exam: Constitutional: Alert, well-appearing, no acute distress Ears: External ears without lesions or tenderness. Ear canals are clear bilaterally with intact, clear TMs.  Nasal: External nose without lesions. Septum relatively midline.  She has a small nodule on the mucous membrane on the superior aspect of the right nasal septum at the caudal end that is slightly indurated.  There is no ulceration the area of induration is 2 to 3 mm in size.  There are really no mucosal changes and this is consistent with probable papilloma or fibroma.  Does not appear malignant.  Remaining nasal passageway is normal. Oral: Lips and gums without lesions. Tongue and palate mucosa without lesions. Posterior oropharynx clear. Neck: No palpable adenopathy or masses.  Posterior neck on the right side reveals a small cyst and a small keloid where this had previously been excised.  Is not that swollen and is not painful to palpation.  Causing her minimal symptoms. Respiratory: Breathing comfortably  Skin: No facial/neck lesions or rash noted.  Procedures  Assessment: Posterior right neck sebaceous cyst Left nostril papilloma or fibroma  Plan: Concerning the left nostril lesion did not recommend any specific therapy at this point outside of using antibiotic ointment.  If it enlarges again she will follow-up here for excision under local anesthetic in the office. Concerning the right posterior neck cyst would recommend this being performed under local as an outpatient but would wait until it is causing more problems  or during the summer months.  Radene Journey, MD

## 2019-03-19 DIAGNOSIS — E042 Nontoxic multinodular goiter: Secondary | ICD-10-CM | POA: Diagnosis not present

## 2019-03-19 DIAGNOSIS — R1310 Dysphagia, unspecified: Secondary | ICD-10-CM | POA: Diagnosis not present

## 2019-03-19 DIAGNOSIS — M329 Systemic lupus erythematosus, unspecified: Secondary | ICD-10-CM | POA: Diagnosis not present

## 2019-03-19 DIAGNOSIS — M3500 Sicca syndrome, unspecified: Secondary | ICD-10-CM | POA: Diagnosis not present

## 2019-03-26 NOTE — Unmapped (Signed)
[facility] Specialty Pharmacy Refill Coordination Note    Specialty Medication(s) to be Shipped:   Inflammatory Disorders: Benlysta    Other medication(s) to be shipped: n/a     Tonya Boyer, DOB: 1965/10/14  Phone: [phone] (home)       All above HIPAA information was verified with patient.     Was a Nurse, learning disability used for this call? No    Completed refill call assessment today to schedule patient's medication shipment from the [facility] [phone]).       Specialty medication(s) and dose(s) confirmed: Regimen is correct and unchanged.   Changes to medications: [name] reports no changes at this time.  Changes to insurance: No  Questions for the pharmacist: No    Confirmed patient received Welcome Packet with first shipment. The patient will receive a drug information handout for each medication shipped and additional FDA Medication Guides as required.       DISEASE/MEDICATION-SPECIFIC INFORMATION        For patients on injectable medications: Patient currently has 1 doses left.  Next injection is scheduled for 03/29/19.    SPECIALTY MEDICATION ADHERENCE     Medication Adherence    Patient reported X missed doses in the last month: 0  Specialty Medication: Benlysta  Patient is on additional specialty medications: No  Patient is on more than two specialty medications: No  Any gaps in refill history greater than 2 weeks in the last 3 months: no  Demonstrates understanding of importance of adherence: yes  Informant: patient                Benlysta 200mg /ml: Patient has 7 days of medication on hand      SHIPPING     Shipping address confirmed in Epic.     Delivery Scheduled: Yes, Expected medication delivery date: 04/03/19.     Medication will be delivered via UPS to the prescription address in Epic WAM.    [name]   [facility] Specialty Technician

## 2019-04-02 DIAGNOSIS — M329 Systemic lupus erythematosus, unspecified: Principal | ICD-10-CM

## 2019-04-03 MED FILL — BENLYSTA 200 MG/ML SUBCUTANEOUS SYRINGE: 28 days supply | Qty: 4 | Fill #10 | Status: AC

## 2019-04-03 MED FILL — BENLYSTA 200 MG/ML SUBCUTANEOUS SYRINGE: 28 days supply | Qty: 4 | Fill #10

## 2019-04-03 NOTE — Unmapped (Signed)
Tonya Boyer 's Benlysta shipment will be delayed as a result of prior authorization now approved.     I have reached out to the patient and left a voicemail message.  We will wait for a call back from the patient to reschedule the delivery.  We have not confirmed the new delivery date.

## 2019-04-03 NOTE — Unmapped (Signed)
Tonya Boyer 's Benlysta shipment will be delayed due to Prior Authorization Required We have contacted the patient and communicated the delivery change to patient/caregiver We will reschedule the medication for the delivery date that the patient agreed upon. We have confirmed the delivery date as 04/04/19 .

## 2019-04-17 DIAGNOSIS — M329 Systemic lupus erythematosus, unspecified: Principal | ICD-10-CM

## 2019-04-17 MED ORDER — HYDROXYCHLOROQUINE 200 MG TABLET
ORAL_TABLET | Freq: Every day | ORAL | 3 refills | 90.00000 days | Status: CP
Start: 2019-04-17 — End: ?

## 2019-04-17 NOTE — Unmapped (Signed)
Hydroxychloroquine refill  Last ov: 09/18/2018  Next ov: 05/21/2019

## 2019-04-23 DIAGNOSIS — M791 Myalgia, unspecified site: Secondary | ICD-10-CM | POA: Diagnosis not present

## 2019-04-23 DIAGNOSIS — G518 Other disorders of facial nerve: Secondary | ICD-10-CM | POA: Diagnosis not present

## 2019-04-23 DIAGNOSIS — G43719 Chronic migraine without aura, intractable, without status migrainosus: Secondary | ICD-10-CM | POA: Diagnosis not present

## 2019-04-23 DIAGNOSIS — M542 Cervicalgia: Secondary | ICD-10-CM | POA: Diagnosis not present

## 2019-04-24 NOTE — Unmapped (Signed)
[facility] Pharm[facility] Refill Coordination Note    Specialty Medication(s) to be Shipped:   Inflammatory Disorders: Benlysta    Other medication(s) to be shipped: sharps container     Tonya Boyer, DOB: 06/08/1965  Phone: [phone] (home)       All above HIPAA information was verified with patient.     Was a Nurse, learning disability used for this call? No    Completed refill call assessment today to schedule patient's medication shipment from the [facility] [phone]).       Specialty medication(s) and dose(s) confirmed: Regimen is correct and unchanged.   Changes to medications: [name] reports no changes at this time.  Changes to insurance: No  Questions for the pharmacist: No    Confirmed patient received Welcome Packet with first shipment. The patient will receive a drug information handout for each medication shipped and additional FDA Medication Guides as required.       DISEASE/MEDICATION-SPECIFIC INFORMATION        For patients on injectable medications: Patient currently has 1 doses left.  Next injection is scheduled for 04/26/2019.    SPECIALTY MEDICATION ADHERENCE     Medication Adherence    Patient reported X missed doses in the last month: 0  Specialty Medication: Benlysta  Patient is on additional specialty medications: No  Any gaps in refill history greater than 2 weeks in the last 3 months: no  Demonstrates understanding of importance of adherence: yes  Informant: patient  Reliability of informant: reliable  Confirmed plan for next specialty medication refill: delivery by pharmacy  Refills needed for supportive medications: not needed                Benlysta. 7 days on hand      SHIPPING     Shipping address confirmed in Epic.     Delivery Scheduled: Yes, Expected medication delivery date: 04/30/2019.     Medication will be delivered via UPS to the prescription address in Epic WAM.    [name] D Hrithik Boschee   [facility] Specialty Technician

## 2019-04-29 NOTE — Unmapped (Signed)
Patient called in today and she is requesting to change to her delivery date to 05/03/19.

## 2019-05-02 DIAGNOSIS — M329 Systemic lupus erythematosus, unspecified: Principal | ICD-10-CM

## 2019-05-02 NOTE — Unmapped (Signed)
Tonya Boyer 's BENLYSTA shipment will be canceled  as a result of a high copay.     I have reached out to the patient and communicated the delivery change. We will not reschedule the medication due to patient not wanting the medication due to cost ONLY! There is no financial assistance available that she would qualify for.   and have removed this/these medication(s) from the work request.  We have canceled this work request.

## 2019-05-02 NOTE — Unmapped (Signed)
Arial Falcon 's Benlysta shipment will be delayed as a result of a high copay.     I have reached out to the patient and communicated the delay. We will call the patient back to reschedule the delivery upon resolution. We have not confirmed the new delivery date.

## 2019-05-03 NOTE — Unmapped (Signed)
[facility] RHEUMATOLOGY CLINIC - PHARMACIST NOTES    Received voicemail from patient as she is unable to receive Benlysta @ [facility] and is unclear on the status.  Called patient back and left a VM.     Patient was getting Benlysta @ [facility] via assistance from the [employer].  The fund expired and unable to renew at this time due to lack of funding.  MAP team is working to obtain mfg assistance for [employer].     Chelsea Aus

## 2019-05-09 MED FILL — BENLYSTA 200 MG/ML SUBCUTANEOUS SYRINGE: 28 days supply | Qty: 4 | Fill #11

## 2019-05-09 MED FILL — BENLYSTA 200 MG/ML SUBCUTANEOUS SYRINGE: 28 days supply | Qty: 4 | Fill #11 | Status: AC

## 2019-05-09 NOTE — Unmapped (Addendum)
[facility] Special[facility] Pharmacy Refill Coordination Note    Specialty Medication(s) to be Shipped:   Inflammatory Disorders: Benlysta    Other medication(s) to be shipped: N/A     Tonya Boyer, DOB: Sep 30, 1965  Phone: [phone] (home)       All above HIPAA information was verified with patient.     Was a Nurse, learning disability used for this call? No    Completed refill call assessment today to schedule patient's medication shipment from the [facility] [phone]).       Specialty medication(s) and dose(s) confirmed: Regimen is correct and unchanged.   Changes to medications: Aggie reports no changes at this time.  Changes to insurance: No  Questions for the pharmacist: Yes: Yes about the copay and how to pay her bill - Ms. Kuriakose will pay her high copay one time at [facility] to meet the out of pocket requirements of the manufacturer support program and then plans to continue her application with that program to cover all future fills of her medication this year.    Confirmed patient received Conservation officer, historic buildings with first shipment. The patient will receive a drug information handout for each medication shipped and additional FDA Medication Guides as required.       DISEASE/MEDICATION-SPECIFIC INFORMATION        N/A    SPECIALTY MEDICATION ADHERENCE     Medication Adherence    Patient reported X missed doses in the last month: 0  Specialty Medication: Benlysta  Patient is on additional specialty medications: No                Benlysta 200 mg/ml: 0 days of medicine on hand       SHIPPING     Shipping address confirmed in Epic.     Delivery Scheduled: Yes, Expected medication delivery date: 05/10/19.     Medication will be delivered via UPS to the prescription address in Epic WAM.    Nancy Nordmann [facility] Specialty Technician

## 2019-05-15 DIAGNOSIS — M329 Systemic lupus erythematosus, unspecified: Principal | ICD-10-CM

## 2019-05-15 NOTE — Unmapped (Signed)
Ordered labs to [facility]. See MyChart message.

## 2019-05-16 DIAGNOSIS — M329 Systemic lupus erythematosus, unspecified: Secondary | ICD-10-CM | POA: Diagnosis not present

## 2019-05-16 NOTE — Unmapped (Signed)
Reason for call:     Eston Mould has made 3 attempts to contact pt regarding requesting information. At this time they will have to discontinue processing review.     [phone]    Last ov: Visit date not found  Next ov: Visit date not found

## 2019-05-19 LAB — URINALYSIS WITH CULTURE REFLEX
BILIRUBIN UA: NEGATIVE
BLOOD UA: NEGATIVE
GLUCOSE UA: NEGATIVE
KETONES UA: NEGATIVE
NITRITE UA: NEGATIVE
PH UA: 5.5 (ref 5.0–7.5)
PROTEIN UA: NEGATIVE
SPECIFIC GRAVITY UA: 1.014 (ref 1.005–1.030)

## 2019-05-19 LAB — MICROSCOPIC EXAMINATION: CASTS: NONE SEEN /LPF

## 2019-05-19 LAB — C4 COMPLEMENT: COMPLEMENT C4, SERUM: 23 mg/dL (ref 12–38)

## 2019-05-19 LAB — BACTERIA

## 2019-05-19 LAB — SPECIFIC GRAVITY UA: Specific gravity:Rden:Pt:Urine:Qn:: 1.014

## 2019-05-19 LAB — COMPREHENSIVE METABOLIC PANEL
A/G RATIO: 1.7 (ref 1.2–2.2)
ALBUMIN: 4.5 g/dL (ref 3.8–4.9)
ALKALINE PHOSPHATASE: 173 IU/L — ABNORMAL HIGH (ref 39–117)
ALT (SGPT): 16 IU/L (ref 0–32)
AST (SGOT): 22 IU/L (ref 0–40)
BILIRUBIN TOTAL: 0.3 mg/dL (ref 0.0–1.2)
BLOOD UREA NITROGEN: 13 mg/dL (ref 6–24)
CALCIUM: 9.8 mg/dL (ref 8.7–10.2)
CHLORIDE: 105 mmol/L (ref 96–106)
CREATININE: 0.98 mg/dL (ref 0.57–1.00)
GFR MDRD AF AMER: 77 mL/min/{1.73_m2}
GFR MDRD NON AF AMER: 67 mL/min/{1.73_m2}
GLOBULIN, TOTAL: 2.7 g/dL (ref 1.5–4.5)
POTASSIUM: 3.8 mmol/L (ref 3.5–5.2)
SODIUM: 142 mmol/L (ref 134–144)
TOTAL PROTEIN: 7.2 g/dL (ref 6.0–8.5)

## 2019-05-19 LAB — CBC W/ DIFFERENTIAL
BANDED NEUTROPHILS ABSOLUTE COUNT: 0 10*3/uL (ref 0.0–0.1)
BASOPHILS ABSOLUTE COUNT: 0.1 10*3/uL (ref 0.0–0.2)
BASOPHILS RELATIVE PERCENT: 1 %
EOSINOPHILS ABSOLUTE COUNT: 0.1 10*3/uL (ref 0.0–0.4)
EOSINOPHILS RELATIVE PERCENT: 3 %
HEMATOCRIT: 37.9 % (ref 34.0–46.6)
HEMOGLOBIN: 12.5 g/dL (ref 11.1–15.9)
IMMATURE GRANULOCYTES: 0 %
LYMPHOCYTES ABSOLUTE COUNT: 1.7 10*3/uL (ref 0.7–3.1)
LYMPHOCYTES RELATIVE PERCENT: 49 %
MEAN CORPUSCULAR HEMOGLOBIN CONC: 33 g/dL (ref 31.5–35.7)
MEAN CORPUSCULAR HEMOGLOBIN: 31.5 pg (ref 26.6–33.0)
MONOCYTES RELATIVE PERCENT: 14 %
NEUTROPHILS ABSOLUTE COUNT: 1.2 10*3/uL — ABNORMAL LOW (ref 1.4–7.0)
NEUTROPHILS RELATIVE PERCENT: 33 %
PLATELET COUNT: 296 10*3/uL (ref 150–450)
RED BLOOD CELL COUNT: 3.97 x10E6/uL (ref 3.77–5.28)
RED CELL DISTRIBUTION WIDTH: 12 % (ref 11.7–15.4)

## 2019-05-19 LAB — ALBUMIN: Albumin:MCnc:Pt:Ser/Plas:Qn:: 4.5

## 2019-05-19 LAB — CREATININE URINE: Creatinine:MCnc:Pt:Urine:Qn:: 89.5

## 2019-05-19 LAB — PROTEIN / CREATININE RATIO, URINE
CREATININE URINE: 89.5 mg/dL
PROTEIN URINE: 6.2 mg/dL

## 2019-05-19 LAB — COMPLEMENT C3, SERUM: Complement C3:MCnc:Pt:Ser/Plas:Qn:: 116

## 2019-05-19 LAB — COMPLEMENT C4, SERUM: Complement C4:MCnc:Pt:Ser/Plas:Qn:: 23

## 2019-05-19 LAB — DSDNA ANTIBODY: DNA double strand Ab:ACnc:Pt:Ser:Qn:: 2

## 2019-05-19 LAB — C3 COMPLEMENT: COMPLEMENT C3, SERUM: 116 mg/dL (ref 82–167)

## 2019-05-19 LAB — WHITE BLOOD CELL COUNT: Leukocytes:NCnc:Pt:Bld:Qn:Automated count: 3.5

## 2019-05-20 NOTE — Unmapped (Signed)
Results from your recent lab tests were normal or without significant abnormalities.     Please let us know if you have any questions.    Dr. Sullivan Lone

## 2019-05-21 ENCOUNTER — Telehealth: Admit: 2019-05-21 | Discharge: 2019-05-22 | Payer: MEDICARE

## 2019-05-21 DIAGNOSIS — M329 Systemic lupus erythematosus, unspecified: Secondary | ICD-10-CM | POA: Diagnosis not present

## 2019-05-21 MED ORDER — HYDROXYCHLOROQUINE 200 MG TABLET
ORAL_TABLET | Freq: Every day | ORAL | 3 refills | 90.00000 days | Status: CP
Start: 2019-05-21 — End: ?

## 2019-05-21 NOTE — Unmapped (Signed)
Rheumatology Remote Clinic Follow-up Appointment     Patient self identified using name and date of birth.    Patient location: Home.     Last clinic visit: July 2020     Reason for encounter: Follow-up systemic lupus erythematosus (SLE)     HISTORY: Tonya Boyer is a 54 y.o. female with a history of SLE (dx 2014) characerized by fatigue, leukopenia, arthralgias, pleurisy, reactive LAD, fatigue, brain fog, +ANA, +dsDNA, +Ro, +RNP, low titer aCL IgM who presents for follow-up visit of SLE. In brief summary, at time of diagnosis in 2014 the patient was started on MTX  with significant improvement in arthralgia, brain fog, fatigue but DC'd in 04/2014 2/2 mild elevation in LFTs and leukopenia (ANC <1),??leflunomide then tried without improvement in joint pain. G6PD markedly low, thus plaquenil not started due to concern for hemolytic anemia. Has not been able to tolerate steroids (even low dose) 2/2 anxiety. Trial of azathioprine started in 06/2014 with close monitoring of counts 2/2 low normal TPMT but DC'd 2/2 worsening leukopenia (WBC 2.4, ANC 1) in beginning of 11/2014. In 12/2014, the patient was started on belimumab. Of note, the patient has had persistently enlarged LAD - work up including PET CT with hypermetabolic LAD in neck, chest, pelvis. SLE vs. Lymphoma. Had bx 10/2014 by ENT of L neck node - reactive hyperplasia, no evidence of malignancy. Started plaquenil Jan 2020.  ??  In 07/2015, the patient was seen by Dr. Janee Morn for neurocognitive testing which revealed mild impairment consistent with a patient with SLE and was felt that fatigue was playing a role in her reduced cognitive efficiency. Recommended going to counseling as well due to adjustment to disability and depression/anxiety. TTE 07/2015 also normal. Sent for MRI brain/MRA head and neck which were normal 10/2015.     Interval History:  - Sent photos last week of her skin on her arms starting 4-5 inches below shoulder down to elbow bilat. Sometimes also on her legs. She has always had this rash, some days worse than others. Seems like vessels are larger. Not worse with cold. Started 4 years ago. No pain or itching, just visual.   - She has had some sharp intermittent chest pain, better last few days. Worse with deep breath. Mostly on the L side, sometimes on R side. Sometimes lasts up to a week.  - A few weeks ago was having racing heart, now better.   - Now on plaquenil 200 mg daily which she feels helped with chest pain. Remains on Benlysta, just had last delivery last week.    - She is excited about seeing 73 year old nephew (who has autism) after she is vaccinated.     Objective    Physical Exam:  GENERAL: The patient is well appearing, in no acute distress.   SKIN: No rashes.   Respiratory: Breathing non-labored. No cough.   CV: Hands appear well perfused  VASCULAR: No c/c/e bilateral hands.  NEURO: CN 2-12 grossly intact.   PSYCH: No depression or anxiety. Cooperative. Alert and oriented.     ASSESSMENT/PLAN:  Tonya Boyer is a 54 y.o. female with a history of SLE (dx 2014) characerized by fatigue, leukopenia, arthralgia, pleurisy, reactive LAD, brain fog, +ANA, +dsDNA, +Ro, +RNP, low titer aCL IgM currently on belimumab SQ weekly who presents for follow up.  ??  SLE: currently on belimumab SQ and plaquenil 200 mg daily with overall stability in her symptoms including improvement in pleurisy, arthralgia, brain fog. No evidence of  inflammatory arthritis or rash today on limited video exam or per pt report.  Some pleurisy.   - Reviewed labs from last month from PCP. Did not include complements, dsDNA, or urine.  - Reviewed lupus monitoring labs from 05/16/2019 with pt today. (CBC, CMP, C3, C4, dsDNA, UA)  - Continue benlysta SQ weekly injection  - Will increase plaquenil to 300 mg daily.   ??  High risk medication monitoring:??Benlysta  - Patient is currently taking??Benlysta, an immunosuppressant medication that requires intensive monitoring. This monitoring was done today through history, physical, and/or lab testing.   - No recent infections or hospitalizations.   - Reviewed CBC and CMP from 05/16/2019.??  ??  Long-term plaquenil use: Patient is currently taking plaquenil which requires intensive monitoring including regular eye exams due to risk for retinal toxicity.               - Plaquenil started: Jan 2020   - Last eye exam was: Dr. [place] DeMarco OD [facility] 10/25/2018   - Patient has the following risk factors for retinal toxicity from plaquenil: None  - Reminded pt to schedule follow-up eye exam.   ??????  Dizziness and HA/brain fog/anxiety:??MRI brain and MRA head and neck normal Aug 2017. Neurocognitive testing in 07/2015 with mild impairment but likely multifactorial including SLE, fatigue, anxiety/depression. May be exacerbated by poor sleep.   - Not discussed today.   ??  Fibromalgia: Not discussed today.     Immunization Counseling:   Prevnar PCV-13: Oct 2019  Pneumovax PPSV-23: Sept 2016  Flu: Aug 2020  Covid-19: No contraindication from rheumatology perspective, pt should be Group 4 due to her history of SLE    Follow-up: 9 months in person    Danella Maiers, MD, [facility]  Assistant Professor of Medicine  Department of Medicine/Division of Rheumatology  [place] [facility] [place] [facility]  [phone] clinic phone  [phone] clinic secure fax     I spent 22 minutes on the real-time audio and video visit with the patient on the date of service. I spent an additional 6 minutes on pre- and post-visit activities on the date of service.     The patient was not located and I was not located within 250 yards of a hospital based location during the real-time audio and video visit. The patient was physically located in [place] or a state in which I am permitted to provide care. The patient and/or parent/guardian understood that s/he may incur co-pays and cost sharing, and agreed to the telemedicine visit. The visit was reasonable and appropriate under the circumstances given the patient's presentation at the time.    The patient and/or parent/guardian has been advised of the potential risks and limitations of this mode of treatment (including, but not limited to, the absence of in-person examination) and has agreed to be treated using telemedicine. The patient's/patient's family's questions regarding telemedicine have been answered.    If the visit was completed in an ambulatory setting, the patient and/or parent/guardian has also been advised to contact their provider???s office for worsening conditions, and seek emergency medical treatment and/or call 911 if the patient deems either necessary.

## 2019-05-28 ENCOUNTER — Other Ambulatory Visit: Payer: Self-pay | Admitting: Interventional Radiology

## 2019-05-28 DIAGNOSIS — D1771 Benign lipomatous neoplasm of kidney: Secondary | ICD-10-CM

## 2019-05-28 NOTE — Unmapped (Signed)
Received fax from [facility] stating:     rx for hydroxychloroquine 200mg  tab with directions to cut this tablet. This medication is not designed to be split and cutting may alter it's release. Please clarify directions  & Strength     Please advise.

## 2019-05-30 DIAGNOSIS — M329 Systemic lupus erythematosus, unspecified: Principal | ICD-10-CM

## 2019-05-30 MED ORDER — EMPTY CONTAINER
2 refills | 0 days
Start: 2019-05-30 — End: ?

## 2019-05-30 MED ORDER — BENLYSTA 200 MG/ML SUBCUTANEOUS SYRINGE
SUBCUTANEOUS | 3 refills | 0.00000 days | Status: CP
Start: 2019-05-30 — End: 2020-05-29
  Filled 2019-06-04: qty 4, 28d supply, fill #0

## 2019-05-30 NOTE — Unmapped (Signed)
[facility] Clinical Assessment & Refill Coordination Note    Tonya Boyer, DOB: 12/24/1965  Phone: [phone] (home)     All above HIPAA information was verified with patient.     Was a Nurse, learning disability used for this call? No    Specialty Medication(s):   Inflammatory Disorders: Benlysta     Current Outpatient Medications   Medication Sig Dispense Refill   ??? belimumab (BENLYSTA) 200 mg/mL Syrg Inject the contents of 1 syringe (200mg ) under the skin once every 7 days. 12 mL 3   ??? dexamethasone (DECADRON) 10 mg/mL injection Infuse into a venous catheter. Dose unknown, every 6-8 weeks. Injections. Given with lidocaine and marcaine.     ??? DULoxetine (CYMBALTA) 20 MG capsule Take 20 mg by mouth daily.  0   ??? empty container Misc Use as directed 1 each 2   ??? hydrOXYchloroQUINE (PLAQUENIL) 200 mg tablet Take 1.5 tablets (300 mg total) by mouth daily. 135 tablet 3   ??? naproxen sodium (ALEVE) 220 MG tablet Take 220 mg by mouth continuous as needed for pain.       No current facility-administered medications for this visit.         Changes to medications: Tonya Boyer reports no changes at this time.    Allergies   Allergen Reactions   ??? Amoxicillin Hives   ??? Penicillins Hives   ??? Latex Other (See Comments)     Skin irritation   ??? Penicillin G    ??? Prednisone    ??? Latex, Natural Rubber Rash       Changes to allergies: No    SPECIALTY MEDICATION ADHERENCE     Benlysta 200mg /ml: 7 days of medicine on hand     Medication Adherence    Patient reported X missed doses in the last month: 0  Specialty Medication: Benlysta 200mg /ml          Specialty medication(s) dose(s) confirmed: Regimen is correct and unchanged.     Are there any concerns with adherence? No    Adherence counseling provided? Not needed    CLINICAL MANAGEMENT AND INTERVENTION      Clinical Benefit Assessment:    Do you feel the medicine is effective or helping your condition? Yes    Clinical Benefit counseling provided? Not needed Adverse Effects Assessment:    Are you experiencing any side effects? No    Are you experiencing difficulty administering your medicine? No    Quality of Life Assessment:    Rheumatology:   Quality of Life    On a scale of 1 ??? 10 with 1 representing not at all and 10 representing completely ??? how has your rheumatologic condition affected your:  Daily pain level?: decline to answer  Ability to complete your regular daily tasks (prepare meals, get dressed, etc.)?: decline to answer  Ability to participate in social or family activities?: decline to answer         Have you discussed this with your provider? Not needed    Therapy Appropriateness:    Is therapy appropriate? Yes, therapy is appropriate and should be continued    DISEASE/MEDICATION-SPECIFIC INFORMATION      For patients on injectable medications: Patient currently has 1 doses left.  Next injection is scheduled for 05/31/2019.    PATIENT SPECIFIC NEEDS     ? Does the patient have any physical, cognitive, or cultural barriers? No    ? Is the patient high risk? No     ?  Does the patient require a Care Management Plan? No     ? Does the patient require physician intervention or other additional services (i.e. nutrition, smoking cessation, social work)? No      SHIPPING     Specialty Medication(s) to be Shipped:   Inflammatory Disorders: Benlysta 200mg /ml    Other medication(s) to be shipped: sharps kit       Changes to insurance: No    Delivery Scheduled: Yes, Expected medication delivery date: 06/05/2019.     Medication will be delivered via UPS to the confirmed prescription address in [facility].    The patient will receive a drug information handout for each medication shipped and additional FDA Medication Guides as required.  Verified that patient has previously received a Conservation officer, historic buildings.    All of the patient's questions and concerns have been addressed.    Karene Fry Muath Hallam   [facility] Specialty Pharmacist

## 2019-06-03 NOTE — Unmapped (Signed)
Reason for call: Called Per Rx Refill Request to schedule F/U appt with Dr. Sullivan Lone. No answer left Vm.    Last ov: Visit date not found  Next ov: Visit date not found

## 2019-06-03 NOTE — Unmapped (Signed)
Pt scheduled  

## 2019-06-04 MED FILL — EMPTY CONTAINER: 120 days supply | Qty: 1 | Fill #0

## 2019-06-04 MED FILL — EMPTY CONTAINER: 120 days supply | Qty: 1 | Fill #0 | Status: AC

## 2019-06-04 MED FILL — BENLYSTA 200 MG/ML SUBCUTANEOUS SYRINGE: 28 days supply | Qty: 4 | Fill #0 | Status: AC

## 2019-06-05 DIAGNOSIS — R000 Tachycardia, unspecified: Secondary | ICD-10-CM | POA: Diagnosis not present

## 2019-06-05 DIAGNOSIS — Z6825 Body mass index (BMI) 25.0-25.9, adult: Secondary | ICD-10-CM | POA: Diagnosis not present

## 2019-06-05 DIAGNOSIS — E663 Overweight: Secondary | ICD-10-CM | POA: Diagnosis not present

## 2019-06-05 DIAGNOSIS — Z1389 Encounter for screening for other disorder: Secondary | ICD-10-CM | POA: Diagnosis not present

## 2019-06-06 ENCOUNTER — Ambulatory Visit: Payer: Medicare PPO

## 2019-06-07 ENCOUNTER — Ambulatory Visit: Payer: Medicare PPO | Attending: Internal Medicine

## 2019-06-07 DIAGNOSIS — Z23 Encounter for immunization: Secondary | ICD-10-CM

## 2019-06-07 NOTE — Progress Notes (Signed)
   Covid-19 Vaccination Clinic  Name:  Dawn Marsh    MRN: [id number] DOB: January 16, 1966  06/07/2019  [name] was observed post Covid-19 immunization for 15 minutes without incident. She was provided with Vaccine Information Sheet and instruction to access the V-Safe system.   [name] was instructed to call 911 with any severe reactions post vaccine: Marland Kitchen Difficulty breathing  . Swelling of face and throat  . A fast heartbeat  . A bad rash all over body  . Dizziness and weakness   Immunizations Administered    Name Date Dose VIS Date Route   Moderna COVID-19 Vaccine 06/07/2019  8:29 AM 0.5 mL 02/12/2019 Intramuscular   Manufacturer: Moderna   Lot: [id number]   [id number]

## 2019-06-18 DIAGNOSIS — M542 Cervicalgia: Secondary | ICD-10-CM | POA: Diagnosis not present

## 2019-06-18 DIAGNOSIS — K5904 Chronic idiopathic constipation: Secondary | ICD-10-CM | POA: Diagnosis not present

## 2019-06-18 DIAGNOSIS — R140 Abdominal distension (gaseous): Secondary | ICD-10-CM | POA: Diagnosis not present

## 2019-06-18 DIAGNOSIS — G43719 Chronic migraine without aura, intractable, without status migrainosus: Secondary | ICD-10-CM | POA: Diagnosis not present

## 2019-06-18 DIAGNOSIS — M791 Myalgia, unspecified site: Secondary | ICD-10-CM | POA: Diagnosis not present

## 2019-06-18 DIAGNOSIS — K219 Gastro-esophageal reflux disease without esophagitis: Secondary | ICD-10-CM | POA: Diagnosis not present

## 2019-06-18 DIAGNOSIS — Z1211 Encounter for screening for malignant neoplasm of colon: Secondary | ICD-10-CM | POA: Diagnosis not present

## 2019-06-18 DIAGNOSIS — G518 Other disorders of facial nerve: Secondary | ICD-10-CM | POA: Diagnosis not present

## 2019-06-21 ENCOUNTER — Ambulatory Visit (HOSPITAL_COMMUNITY)
Admission: RE | Admit: 2019-06-21 | Discharge: 2019-06-21 | Disposition: A | Discharge status: Home / Self Care | Payer: Medicare PPO | Source: Ambulatory Visit | Attending: Interventional Radiology | Admitting: Interventional Radiology

## 2019-06-21 ENCOUNTER — Other Ambulatory Visit: Payer: Self-pay

## 2019-06-21 DIAGNOSIS — D1771 Benign lipomatous neoplasm of kidney: Secondary | ICD-10-CM | POA: Insufficient documentation

## 2019-06-21 DIAGNOSIS — D3002 Benign neoplasm of left kidney: Secondary | ICD-10-CM | POA: Diagnosis not present

## 2019-06-21 LAB — POCT I-STAT CREATININE: Creatinine, Ser: 0.9 mg/dL (ref 0.44–1.00)

## 2019-06-21 MED ORDER — IOHEXOL 300 MG/ML  SOLN
100.0000 mL | Freq: Once | INTRAMUSCULAR | Status: AC | PRN
  Administered 2019-06-21: 08:00:00 100 mL via INTRAVENOUS

## 2019-06-25 ENCOUNTER — Ambulatory Visit
Admission: RE | Admit: 2019-06-25 | Discharge: 2019-06-25 | Disposition: A | Discharge status: Home / Self Care | Payer: Medicare PPO | Source: Ambulatory Visit | Attending: Interventional Radiology | Admitting: Interventional Radiology

## 2019-06-25 ENCOUNTER — Other Ambulatory Visit: Payer: Self-pay

## 2019-06-25 ENCOUNTER — Encounter: Payer: Self-pay | Admitting: *Deleted

## 2019-06-25 DIAGNOSIS — D1772 Benign lipomatous neoplasm of other genitourinary organ: Secondary | ICD-10-CM | POA: Diagnosis not present

## 2019-06-25 DIAGNOSIS — D1771 Benign lipomatous neoplasm of kidney: Secondary | ICD-10-CM

## 2019-06-25 HISTORY — PX: IR RADIOLOGIST EVAL & MGMT: IMG5224

## 2019-06-25 NOTE — Progress Notes (Signed)
Patient ID: Dawn Marsh, female   DOB: Aug 04, 1965, 54 y.o.   MRN: [id number]       Chief Complaint:  Left renal angiomyolipoma, outpatient surveillance  Referring Physician(s): McKenzie  History of Present Illness: Dawn Marsh is a 54 y.o. 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  She is followed for a left lower pole exophytic renal mass with macroscopic fat and enhancement compatible with a known renal angiomyolipoma.  She also has a chronic history of SLE and leukopenia.  She remains asymptomatic.  No flank pain, abdominal pain, fever, dysuria or hematuria.  No recent illness or fever.  She has had recent surveillance imaging to review today.  Interval CT from 06/21/2019 is compared to October 2020.  This demonstrates minimal enlargement of the left inferior pole angiomyolipoma, maximal diameter 3.8 cm, previously 3.4 cm.  No interval signs of intralesional hemorrhage or retroperitoneal hemorrhage.  The angiomyolipoma continues to have enhancing vascularity but no aneurysm associated with it.  Maximal dimension remains less than 4 cm.  Past Medical History:  Diagnosis Date  . Anemia of other chronic disease 12/27/2012  . Anxiety   . Chest pain    constant-ongoing for years--sometimes eases up--but always comes back  . GERD (gastroesophageal reflux disease)    burning in stomach and gas--no med  . Goiter    sometimes trouble swallowing  . Heart murmur   . 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 Hypertension   . Left renal mass   . Leukopenia 12/27/2012  . Lupus ([place])   . Migraine   . Nontoxic simple goitre 12/27/2012  . Rhinitis, allergic    seasonal  . Seizures (HCC)    migraines  . SLE (systemic lupus erythematosus) ([place]) 12/27/2012  . Vertigo     Past Surgical History:  Procedure Laterality Date  . ABDOMINAL HYSTERECTOMY    . ABDOMINAL HYSTERECTOMY  2011  . IR RADIOLOGIST EVAL & MGMT  01/12/2017    . IR RADIOLOGIST EVAL & MGMT  02/07/2017  . IR RADIOLOGIST EVAL & MGMT  12/07/2017  . IR RADIOLOGIST EVAL & MGMT  12/26/2018  . KIDNEY SURGERY    . MYOMECTOMY ABDOMINAL APPROACH    . myomectomy x 2 (prior to the hysterectomy)  2002 and 2006  . TUMOR REMOVAL      Allergies: Amoxicillin, Amoxil [amoxicillin trihydrate], Penicillins, Penicillins, and Latex  Medications: Prior to Admission medications   Medication Sig Start Date End Date Taking? Authorizing Provider  belimumab (BENLYSTA) 120 MG SOLR injection Inject 1 mL into the vein once a week. 200 mg weekly    [provider]  dexamethasone (DECADRON) 10 MG/ML injection Inject 4 mLs into the vein every 6 (six) weeks.     [provider]  DULoxetine (CYMBALTA) 20 MG capsule Take 20 mg by mouth daily. 05/05/16   [provider]  hydroxychloroquine (PLAQUENIL) 200 MG tablet Take 200 mg by mouth daily. 09/18/18   [provider]  naproxen sodium (ALEVE) 220 MG tablet Take 220 mg by mouth daily as needed.    [provider]  triamcinolone cream (KENALOG) 0.1 % Apply 1 application topically daily as needed. 08/20/18   [provider]     No family history on file.  Social History   Socioeconomic History  . Marital status: Divorced    Spouse name: Not on file  . Number of children: Not on file  . Years of education: Not on  file  . Highest education level: Not on file  Occupational History  . Not on file  Tobacco Use  . Smoking status: Never Smoker  . Smokeless tobacco: Never Used  Substance and Sexual Activity  . Alcohol use: Yes    Comment: occas-maybe once a month  . Drug use: No  . Sexual activity: Not on file  Other Topics Concern  . Not on file  Social History Narrative   ** Merged History Encounter **       Social Determinants of Health   Financial Resource Strain:   . Difficulty of Paying Living Expenses:   Food Insecurity:   . Worried About Charity fundraiser in  the Last Year:   . Arboriculturist in the Last Year:   Transportation Needs:   . Film/video editor (Medical):   Marland Kitchen Lack of Transportation (Non-Medical):   Physical Activity:   . Days of Exercise per Week:   . Minutes of Exercise per Session:   Stress:   . Feeling of Stress :   Social Connections:   . Frequency of Communication with Friends and Family:   . Frequency of Social Gatherings with Friends and Family:   . Attends Religious Services:   . Active Member of Clubs or Organizations:   . Attends Archivist Meetings:   Marland Kitchen Marital Status:      Review of Systems  Review of Systems: A 12 point ROS discussed and pertinent positives are indicated in the HPI above.  All other systems are negative.  Physical Exam No direct physical exam was performed, telephone health visit only today because of Covid pandemic Vital Signs: There were no vitals taken for this visit.  Imaging: CT ABDOMEN W WO CONTRAST  Result Date: 06/21/2019 CLINICAL DATA:  Renal angiomyolipoma.  Prior ablation 2013. EXAM: CT ABDOMEN WITHOUT AND WITH CONTRAST TECHNIQUE: Multidetector CT imaging of the abdomen was performed following the standard protocol before and following the bolus administration of intravenous contrast. CONTRAST:  152mL OMNIPAQUE IOHEXOL 300 MG/ML  SOLN COMPARISON:  None. FINDINGS: Lower chest:  Lung bases are clear. Hepatobiliary: No focal hepatic lesion gallbladder normal. Pancreas: Normal pancreatic parenchymal intensity. No ductal dilatation or inflammation. Spleen: Normal spleen. Adrenals/urinary tract: Exophytic from the lower pole of the LEFT kidney is again demonstrated. Lesion is macroscopic fat and septal enhancement. Lesion measuring 3.0 x 2.8 by 3.0 cm (volume = 13 cm^3) which compares with 2.7 x 2.2 x 3.0 cm (volume = 8.9 cm^3) on CT 12/24/18 remeasured. Cryoablation site in the mid LEFT kidney is again demonstrated without evidence of local recurrence. RIGHT kidney normal  Stomach/Bowel: Stomach and limited view of the small bowel is unremarkable. Vascular/Lymphatic: No retroperitoneal lymphadenopathy. Aorta normal. Musculoskeletal: No aggressive osseous lesion IMPRESSION: 1. Continued enlargement of the presumed LEFT adrenal angiomyolipoma. Recommend follow-up consultation with interventional radiology. 2. Stable LEFT partial nephrectomy site Electronically Signed   By: Suzy Bouchard M.D.   On: 06/21/2019 12:52    Labs:  CBC: Recent Labs    12/18/18 1201  WBC 3.0*  HGB 12.0  HCT 36.4  PLT 249    COAGS: No results for input(s): INR, APTT in the last 8760 hours.  BMP: Recent Labs    12/24/18 0733 06/21/19 0812  CREATININE 0.90 0.90    LIVER FUNCTION TESTS: No results for input(s): BILITOT, AST, ALT, ALKPHOS, PROT, ALBUMIN in the last 8760 hours.   Assessment and Plan:  Asymptomatic but continued very slow interval enlargement  of a left kidney lower pole exophytic angiomyolipoma previously measuring 3.4 cm now 3.8 cm in maximal dimension.  No CT signs of interval hemorrhage.  Lesion size remains just under 4 cm.  Plan: Continue close surveillance at every 6 months.  Consider elective embolization if the lesion becomes greater than 4 cm.  Next CT will be performed at [facility] in October 2021.  Thank you for this interesting consult.  I greatly enjoyed meeting Dawn Marsh and look forward to participating in their care.  A copy of this report was sent to the requesting provider on this date.  Electronically Signed: Greggory Keen 06/25/2019, 10:42 AM   I spent a total of    25 Minutes in remote  clinical consultation, greater than 50% of which was counseling/coordinating care for this patient with a asymptomatic left renal angiomyolipoma.    Visit type: Audio only (telephone). Audio (no video) only due to patient's lack of internet/smartphone capability. Alternative for in-person consultation at [facility] [place]  [address] [place], [place], [place]. This visit type was conducted due to national recommendations for restrictions regarding the COVID-19 Pandemic (e.g. social distancing).  This format is felt to be most appropriate for this patient at this time.  All issues noted in this document were discussed and addressed.

## 2019-07-02 NOTE — Unmapped (Signed)
[facility] Refill Coordination Note    Specialty Medication(s) to be Shipped:   Inflammatory Disorders: Benlysta    Other medication(s) to be shipped: n/a     [name], DOB: [birth year]  Phone: [phone] (home)       All above HIPAA information was verified with patient.     Was a Nurse, learning disability used for this call? No    Completed refill call assessment today to schedule patient's medication shipment from the [facility] [phone]).       Specialty medication(s) and dose(s) confirmed: Regimen is correct and unchanged.   Changes to medications: Ameli reports no changes at this time.  Changes to insurance: No  Questions for the pharmacist: No    Confirmed patient received Welcome Packet with first shipment. The patient will receive a drug information handout for each medication shipped and additional FDA Medication Guides as required.       DISEASE/MEDICATION-SPECIFIC INFORMATION        For patients on injectable medications: Patient currently has 0 doses left.  Next injection is scheduled for 07/05/19.    SPECIALTY MEDICATION ADHERENCE     Medication Adherence    Patient reported X missed doses in the last month: 0  Specialty Medication: Benlysta 200mg /ml  Patient is on additional specialty medications: No  Informant: patient                    SHIPPING     Shipping address confirmed in Epic.     Delivery Scheduled: Yes, Expected medication delivery date: 07/04/19.     Medication will be delivered via UPS to the prescription address in Epic WAM.    Jasper Loser   [facility] Specialty Technician

## 2019-07-03 MED FILL — BENLYSTA 200 MG/ML SUBCUTANEOUS SYRINGE: 28 days supply | Qty: 4 | Fill #1 | Status: AC

## 2019-07-03 MED FILL — BENLYSTA 200 MG/ML SUBCUTANEOUS SYRINGE: 28 days supply | Qty: 4 | Fill #1

## 2019-07-09 ENCOUNTER — Ambulatory Visit: Payer: Medicare PPO | Attending: Internal Medicine

## 2019-07-09 ENCOUNTER — Ambulatory Visit: Payer: Medicare PPO

## 2019-07-09 DIAGNOSIS — Z23 Encounter for immunization: Secondary | ICD-10-CM

## 2019-07-09 NOTE — Progress Notes (Signed)
   Covid-19 Vaccination Clinic  Name:  Dawn Marsh    MRN: [id number] DOB: [birth year]  07/09/2019  [name] was observed post Covid-19 immunization for 15 minutes without incident. She was provided with Vaccine Information Sheet and instruction to access the V-Safe system.   [name] was instructed to call 911 with any severe reactions post vaccine: Marland Kitchen Difficulty breathing  . Swelling of face and throat  . A fast heartbeat  . A bad rash all over body  . Dizziness and weakness   Immunizations Administered    Name Date Dose VIS Date Route   Moderna COVID-19 Vaccine 07/09/2019 10:20 AM 0.5 mL 02/2019 Intramuscular   Manufacturer: Moderna   Lot: [id number]   [id number]

## 2019-07-30 NOTE — Unmapped (Signed)
[facility] Refill Coordination Note    Specialty Medication(s) to be Shipped:   Inflammatory Disorders: Benlysta    Other medication(s) to be shipped: n/a     Tonya Boyer, DOB: Sep 30, 1965  Phone: [phone] (home)       All above HIPAA information was verified with patient.     Was a Nurse, learning disability used for this call? No    Completed refill call assessment today to schedule patient's medication shipment from the [facility] [phone]).       Specialty medication(s) and dose(s) confirmed: Regimen is correct and unchanged.   Changes to medications: Tonya Boyer reports no changes at this time.  Changes to insurance: No  Questions for the pharmacist: No    Confirmed patient received Welcome Packet with first shipment. The patient will receive a drug information handout for each medication shipped and additional FDA Medication Guides as required.       DISEASE/MEDICATION-SPECIFIC INFORMATION        For patients on injectable medications: Patient currently has 0 doses left.  Next injection is scheduled for 5/21.    SPECIALTY MEDICATION ADHERENCE     Medication Adherence    Patient reported X missed doses in the last month: 0  Specialty Medication: Benlysta  Patient is on additional specialty medications: No  Patient is on more than two specialty medications: No  Any gaps in refill history greater than 2 weeks in the last 3 months: no  Demonstrates understanding of importance of adherence: yes  Informant: patient                Benlysta 200mg /ml: Patient has 0 days of medication on hand      SHIPPING     Shipping address confirmed in Epic.     Delivery Scheduled: Yes, Expected medication delivery date: 5/20.     Medication will be delivered via UPS to the prescription address in Epic WAM.    Tonya Boyer   [facility] Specialty Technician

## 2019-07-31 MED FILL — BENLYSTA 200 MG/ML SUBCUTANEOUS SYRINGE: 28 days supply | Qty: 4 | Fill #2 | Status: AC

## 2019-07-31 MED FILL — BENLYSTA 200 MG/ML SUBCUTANEOUS SYRINGE: 28 days supply | Qty: 4 | Fill #2

## 2019-08-19 DIAGNOSIS — Z6825 Body mass index (BMI) 25.0-25.9, adult: Secondary | ICD-10-CM | POA: Diagnosis not present

## 2019-08-19 DIAGNOSIS — E7849 Other hyperlipidemia: Secondary | ICD-10-CM | POA: Diagnosis not present

## 2019-08-19 DIAGNOSIS — E782 Mixed hyperlipidemia: Secondary | ICD-10-CM | POA: Diagnosis not present

## 2019-08-19 DIAGNOSIS — E559 Vitamin D deficiency, unspecified: Secondary | ICD-10-CM | POA: Diagnosis not present

## 2019-08-19 DIAGNOSIS — G43909 Migraine, unspecified, not intractable, without status migrainosus: Secondary | ICD-10-CM | POA: Diagnosis not present

## 2019-08-19 DIAGNOSIS — F419 Anxiety disorder, unspecified: Secondary | ICD-10-CM | POA: Diagnosis not present

## 2019-08-19 DIAGNOSIS — I10 Essential (primary) hypertension: Secondary | ICD-10-CM | POA: Diagnosis not present

## 2019-08-19 DIAGNOSIS — Z0000 Encounter for general adult medical examination without abnormal findings: Secondary | ICD-10-CM | POA: Diagnosis not present

## 2019-08-19 DIAGNOSIS — M329 Systemic lupus erythematosus, unspecified: Secondary | ICD-10-CM | POA: Diagnosis not present

## 2019-08-19 DIAGNOSIS — R7309 Other abnormal glucose: Secondary | ICD-10-CM | POA: Diagnosis not present

## 2019-08-23 NOTE — Unmapped (Signed)
[facility] Refill Coordination Note    Specialty Medication(s) to be Shipped:   Inflammatory Disorders: Benlysta    Other medication(s) to be shipped: N/A     Tonya Boyer, DOB: September 11, 1965  Phone: [phone] (home)       All above HIPAA information was verified with patient.     Was a Nurse, learning disability used for this call? No    Completed refill call assessment today to schedule patient's medication shipment from the [facility] [phone]).       Specialty medication(s) and dose(s) confirmed: Regimen is correct and unchanged.   Changes to medications: [name] reports no changes at this time.  Changes to insurance: No  Questions for the pharmacist: No    Confirmed patient received Welcome Packet with first shipment. The patient will receive a drug information handout for each medication shipped and additional FDA Medication Guides as required.       DISEASE/MEDICATION-SPECIFIC INFORMATION        For patients on injectable medications: Patient currently has 1 doses left.  Next injection is scheduled for 08/23/19.    SPECIALTY MEDICATION ADHERENCE     Medication Adherence    Patient reported X missed doses in the last month: 0  Specialty Medication: Benlysta 200mg /ml  Patient is on additional specialty medications: No                Benlysta 200 mg/ml: 1 days of medicine on hand          SHIPPING     Shipping address confirmed in Epic.     Delivery Scheduled: Yes, Expected medication delivery date: 08/27/19.     Medication will be delivered via UPS to the prescription address in Epic WAM.    Nancy Nordmann [facility] Specialty Technician

## 2019-08-26 MED FILL — BENLYSTA 200 MG/ML SUBCUTANEOUS SYRINGE: 28 days supply | Qty: 4 | Fill #3 | Status: AC

## 2019-08-26 MED FILL — BENLYSTA 200 MG/ML SUBCUTANEOUS SYRINGE: 28 days supply | Qty: 4 | Fill #3

## 2019-09-18 NOTE — Unmapped (Signed)
[facility] Refill Coordination Note    Specialty Medication(s) to be Shipped:   Inflammatory Disorders: Benlysta    Other medication(s) to be shipped: none     Tonya Boyer, DOB: Apr 26, 1965  Phone: [phone] (home)       All above HIPAA information was verified with patient.     Was a Nurse, learning disability used for this call? No    Completed refill call assessment today to schedule patient's medication shipment from the [facility] [phone]).       Specialty medication(s) and dose(s) confirmed: Regimen is correct and unchanged.   Changes to medications: Tonya Boyer reports no changes at this time.  Changes to insurance: No  Questions for the pharmacist: No    Confirmed patient received Welcome Packet with first shipment. The patient will receive a drug information handout for each medication shipped and additional FDA Medication Guides as required.       DISEASE/MEDICATION-SPECIFIC INFORMATION        For patients on injectable medications: Patient currently has 1 doses left.  Next injection is scheduled for 09/20/19.    SPECIALTY MEDICATION ADHERENCE     Medication Adherence    Patient reported X missed doses in the last month: 0           Benlysta: 1 dose worth of medication on hand.            SHIPPING     Shipping address confirmed in Epic.     Delivery Scheduled: Yes, Expected medication delivery date: 09/24/19.     Medication will be delivered via UPS to the prescription address in Epic WAM.    Tonya Boyer   [facility] Shared [facility] Specialty Technician

## 2019-09-23 MED FILL — BENLYSTA 200 MG/ML SUBCUTANEOUS SYRINGE: 28 days supply | Qty: 4 | Fill #4

## 2019-09-23 MED FILL — BENLYSTA 200 MG/ML SUBCUTANEOUS SYRINGE: 28 days supply | Qty: 4 | Fill #4 | Status: AC

## 2019-10-16 NOTE — Unmapped (Signed)
Error

## 2019-10-16 NOTE — Unmapped (Signed)
[facility] Clinical Assessment & Refill Coordination Note    Tonya Boyer, DOB: 07-09-1965  Phone: [phone] (home)     All above HIPAA information was verified with patient.     Was a Nurse, learning disability used for this call? No    Specialty Medication(s):   Inflammatory Disorders: Benlysta     Current Outpatient Medications   Medication Sig Dispense Refill   ??? belimumab (BENLYSTA) 200 mg/mL Syrg Inject the contents of 1 syringe (200mg ) under the skin once every 7 days. 12 mL 3   ??? dexamethasone (DECADRON) 10 mg/mL injection Infuse into a venous catheter. Dose unknown, every 6-8 weeks. Injections. Given with lidocaine and marcaine.     ??? DULoxetine (CYMBALTA) 20 MG capsule Take 20 mg by mouth daily.  0   ??? empty container Misc Use as directed 1 each 2   ??? hydrOXYchloroQUINE (PLAQUENIL) 200 mg tablet Take 1.5 tablets (300 mg total) by mouth daily. 135 tablet 3   ??? naproxen sodium (ALEVE) 220 MG tablet Take 220 mg by mouth continuous as needed for pain.       No current facility-administered medications for this visit.        Changes to medications: Besan reports no changes at this time.    Allergies   Allergen Reactions   ??? Amoxicillin Hives   ??? Penicillins Hives   ??? Latex Other (See Comments)     Skin irritation   ??? Penicillin G    ??? Prednisone    ??? Latex, Natural Rubber Rash       Changes to allergies: No    SPECIALTY MEDICATION ADHERENCE     Benlysta 200mg /ml: 7 days of medicine on hand     Medication Adherence    Patient reported X missed doses in the last month: 0  Specialty Medication: Benlysta 200mg /ml          Specialty medication(s) dose(s) confirmed: Regimen is correct and unchanged.     Are there any concerns with adherence? No    Adherence counseling provided? Not needed    CLINICAL MANAGEMENT AND INTERVENTION      Clinical Benefit Assessment:    Do you feel the medicine is effective or helping your condition? Yes    Clinical Benefit counseling provided? Not needed Adverse Effects Assessment:    Are you experiencing any side effects? No    Are you experiencing difficulty administering your medicine? No    Quality of Life Assessment:    Rheumatology:   Quality of Life    On a scale of 1 ??? 10 with 1 representing not at all and 10 representing completely ??? how has your rheumatologic condition affected your:  Daily pain level?: decline to answer  Ability to complete your regular daily tasks (prepare meals, get dressed, etc.)?: decline to answer  Ability to participate in social or family activities?: decline to answer         Have you discussed this with your provider? Not needed    Therapy Appropriateness:    Is therapy appropriate? Yes, therapy is appropriate and should be continued    DISEASE/MEDICATION-SPECIFIC INFORMATION      For patients on injectable medications: Patient currently has 1 doses left.  Next injection is scheduled for 10/18/2019.    PATIENT SPECIFIC NEEDS     - Does the patient have any physical, cognitive, or cultural barriers? No    - Is the patient high risk? No    - Does the  patient require a Care Management Plan? No     - Does the patient require physician intervention or other additional services (i.e. nutrition, smoking cessation, social work)? No      SHIPPING     Specialty Medication(s) to be Shipped:   Inflammatory Disorders: Benlysta 200mg /ml    Other medication(s) to be shipped: No additional medications requested for fill at this time     Changes to insurance: No    Delivery Scheduled: Yes, Expected medication delivery date: 10/22/2019.     Medication will be delivered via UPS to the confirmed prescription address in [facility].    The patient will receive a drug information handout for each medication shipped and additional FDA Medication Guides as required.  Verified that patient has previously received a Conservation officer, historic buildings.    All of the patient's questions and concerns have been addressed.    Karene Fry Mory Herrman   [facility] [employer] [facility] Specialty Pharmacist

## 2019-10-21 MED FILL — BENLYSTA 200 MG/ML SUBCUTANEOUS SYRINGE: 28 days supply | Qty: 4 | Fill #5

## 2019-10-21 MED FILL — BENLYSTA 200 MG/ML SUBCUTANEOUS SYRINGE: 28 days supply | Qty: 4 | Fill #5 | Status: AC

## 2019-10-23 DIAGNOSIS — M542 Cervicalgia: Secondary | ICD-10-CM | POA: Diagnosis not present

## 2019-10-23 DIAGNOSIS — M791 Myalgia, unspecified site: Secondary | ICD-10-CM | POA: Diagnosis not present

## 2019-10-23 DIAGNOSIS — G518 Other disorders of facial nerve: Secondary | ICD-10-CM | POA: Diagnosis not present

## 2019-10-23 DIAGNOSIS — G43719 Chronic migraine without aura, intractable, without status migrainosus: Secondary | ICD-10-CM | POA: Diagnosis not present

## 2019-10-28 NOTE — Unmapped (Signed)
[facility] Pharm[facility] Pharmacist Intervention    Type of intervention: Drug - Vaccine Counseling    Medication: Benlysta 200mg /ml    Problem: Ms. Banos was inquiring whether or not she should receive a booster dose of the COVID-19 vaccine due to being on Benlysta continually.    Intervention: I advised Ms. Bradwell that the [employer] developer for [employer] and Prevention) recently recommended that rheumatology patients being treated with medications that lower the immune system should receive a third dose of the [employer] or Moderna COVID-19 vaccinations. This recommendation does not apply to those who received the Laural Benes and Meckling vaccine currently.  [place] is in the process of developing a system to provide booster shots to our patients who are eligible to receive them. Once these plans are finalized, we will share this information widely.     In the meantime, you can receive a booster at any commercial pharmacy that is providing vaccines ([facility], [facility] etc). The 3rd dose should be given at least 28 days after your 2nd dose and you should receive the same brand that you received for your first 2 doses.     Updated information can be found at the following website as it becomes available:    https://www.gonzalez.org/      Follow up needed: none at this time    Approximate time spent: 15 minutes    Lupita Shutter   [facility] Specialty Pharmacist

## 2019-11-08 DIAGNOSIS — M329 Systemic lupus erythematosus, unspecified: Principal | ICD-10-CM

## 2019-11-08 MED ORDER — HYDROXYCHLOROQUINE 200 MG TABLET
ORAL_TABLET | 3 refills | 0.00000 days | Status: CP
Start: 2019-11-08 — End: ?

## 2019-11-08 NOTE — Unmapped (Signed)
Hydroxychloroquine refill  Last ov: Visit date not found   Next ov: 03/26/2020

## 2019-11-14 NOTE — Unmapped (Signed)
[facility] Pharm[facility] Refill Coordination Note    Specialty Medication(s) to be Shipped:   Inflammatory Disorders: Benlysta    Other medication(s) to be shipped: No additional medications requested for fill at this time     Tonya Boyer, DOB: [birth year]  Phone: [phone] (home)       All above HIPAA information was verified with patient.     Was a Nurse, learning disability used for this call? No    Completed refill call assessment today to schedule patient's medication shipment from the [facility] [phone]).       Specialty medication(s) and dose(s) confirmed: Regimen is correct and unchanged.   Changes to medications: [name] reports no changes at this time.  Changes to insurance: No  Questions for the pharmacist: No    Confirmed patient received Welcome Packet with first shipment. The patient will receive a drug information handout for each medication shipped and additional FDA Medication Guides as required.       DISEASE/MEDICATION-SPECIFIC INFORMATION        For patients on injectable medications: Patient currently has 1 doses left.  Next injection is scheduled for 9/10.    SPECIALTY MEDICATION ADHERENCE     Medication Adherence    Patient reported X missed doses in the last month: 0  Specialty Medication: Benlysta  Patient is on additional specialty medications: No  Patient is on more than two specialty medications: No  Any gaps in refill history greater than 2 weeks in the last 3 months: no  Demonstrates understanding of importance of adherence: yes  Informant: patient                Benlysta 200mg /ml: Patient has 7 days of medication on hand       SHIPPING     Shipping address confirmed in Epic.     Delivery Scheduled: Yes, Expected medication delivery date: 9/8.     Medication will be delivered via UPS to the prescription address in [facility].    [name]   [facility] Specialty Technician

## 2019-11-19 MED FILL — BENLYSTA 200 MG/ML SUBCUTANEOUS SYRINGE: 28 days supply | Qty: 4 | Fill #6 | Status: AC

## 2019-11-19 MED FILL — BENLYSTA 200 MG/ML SUBCUTANEOUS SYRINGE: 28 days supply | Qty: 4 | Fill #6

## 2019-12-12 NOTE — Unmapped (Signed)
[facility] Special[facility] Pharmacy Refill Coordination Note    Specialty Medication(s) to be Shipped:   Inflammatory Disorders: Benlysta    Other medication(s) to be shipped: No additional medications requested for fill at this time     Tonya Boyer, DOB: 07-Aug-1965  Phone: [phone] (home)       All above HIPAA information was verified with patient.     Was a Nurse, learning disability used for this call? No    Completed refill call assessment today to schedule patient's medication shipment from the [facility] [phone]).       Specialty medication(s) and dose(s) confirmed: Regimen is correct and unchanged.   Changes to medications: Cheryll reports no changes at this time.  Changes to insurance: No  Questions for the pharmacist: No    Confirmed patient received Welcome Packet with first shipment. The patient will receive a drug information handout for each medication shipped and additional FDA Medication Guides as required.       DISEASE/MEDICATION-SPECIFIC INFORMATION        For patients on injectable medications: Patient currently has 1 doses left.  Next injection is scheduled for 10/1.    SPECIALTY MEDICATION ADHERENCE     Medication Adherence    Patient reported X missed doses in the last month: 0  Specialty Medication: Benlysta  Patient is on additional specialty medications: No  Patient is on more than two specialty medications: No  Any gaps in refill history greater than 2 weeks in the last 3 months: no  Demonstrates understanding of importance of adherence: yes  Informant: patient                Benlysta 200mg /ml:Patient has 7 days of medication on hand      SHIPPING     Shipping address confirmed in Epic.     Delivery Scheduled: Yes, Expected medication delivery date: 10/7.     Medication will be delivered via UPS to the prescription address in Epic [facility].    [name]   [facility] Specialty Technician

## 2019-12-17 ENCOUNTER — Other Ambulatory Visit: Payer: Self-pay | Admitting: Interventional Radiology

## 2019-12-17 DIAGNOSIS — D1771 Benign lipomatous neoplasm of kidney: Secondary | ICD-10-CM

## 2019-12-18 MED FILL — BENLYSTA 200 MG/ML SUBCUTANEOUS SYRINGE: 28 days supply | Qty: 4 | Fill #7 | Status: AC

## 2019-12-18 MED FILL — BENLYSTA 200 MG/ML SUBCUTANEOUS SYRINGE: 28 days supply | Qty: 4 | Fill #7

## 2019-12-25 DIAGNOSIS — M542 Cervicalgia: Secondary | ICD-10-CM | POA: Diagnosis not present

## 2019-12-25 DIAGNOSIS — G518 Other disorders of facial nerve: Secondary | ICD-10-CM | POA: Diagnosis not present

## 2019-12-25 DIAGNOSIS — G43719 Chronic migraine without aura, intractable, without status migrainosus: Secondary | ICD-10-CM | POA: Diagnosis not present

## 2019-12-25 DIAGNOSIS — M791 Myalgia, unspecified site: Secondary | ICD-10-CM | POA: Diagnosis not present

## 2019-12-27 DIAGNOSIS — H04123 Dry eye syndrome of bilateral lacrimal glands: Secondary | ICD-10-CM | POA: Diagnosis not present

## 2019-12-27 DIAGNOSIS — M3210 Systemic lupus erythematosus, organ or system involvement unspecified: Secondary | ICD-10-CM | POA: Diagnosis not present

## 2019-12-27 DIAGNOSIS — H40013 Open angle with borderline findings, low risk, bilateral: Secondary | ICD-10-CM | POA: Diagnosis not present

## 2019-12-27 DIAGNOSIS — H524 Presbyopia: Secondary | ICD-10-CM | POA: Diagnosis not present

## 2019-12-27 DIAGNOSIS — Z79899 Other long term (current) drug therapy: Secondary | ICD-10-CM | POA: Diagnosis not present

## 2019-12-30 NOTE — Unmapped (Signed)
Received note for plaquenil eye exam from Dr. Wynell Balloon, MD [facility] 12/27/2019, recommend follow-up in 1 year

## 2020-01-03 DIAGNOSIS — K5730 Diverticulosis of large intestine without perforation or abscess without bleeding: Secondary | ICD-10-CM | POA: Diagnosis not present

## 2020-01-03 DIAGNOSIS — D125 Benign neoplasm of sigmoid colon: Secondary | ICD-10-CM | POA: Diagnosis not present

## 2020-01-03 DIAGNOSIS — K635 Polyp of colon: Secondary | ICD-10-CM | POA: Diagnosis not present

## 2020-01-03 DIAGNOSIS — Z1211 Encounter for screening for malignant neoplasm of colon: Secondary | ICD-10-CM | POA: Diagnosis not present

## 2020-01-06 NOTE — Unmapped (Signed)
[facility] Special[facility] Pharmacy Refill Coordination Note    Specialty Medication(s) to be Shipped:   Inflammatory Disorders: Benlysta 200mg /ml    Other medication(s) to be shipped: No additional medications requested for fill at this time     Tonya Boyer, DOB: May 29, 1965  Phone: [phone] (home)       All above HIPAA information was verified with patient.     Was a Nurse, learning disability used for this call? No    Completed refill call assessment today to schedule patient's medication shipment from the [facility] [phone]).       Specialty medication(s) and dose(s) confirmed: Regimen is correct and unchanged.   Changes to medications: Tonya Boyer reports no changes at this time.  Changes to insurance: No  Questions for the pharmacist: No    Confirmed patient received Welcome Packet with first shipment. The patient will receive a drug information handout for each medication shipped and additional FDA Medication Guides as required.       DISEASE/MEDICATION-SPECIFIC INFORMATION        For patients on injectable medications: Patient currently has 1 doses left.  Next injection is scheduled for 01/10/2020.    SPECIALTY MEDICATION ADHERENCE     Medication Adherence    Patient reported X missed doses in the last month: 0  Specialty Medication: Benlysta 200mg /ml          Benlysta 200mg /ml: 7 days of medicine on hand       SHIPPING     Shipping address confirmed in Epic.     Delivery Scheduled: Yes, Expected medication delivery date: 01/15/2020.     Medication will be delivered via UPS to the prescription address in Epic [facility].    Tonya Boyer   [facility] Specialty Pharmacist

## 2020-01-14 MED FILL — BENLYSTA 200 MG/ML SUBCUTANEOUS SYRINGE: 28 days supply | Qty: 4 | Fill #8 | Status: AC

## 2020-01-14 MED FILL — EMPTY CONTAINER: 120 days supply | Qty: 1 | Fill #0

## 2020-01-14 MED FILL — EMPTY CONTAINER: 120 days supply | Qty: 1 | Fill #0 | Status: AC

## 2020-01-14 MED FILL — BENLYSTA 200 MG/ML SUBCUTANEOUS SYRINGE: 28 days supply | Qty: 4 | Fill #8

## 2020-01-16 DIAGNOSIS — Z6826 Body mass index (BMI) 26.0-26.9, adult: Secondary | ICD-10-CM | POA: Diagnosis not present

## 2020-01-16 DIAGNOSIS — M2550 Pain in unspecified joint: Secondary | ICD-10-CM | POA: Diagnosis not present

## 2020-01-16 DIAGNOSIS — M67441 Ganglion, right hand: Secondary | ICD-10-CM | POA: Diagnosis not present

## 2020-01-16 DIAGNOSIS — E663 Overweight: Secondary | ICD-10-CM | POA: Diagnosis not present

## 2020-01-16 DIAGNOSIS — Z23 Encounter for immunization: Secondary | ICD-10-CM | POA: Diagnosis not present

## 2020-02-04 NOTE — Unmapped (Signed)
[facility] Refill Coordination Note    Specialty Medication(s) to be Shipped:   Inflammatory Disorders: Benlysta    Other medication(s) to be shipped: No additional medications requested for fill at this time     Tonya Boyer, DOB: 01-23-1966  Phone: [phone] (home)       All above HIPAA information was verified with patient.     Was a Nurse, learning disability used for this call? No    Completed refill call assessment today to schedule patient's medication shipment from the [facility] [phone]).       Specialty medication(s) and dose(s) confirmed: Regimen is correct and unchanged.   Changes to medications: Tonya Boyer reports no changes at this time.  Changes to insurance: No  Questions for the pharmacist: No    Confirmed patient received Welcome Packet with first shipment. The patient will receive a drug information handout for each medication shipped and additional FDA Medication Guides as required.       DISEASE/MEDICATION-SPECIFIC INFORMATION        For patients on injectable medications: Patient currently has 1 doses left.  Next injection is scheduled for 11/26.    SPECIALTY MEDICATION ADHERENCE     Medication Adherence    Patient reported X missed doses in the last month: 0  Specialty Medication: Benlysta  Patient is on additional specialty medications: No  Patient is on more than two specialty medications: No  Any gaps in refill history greater than 2 weeks in the last 3 months: no  Demonstrates understanding of importance of adherence: yes  Informant: patient                Benlysta 200mg /ml: Patient has 7 days of medication on hand      SHIPPING     Shipping address confirmed in Epic.     Delivery Scheduled: Yes, Expected medication delivery date: 12/2.     Medication will be delivered via UPS to the prescription address in Epic [facility].    Tonya Boyer   [facility] Specialty Technician

## 2020-02-12 MED FILL — BENLYSTA 200 MG/ML SUBCUTANEOUS SYRINGE: 28 days supply | Qty: 4 | Fill #9

## 2020-02-12 MED FILL — BENLYSTA 200 MG/ML SUBCUTANEOUS SYRINGE: 28 days supply | Qty: 4 | Fill #9 | Status: AC

## 2020-02-18 DIAGNOSIS — Z1231 Encounter for screening mammogram for malignant neoplasm of breast: Secondary | ICD-10-CM | POA: Diagnosis not present

## 2020-02-18 DIAGNOSIS — Z01419 Encounter for gynecological examination (general) (routine) without abnormal findings: Secondary | ICD-10-CM | POA: Diagnosis not present

## 2020-02-18 DIAGNOSIS — Z6825 Body mass index (BMI) 25.0-25.9, adult: Secondary | ICD-10-CM | POA: Diagnosis not present

## 2020-02-18 DIAGNOSIS — M791 Myalgia, unspecified site: Secondary | ICD-10-CM | POA: Diagnosis not present

## 2020-02-18 DIAGNOSIS — G518 Other disorders of facial nerve: Secondary | ICD-10-CM | POA: Diagnosis not present

## 2020-02-18 DIAGNOSIS — M542 Cervicalgia: Secondary | ICD-10-CM | POA: Diagnosis not present

## 2020-02-18 DIAGNOSIS — G43719 Chronic migraine without aura, intractable, without status migrainosus: Secondary | ICD-10-CM | POA: Diagnosis not present

## 2020-03-05 NOTE — Unmapped (Signed)
[facility] Pharm[facility] Clinical Assessment & Refill Coordination Note    Tonya Boyer, DOB: 11-Jun-1965  Phone: [phone] (home)     All above HIPAA information was verified with patient.     Was a Nurse, learning disability used for this call? No    Specialty Medication(s):   Inflammatory Disorders: Benlysta     Current Outpatient Medications   Medication Sig Dispense Refill   ??? belimumab (BENLYSTA) 200 mg/mL Syrg Inject the contents of 1 syringe (200mg ) under the skin once every 7 days. 12 mL 3   ??? dexamethasone (DECADRON) 10 mg/mL injection Infuse into a venous catheter. Dose unknown, every 6-8 weeks. Injections. Given with lidocaine and marcaine.     ??? DULoxetine (CYMBALTA) 20 MG capsule Take 20 mg by mouth daily.  0   ??? empty container Misc Use as directed 1 each 2   ??? empty container Misc Use as directed 1 each 2   ??? hydrOXYchloroQUINE (PLAQUENIL) 200 mg tablet TAKE 1 TABLET EVERY DAY 90 tablet 3   ??? naproxen sodium (ALEVE) 220 MG tablet Take 220 mg by mouth continuous as needed for pain.       No current facility-administered medications for this visit.        Changes to medications: Tonya Boyer reports no changes at this time.    Allergies   Allergen Reactions   ??? Amoxicillin Hives   ??? Penicillins Hives   ??? Latex Other (See Comments)     Skin irritation   ??? Penicillin G    ??? Prednisone    ??? Latex, Natural Rubber Rash       Changes to allergies: No    SPECIALTY MEDICATION ADHERENCE     Benlysta 200mg /ml: 7 days of medicine on hand     Medication Adherence    Patient reported X missed doses in the last month: 0  Specialty Medication: Benlysta 200mg /ml          Specialty medication(s) dose(s) confirmed: Regimen is correct and unchanged.     Are there any concerns with adherence? No    Adherence counseling provided? Not needed    CLINICAL MANAGEMENT AND INTERVENTION      Clinical Benefit Assessment:    Do you feel the medicine is effective or helping your condition? Yes    Clinical Benefit counseling provided? Not needed    Adverse Effects Assessment:    Are you experiencing any side effects? No    Are you experiencing difficulty administering your medicine? No    Quality of Life Assessment:    Rheumatology:   Quality of Life    On a scale of 1 ??? 10 with 1 representing not at all and 10 representing completely ??? how has your rheumatologic condition affected your:  Daily pain level?: decline to answer  Ability to complete your regular daily tasks (prepare meals, get dressed, etc.)?: decline to answer  Ability to participate in social or family activities?: decline to answer         Have you discussed this with your provider? Not needed    Therapy Appropriateness:    Is therapy appropriate? Yes, therapy is appropriate and should be continued    DISEASE/MEDICATION-SPECIFIC INFORMATION      For patients on injectable medications: Patient currently has 1 doses left.  Next injection is scheduled for 03/06/2020.    PATIENT SPECIFIC NEEDS     - Does the patient have any physical, cognitive, or cultural barriers? No    - Is  the patient high risk? No    - Does the patient require a Care Management Plan? No     - Does the patient require physician intervention or other additional services (i.e. nutrition, smoking cessation, social work)? No      SHIPPING     Specialty Medication(s) to be Shipped:   Inflammatory Disorders: Benlysta 200mg /ml    Other medication(s) to be shipped: No additional medications requested for fill at this time     Changes to insurance: No    Delivery Scheduled: Yes, Expected medication delivery date: 03/10/2020.     Medication will be delivered via UPS to the confirmed prescription address in [facility].    The patient will receive a drug information handout for each medication shipped and additional FDA Medication Guides as required.  Verified that patient has previously received a Conservation officer, historic buildings.    All of the patient's questions and concerns have been addressed.    Karene Fry Bindu Docter   [facility] [employer] [facility] Specialty Pharmacist

## 2020-03-09 MED FILL — BENLYSTA 200 MG/ML SUBCUTANEOUS SYRINGE: 28 days supply | Qty: 4 | Fill #10 | Status: AC

## 2020-03-09 MED FILL — BENLYSTA 200 MG/ML SUBCUTANEOUS SYRINGE: 28 days supply | Qty: 4 | Fill #10

## 2020-03-26 ENCOUNTER — Ambulatory Visit: Admit: 2020-03-26 | Discharge: 2020-03-27 | Payer: MEDICARE

## 2020-03-26 DIAGNOSIS — M329 Systemic lupus erythematosus, unspecified: Secondary | ICD-10-CM | POA: Diagnosis not present

## 2020-03-26 DIAGNOSIS — Z6826 Body mass index (BMI) 26.0-26.9, adult: Secondary | ICD-10-CM | POA: Diagnosis not present

## 2020-03-26 DIAGNOSIS — Z9229 Personal history of other drug therapy: Secondary | ICD-10-CM | POA: Diagnosis not present

## 2020-03-26 DIAGNOSIS — Z79899 Other long term (current) drug therapy: Secondary | ICD-10-CM | POA: Diagnosis not present

## 2020-03-26 LAB — CBC W/ AUTO DIFF
BASOPHILS ABSOLUTE COUNT: 0 10*9/L (ref 0.0–0.1)
BASOPHILS RELATIVE PERCENT: 1.1 %
EOSINOPHILS ABSOLUTE COUNT: 0 10*9/L (ref 0.0–0.7)
EOSINOPHILS RELATIVE PERCENT: 1.3 %
HEMATOCRIT: 38.3 % (ref 35.0–44.0)
HEMOGLOBIN: 12.6 g/dL (ref 12.0–15.5)
LYMPHOCYTES ABSOLUTE COUNT: 1 10*9/L (ref 0.7–4.0)
LYMPHOCYTES RELATIVE PERCENT: 27.8 %
MEAN CORPUSCULAR HEMOGLOBIN CONC: 32.8 g/dL (ref 30.0–36.0)
MEAN CORPUSCULAR HEMOGLOBIN: 31.7 pg (ref 26.0–34.0)
MEAN CORPUSCULAR VOLUME: 96.6 fL (ref 82.0–98.0)
MEAN PLATELET VOLUME: 7.7 fL (ref 7.0–10.0)
MONOCYTES ABSOLUTE COUNT: 0.5 10*9/L (ref 0.1–1.0)
MONOCYTES RELATIVE PERCENT: 12.2 %
NEUTROPHILS ABSOLUTE COUNT: 2.2 10*9/L (ref 1.7–7.7)
NEUTROPHILS RELATIVE PERCENT: 57.6 %
NUCLEATED RED BLOOD CELLS: 0 /100{WBCs} (ref <=4)
PLATELET COUNT: 288 10*9/L (ref 150–450)
RED BLOOD CELL COUNT: 3.96 10*12/L (ref 3.90–5.03)
RED CELL DISTRIBUTION WIDTH: 12.8 % (ref 12.0–15.0)
WBC ADJUSTED: 3.8 10*9/L (ref 3.5–10.5)

## 2020-03-26 LAB — COMPREHENSIVE METABOLIC PANEL
ALBUMIN: 3.9 g/dL (ref 3.4–5.0)
ALKALINE PHOSPHATASE: 174 U/L — ABNORMAL HIGH (ref 46–116)
ALT (SGPT): 27 U/L (ref 10–49)
ANION GAP: 4 mmol/L — ABNORMAL LOW (ref 5–14)
AST (SGOT): 27 U/L (ref <=34)
BILIRUBIN TOTAL: 0.6 mg/dL (ref 0.3–1.2)
BLOOD UREA NITROGEN: 15 mg/dL (ref 9–23)
BUN / CREAT RATIO: 20
CALCIUM: 9.7 mg/dL (ref 8.7–10.4)
CHLORIDE: 109 mmol/L — ABNORMAL HIGH (ref 98–107)
CO2: 30.4 mmol/L (ref 20.0–31.0)
CREATININE: 0.74 mg/dL
EGFR CKD-EPI AA FEMALE: >90 mL/min/{1.73_m2} (ref >=60)
EGFR CKD-EPI NON-AA FEMALE: >90 mL/min/{1.73_m2} (ref >=60)
GLUCOSE RANDOM: 68 mg/dL — ABNORMAL LOW (ref 70–179)
POTASSIUM: 3.7 mmol/L (ref 3.4–4.5)
PROTEIN TOTAL: 7.2 g/dL (ref 5.7–8.2)
SODIUM: 143 mmol/L (ref 135–145)

## 2020-03-26 LAB — C4 COMPLEMENT: C4 COMPLEMENT: 33.8 mg/dL (ref 12.0–36.0)

## 2020-03-26 LAB — URINALYSIS WITH CULTURE REFLEX
BILIRUBIN UA: NEGATIVE
BLOOD UA: NEGATIVE
GLUCOSE UA: NEGATIVE
KETONES UA: NEGATIVE
NITRITE UA: NEGATIVE
PH UA: 5.5 (ref 5.0–9.0)
PROTEIN UA: NEGATIVE
RBC UA: <1 /HPF (ref 0–3)
SPECIFIC GRAVITY UA: >=1.03 (ref 1.005–1.030)
SQUAMOUS EPITHELIAL: 5 /HPF (ref 0–5)
UROBILINOGEN UA: 0.2
WBC UA: 9 /HPF — ABNORMAL HIGH (ref 0–3)

## 2020-03-26 LAB — PROTEIN / CREATININE RATIO, URINE
CREATININE, URINE: 110.9 mg/dL
PROTEIN URINE: 18.6 mg/dL
PROTEIN/CREAT RATIO, URINE: 0.168

## 2020-03-26 LAB — C3 COMPLEMENT: C3 COMPLEMENT: 130 mg/dL (ref 90–170)

## 2020-03-26 MED ORDER — BENLYSTA 200 MG/ML SUBCUTANEOUS SYRINGE
3 refills | 0 days | Status: CP
Start: 2020-03-26 — End: 2021-03-26
  Filled 2020-04-07: qty 4, 28d supply, fill #0

## 2020-03-26 MED ORDER — HYDROXYCHLOROQUINE 200 MG TABLET
ORAL_TABLET | Freq: Every day | ORAL | 3 refills | 90 days | Status: CP
Start: 2020-03-26 — End: ?

## 2020-03-26 NOTE — Unmapped (Signed)
Last Telemedicine visit: 05/21/2019    Accompanied by: Alone    Chief complaint: Follow-up systemic lupus erythematosus (SLE)     History of Present Illness:     HPI:  Tonya Boyer is a 55 y.o. female with SLE (dx 2014) characerized by fatigue, leukopenia, arthralgias, pleurisy, reactive LAD, fatigue, brain fog, +ANA, +dsDNA, +Ro, +RNP, low titer aCL IgM who presents for follow-up visit of SLE. In brief summary, at time of diagnosis in 2014 the patient was started on MTX [place] with significant improvement in arthralgia, brain fog, fatigue but DC'd in 04/2014 2/2 mild elevation in LFTs and leukopenia (ANC <1),??leflunomide then tried without improvement in joint pain. G6PD markedly low, thus plaquenil not started due to concern for hemolytic anemia. Has not been able to tolerate steroids (even low dose) 2/2 anxiety. Trial of azathioprine started in 06/2014 with close monitoring of counts 2/2 low normal TPMT but DC'd 2/2 worsening leukopenia (WBC 2.4, ANC 1) in beginning of 11/2014. In 12/2014, the patient was started on belimumab. Of note, the patient has had persistently enlarged LAD - work up including PET CT with hypermetabolic LAD in neck, chest, pelvis. SLE vs. Lymphoma. Had bx 10/2014 by ENT of L neck node - reactive hyperplasia, no evidence of malignancy. Started plaquenil Jan 2020.  ??  In 07/2015, the patient was seen by Dr. Janee Morn for neurocognitive testing which revealed mild impairment consistent with a patient with SLE and was felt that fatigue was playing a role in her reduced cognitive efficiency. Recommended going to counseling as well due to adjustment to disability and depression/anxiety. TTE 07/2015 also normal. Sent for MRI brain/MRA head and neck which were normal 10/2015.    Current Treatment:  - Plaquenil 200 mg daily.   - Benlysta 200 mg sq weekly     Interval History:  - She notes that things have been going overall well since her last appointment  - The patient describes that she has been having tachycardia starting a few months ago. PCP referred to cardiology, not yet seen.   - She has still been having sharp pains starting on her left chest that has now progressed to her right side and her back  - She has been having left knee pain and saw her PCP and was treated an injection and prescribed a topical cream  - Continues to have sharp pains diffusely, but primarily in her head and b/l legs  - She is being followed by Neurology and is treated with dexamethasone trigger point injections and notes significant improvement with this  - Still has some brain fog, but notes it is better and more manageable than before  - Endorses some dry eyes and dry mouth and nasal ulcers  - Denies rash, oral ulcers, hair loss, photosensitivity  - She notes some pain in her ribs and sternum    Record review:  I have reviewed the patient's allergies, medications, pertinent past medical, surgical, social and family history and have updated in Epic where appropriate. I have also reviewed the pertinent past medical records including notes, labs, imaging tests in the medical record and in Care Everywhere.     Objective    Physical Exam:  Vitals:    03/26/20 1111   BP: 136/90   BP Site: L Arm   BP Position: Sitting   BP Cuff Size: Medium   Pulse: 85   Temp: 36.2 ??C (97.2 ??F)   Weight: 72.1 kg (159 lb)     Body mass index  is 26.46 kg/m??.  GENERAL: The patient is well appearing, in no acute distress. Ambulates around exam room easily and climbs on exam table without difficulty.  SKIN: No rash.   EYES: EOMI, PERRL. Sclera anicteric, conjunctiva non- injected.   ENT: mucus membranes moist.   Neck: supple, no cervical lymphadenopathy  Respiratory: Breathing non-labored, CTA bilaterally. Anterior chest mild diffuse TTP  CV: Heart rate regular, no murmurs  GI: Abdomen soft, nontender, nondistended  VASCULAR: warm and well perfused extremities, no c/c/e.  NEURO: CN 2-12 grossly intact.   PSYCH: No depression or anxiety. Cooperative. Alert and oriented.   MUSCULOSKELETAL:   ?? Bilateral shoulders, elbows, wrists, hands, fingers:  No deformity, erythema, warmth, swelling, effusion, tenderness, or limited ROM.?? Able to curl all fingers. Prayer sign negative.   ?? Hips without limited ROM.??  ?? Bilateral knees, ankles, feet, toes: No deformity, erythema, warmth, swelling, effusion, tenderness, or limited ROM. MTP squeeze negative. Except: L ankle mild TTP.     Assessment/Plan:     Tonya Boyer is a 55 y.o. female with a history of SLE (dx 2014) characerized by fatigue, leukopenia, arthralgia, pleurisy, reactive LAD, brain fog, +ANA, +dsDNA, +Ro, +RNP, low titer aCL IgM currently on belimumab SQ weekly who presents for follow up.  ??  Systemic lupus erythematosus (SLE): Overall, the patient's lupus is well-controlled on her current medication. Continues to have intermittent pains including anterior chest pain which is reproducible when she pushes on her anterior chest.  Had echo previously in Dec 2019 which was unremarkable, no pericardial effusion. This may be due to costochondritis, unlikely due to SLE.   - Continue Plaquenil 200 mg daily  - Continue Benlysta weekly.   - Discussed that the patient can schedule to receive Evusheld due presumed poor response to the COVID-19 vaccination given Benlysta use, she is interested in scheduling, therapy plan placed.   - The patient will receive updated SLE monitoring labs today in clinic.   ??  High risk medication monitoring:??Benlysta  - Patient is currently taking??Benlysta, an immunosuppressant medication that requires intensive monitoring. This monitoring was done today through history, physical, and/or lab testing.   - No recent infections or hospitalizations.   - Reviewed CBC and CMP from 05/16/2019.??  ??  Long-term plaquenil use: Patient is currently taking plaquenil which requires intensive monitoring including regular eye exams due to risk for retinal toxicity.               - Plaquenil started: Jan 2020   - Last eye exam was: Dr. [place] DeMarco OD [facility], October 2021  - Patient has the following risk factors for retinal toxicity from plaquenil: None   ??????  Dizziness and HA/brain fog/anxiety:??MRI brain and MRA head and neck normal Aug 2017. Neurocognitive testing in 07/2015 with mild impairment but likely multifactorial including SLE, fatigue, anxiety/depression. May be exacerbated by poor sleep.   - The patient endorsed that this has overall gotten better and will continue on current treatment    ??  Fibromalgia: Not discussed today.     Immunization Counseling:   Prevnar PCV-13: Oct 2019  Pneumovax PPSV-23: Sept 2016  Flu: Undetermined  Covid-19 (Moderna): 07/09/2019, 06/07/2019  Offered Evusheld given Benlysta use which she is interested in, therapy plan entered.     We discussed the above including diagnosis and recommendations, agreed on the above plan, and all questions were answered.    Follow-up: 9 month follow up with me (in-person)      Scribe's  Attestation: Danella Maiers, MD obtained and performed the history, physical exam and medical decision making elements that were entered into the chart.  Signed by Rachael Fee Scribe, on March 25, 2020 9:31 PM     ----------------------------------------------------------------------------------------------------------------------  March 26, 2020 11:50 AM. Documentation assistance provided by the Scribe. I was present during the time the encounter was recorded. The information recorded by the Scribe was done at my direction and has been reviewed and validated by me.  ----------------------------------------------------------------------------------------------------------------------        Danella Maiers, MD, [facility]  Assistant Professor of Medicine  Department of Medicine/Division of Rheumatology  [facility]  [phone] clinic phone  [phone] clinic secure fax      We appreciate the opportunity to participate in the care of this patient. I personally spent 27 minutes face-to-face and non-face-to-face in the care of this patient, which includes all pre, intra, and post visit time on the date of service.       Diagnoses and all orders for this visit:    Systemic lupus erythematosus, unspecified SLE type, unspecified organ involvement status (CMS-HCC)

## 2020-03-27 NOTE — Unmapped (Signed)
I have tried Thursday and Friday to reach this patient to get her scheduled for the COVID injection.

## 2020-03-30 NOTE — Unmapped (Signed)
Sent the pt the following message:    Still waiting on the dsDNA which always takes longer, but the results from your other labs are normal or with minor abnormalities without clinical significance.     I will update you when that last test is complete.     Dr. Sullivan Lone

## 2020-03-31 NOTE — Unmapped (Signed)
[facility] Refill Coordination Note    Specialty Medication(s) to be Shipped:   Inflammatory Disorders: Benlysta    Other medication(s) to be shipped: No additional medications requested for fill at this time     Tonya Boyer, DOB: 10-18-1965  Phone: [phone] (home)       All above HIPAA information was verified with patient.     Was a Nurse, learning disability used for this call? No    Completed refill call assessment today to schedule patient's medication shipment from the [facility] [phone]).       Specialty medication(s) and dose(s) confirmed: Regimen is correct and unchanged.   Changes to medications: Sia reports no changes at this time.  Changes to insurance: No  Questions for the pharmacist: No    Confirmed patient received Welcome Packet with first shipment. The patient will receive a drug information handout for each medication shipped and additional FDA Medication Guides as required.       DISEASE/MEDICATION-SPECIFIC INFORMATION        For patients on injectable medications: Patient currently has 1 doses left.  Next injection is scheduled for 04/03/2020.    SPECIALTY MEDICATION ADHERENCE     Medication Adherence    Patient reported X missed doses in the last month: 0  Specialty Medication: Benlysta 200mg /ml          Benlysta 200mg /ml: 7 days of medicine on hand       SHIPPING     Shipping address confirmed in Epic.     Delivery Scheduled: Yes, Expected medication delivery date: 04/08/2020.     Medication will be delivered via UPS to the prescription address in Epic WAM.    Lupita Shutter   [facility] Specialty Pharmacist

## 2020-04-01 DIAGNOSIS — M329 Systemic lupus erythematosus, unspecified: Principal | ICD-10-CM

## 2020-04-02 LAB — ANTI-DNA ANTIBODY, DOUBLE-STRANDED: DSDNA ANTIBODY: NEGATIVE

## 2020-04-03 NOTE — Unmapped (Signed)
Sent pt the following message:    The last test we were waiting for was normal.  Let me know if you have any questions.    Dr. Ignacia Gentzler

## 2020-04-07 ENCOUNTER — Ambulatory Visit: Admit: 2020-04-07 | Discharge: 2020-04-08 | Payer: MEDICARE

## 2020-04-07 DIAGNOSIS — Z79899 Other long term (current) drug therapy: Secondary | ICD-10-CM | POA: Diagnosis not present

## 2020-04-07 DIAGNOSIS — M329 Systemic lupus erythematosus, unspecified: Secondary | ICD-10-CM | POA: Diagnosis not present

## 2020-04-07 MED ADMIN — tixagevimab-cilgavimab 150 mg/1.5 mL- 150 mg/1.5 mL injection 3 mL: 3 mL | INTRAMUSCULAR | @ 16:00:00 | Stop: 2020-04-07

## 2020-04-07 NOTE — Unmapped (Signed)
Patient presents in clinic for Evusheld Injection  Injection given in the left and right dorsogluteal  Lot#: [id number]  Exp:10/11/2020  No injection site reaction noted.  Patient tolerated the medication well.  Time injection given Patient education discussed with patient, copy of FDA patient fact sheet given.    Patients blood pressure was elevated prior to receiving injections. Blood pressure repeated and it was still elevated.    I discussed the elevated blood pressure with Dr. Sullivan Lone who stated it is OK for patient to continue with Evusheld therapy.    Return as scheduled with provider.

## 2020-04-22 DIAGNOSIS — G43719 Chronic migraine without aura, intractable, without status migrainosus: Secondary | ICD-10-CM | POA: Diagnosis not present

## 2020-04-22 DIAGNOSIS — M791 Myalgia, unspecified site: Secondary | ICD-10-CM | POA: Diagnosis not present

## 2020-04-22 DIAGNOSIS — M542 Cervicalgia: Secondary | ICD-10-CM | POA: Diagnosis not present

## 2020-04-22 DIAGNOSIS — G518 Other disorders of facial nerve: Secondary | ICD-10-CM | POA: Diagnosis not present

## 2020-04-28 NOTE — Unmapped (Signed)
[facility] Refill Coordination Note    Specialty Medication(s) to be Shipped:   Inflammatory Disorders: Benlysta    Other medication(s) to be shipped: No additional medications requested for fill at this time     Tonya Boyer, DOB: 1965-04-18  Phone: [phone] (home)       All above HIPAA information was verified with patient.     Was a Nurse, learning disability used for this call? No    Completed refill call assessment today to schedule patient's medication shipment from the [facility] [phone]).       Specialty medication(s) and dose(s) confirmed: Regimen is correct and unchanged.   Changes to medications: Tonya Boyer reports no changes at this time.  Changes to insurance: No  Questions for the pharmacist: No    Confirmed patient received Welcome Packet with first shipment. The patient will receive a drug information handout for each medication shipped and additional FDA Medication Guides as required.       DISEASE/MEDICATION-SPECIFIC INFORMATION        For patients on injectable medications: Patient currently has 1 doses left.  Next injection is scheduled for 2/18.    SPECIALTY MEDICATION ADHERENCE     Medication Adherence    Patient reported X missed doses in the last month: 0  Specialty Medication: Benlysta  Patient is on additional specialty medications: No  Patient is on more than two specialty medications: No  Any gaps in refill history greater than 2 weeks in the last 3 months: no  Demonstrates understanding of importance of adherence: yes  Informant: patient                Benlysta 200mg /ml: Patient has 7 days of medication on hand      SHIPPING     Shipping address confirmed in Epic.     Delivery Scheduled: Yes, Expected medication delivery date: 2/23.     Medication will be delivered via UPS to the prescription address in Epic WAM.    Tonya Boyer   [facility] Specialty Technician

## 2020-05-05 MED FILL — BENLYSTA 200 MG/ML SUBCUTANEOUS SYRINGE: 28 days supply | Qty: 4 | Fill #1

## 2020-05-13 DIAGNOSIS — M329 Systemic lupus erythematosus, unspecified: Principal | ICD-10-CM

## 2020-05-28 NOTE — Unmapped (Signed)
[facility] Refill Coordination Note    Specialty Medication(s) to be Shipped:   Inflammatory Disorders: Benlysta    Other medication(s) to be shipped: No additional medications requested for fill at this time     Marveen Donlon, DOB: November 02, 1965  Phone: [phone] (home)       All above HIPAA information was verified with patient.     Was a Nurse, learning disability used for this call? No    Completed refill call assessment today to schedule patient's medication shipment from the [facility] [phone]).       Specialty medication(s) and dose(s) confirmed: Regimen is correct and unchanged.   Changes to medications: Renesmae reports no changes at this time.  Changes to insurance: No  Questions for the pharmacist: No    Confirmed patient received Welcome Packet with first shipment. The patient will receive a drug information handout for each medication shipped and additional FDA Medication Guides as required.       DISEASE/MEDICATION-SPECIFIC INFORMATION        For patients on injectable medications: Patient currently has 1 doses left.  Next injection is scheduled for 3/18.    SPECIALTY MEDICATION ADHERENCE     Medication Adherence    Patient reported X missed doses in the last month: 0  Specialty Medication: Benlysta  Patient is on additional specialty medications: No  Patient is on more than two specialty medications: No  Any gaps in refill history greater than 2 weeks in the last 3 months: no  Demonstrates understanding of importance of adherence: yes  Informant: patient                Benlysta 200mg /ml: Patient has 7 days of medication on hand      SHIPPING     Shipping address confirmed in Epic.     Delivery Scheduled: Yes, Expected medication delivery date: 3/23.     Medication will be delivered via UPS to the prescription address in Epic WAM.    [name]   [facility] Specialty Technician

## 2020-06-02 ENCOUNTER — Ambulatory Visit: Admit: 2020-06-02 | Discharge: 2020-06-03 | Payer: MEDICARE | Attending: Internal Medicine | Primary: Internal Medicine

## 2020-06-02 DIAGNOSIS — Z23 Encounter for immunization: Secondary | ICD-10-CM | POA: Diagnosis not present

## 2020-06-02 DIAGNOSIS — M329 Systemic lupus erythematosus, unspecified: Secondary | ICD-10-CM | POA: Diagnosis not present

## 2020-06-02 DIAGNOSIS — Z298 Encounter for other specified prophylactic measures: Secondary | ICD-10-CM | POA: Diagnosis not present

## 2020-06-02 DIAGNOSIS — Z20822 Contact with and (suspected) exposure to covid-19: Secondary | ICD-10-CM | POA: Diagnosis not present

## 2020-06-02 MED ADMIN — tixagevimab-cilgavimab 150 mg/1.5 mL- 150 mg/1.5 mL injection 3 mL: 3 mL | INTRAMUSCULAR | @ 14:00:00 | Stop: 2020-06-02

## 2020-06-02 MED FILL — BENLYSTA 200 MG/ML SUBCUTANEOUS SYRINGE: 28 days supply | Qty: 4 | Fill #2

## 2020-06-02 NOTE — Unmapped (Signed)
Patient presents in clinic for Evusheld Injection  Injection given in the left and right dorsogluteal  Lot#: [id number]  Exp:11/10/2020  No injection site reaction noted.  Patient tolerated the medication well.  Time injection given: 1020   am  Patient education discussed with patient, copy of FDA patient fact sheet given.      Return as scheduled with provider.

## 2020-06-19 DIAGNOSIS — L821 Other seborrheic keratosis: Secondary | ICD-10-CM | POA: Diagnosis not present

## 2020-06-19 DIAGNOSIS — M542 Cervicalgia: Secondary | ICD-10-CM | POA: Diagnosis not present

## 2020-06-19 DIAGNOSIS — L659 Nonscarring hair loss, unspecified: Secondary | ICD-10-CM | POA: Diagnosis not present

## 2020-06-19 DIAGNOSIS — L732 Hidradenitis suppurativa: Secondary | ICD-10-CM | POA: Diagnosis not present

## 2020-06-19 DIAGNOSIS — M791 Myalgia, unspecified site: Secondary | ICD-10-CM | POA: Diagnosis not present

## 2020-06-19 DIAGNOSIS — D2262 Melanocytic nevi of left upper limb, including shoulder: Secondary | ICD-10-CM | POA: Diagnosis not present

## 2020-06-19 DIAGNOSIS — G43719 Chronic migraine without aura, intractable, without status migrainosus: Secondary | ICD-10-CM | POA: Diagnosis not present

## 2020-06-19 DIAGNOSIS — L298 Other pruritus: Secondary | ICD-10-CM | POA: Diagnosis not present

## 2020-06-19 DIAGNOSIS — G518 Other disorders of facial nerve: Secondary | ICD-10-CM | POA: Diagnosis not present

## 2020-06-19 DIAGNOSIS — D171 Benign lipomatous neoplasm of skin and subcutaneous tissue of trunk: Secondary | ICD-10-CM | POA: Diagnosis not present

## 2020-06-19 DIAGNOSIS — D2261 Melanocytic nevi of right upper limb, including shoulder: Secondary | ICD-10-CM | POA: Diagnosis not present

## 2020-06-19 DIAGNOSIS — L723 Sebaceous cyst: Secondary | ICD-10-CM | POA: Diagnosis not present

## 2020-06-25 ENCOUNTER — Other Ambulatory Visit: Payer: Self-pay

## 2020-06-25 ENCOUNTER — Ambulatory Visit (HOSPITAL_COMMUNITY)
Admission: RE | Admit: 2020-06-25 | Discharge: 2020-06-25 | Disposition: A | Discharge status: Home / Self Care | Payer: Medicare PPO | Source: Ambulatory Visit | Attending: Family Medicine | Admitting: Family Medicine

## 2020-06-25 ENCOUNTER — Other Ambulatory Visit (HOSPITAL_COMMUNITY): Payer: Self-pay | Admitting: Family Medicine

## 2020-06-25 DIAGNOSIS — Z6827 Body mass index (BMI) 27.0-27.9, adult: Secondary | ICD-10-CM | POA: Diagnosis not present

## 2020-06-25 DIAGNOSIS — Z1331 Encounter for screening for depression: Secondary | ICD-10-CM | POA: Diagnosis not present

## 2020-06-25 DIAGNOSIS — R079 Chest pain, unspecified: Secondary | ICD-10-CM | POA: Diagnosis not present

## 2020-06-25 DIAGNOSIS — M797 Fibromyalgia: Secondary | ICD-10-CM | POA: Diagnosis not present

## 2020-06-25 DIAGNOSIS — N342 Other urethritis: Secondary | ICD-10-CM

## 2020-06-25 DIAGNOSIS — N341 Nonspecific urethritis: Secondary | ICD-10-CM | POA: Diagnosis not present

## 2020-06-25 DIAGNOSIS — M329 Systemic lupus erythematosus, unspecified: Secondary | ICD-10-CM | POA: Diagnosis not present

## 2020-06-25 NOTE — Unmapped (Signed)
[facility] Pharm[facility] Refill Coordination Note    Specialty Medication(s) to be Shipped:   Inflammatory Disorders: Benlysta    Other medication(s) to be shipped: No additional medications requested for fill at this time     Tonya Boyer, DOB: Sep 04, 1965  Phone: [phone] (home)       All above HIPAA information was verified with patient.     Was a Nurse, learning disability used for this call? No    Completed refill call assessment today to schedule patient's medication shipment from the [facility] [phone]).  All relevant notes have been reviewed.     Specialty medication(s) and dose(s) confirmed: Regimen is correct and unchanged.   Changes to medications: [name] reports no changes at this time.  Changes to insurance: No  New side effects reported not previously addressed with a pharmacist or physician: None reported  Questions for the pharmacist: No    Confirmed patient received a Conservation officer, historic buildings and a Surveyor, mining with first shipment. The patient will receive a drug information handout for each medication shipped and additional FDA Medication Guides as required.       DISEASE/MEDICATION-SPECIFIC INFORMATION        For patients on injectable medications: Patient currently has 1 doses left.  Next injection is scheduled for 4/15.    SPECIALTY MEDICATION ADHERENCE     Medication Adherence    Patient reported X missed doses in the last month: 0  Specialty Medication: Benlysta  Patient is on additional specialty medications: No  Patient is on more than two specialty medications: No  Any gaps in refill history greater than 2 weeks in the last 3 months: no  Demonstrates understanding of importance of adherence: yes  Informant: patient              Were doses missed due to medication being on hold? No    Benlysta 200mg /ml: Patient has 7 days of medication on hand    REFERRAL TO PHARMACIST     Referral to the pharmacist: Not needed      [facility]     Shipping address confirmed in Epic. Delivery Scheduled: Yes, Expected medication delivery date: 4/19.     Medication will be delivered via UPS to the prescription address in Epic WAM.    Tonya Boyer   [facility] Specialty Technician

## 2020-06-29 MED FILL — BENLYSTA 200 MG/ML SUBCUTANEOUS SYRINGE: 28 days supply | Qty: 4 | Fill #3

## 2020-07-23 NOTE — Unmapped (Signed)
[facility] Clinical Assessment & Refill Coordination Note    Harris Kistler, DOB: 1965-11-11  Phone: [phone] (home)     All above HIPAA information was verified with patient.     Was a Nurse, learning disability used for this call? No    Specialty Medication(s):   Inflammatory Disorders: Benlysta     Current Outpatient Medications   Medication Sig Dispense Refill   ??? belimumab (BENLYSTA) 200 mg/mL Syrg Inject the contents of 1 syringe (200mg ) under the skin once every 7 days. 12 mL 3   ??? dexamethasone (DECADRON) 10 mg/mL injection Infuse into a venous catheter. Dose unknown, every 6-8 weeks. Injections. Given with lidocaine and marcaine.     ??? DULoxetine (CYMBALTA) 20 MG capsule Take 20 mg by mouth daily.  0   ??? empty container Misc Use as directed 1 each 2   ??? empty container Misc Use as directed 1 each 2   ??? hydrOXYchloroQUINE (PLAQUENIL) 200 mg tablet Take 1 tablet (200 mg total) by mouth daily. 90 tablet 3   ??? naproxen sodium (ALEVE) 220 MG tablet Take 220 mg by mouth continuous as needed for pain.       No current facility-administered medications for this visit.        Changes to medications: Ammi reports no changes at this time.    Allergies   Allergen Reactions   ??? Amoxicillin Hives   ??? Penicillins Hives   ??? Latex Other (See Comments)     Skin irritation   ??? Penicillin G    ??? Prednisone    ??? Latex, Natural Rubber Rash       Changes to allergies: No    SPECIALTY MEDICATION ADHERENCE     Benlysta 200mg /ml: 7 days of medicine on hand     Medication Adherence    Patient reported X missed doses in the last month: 0  Specialty Medication: Benlysta 200mg /ml          Specialty medication(s) dose(s) confirmed: Regimen is correct and unchanged.     Are there any concerns with adherence? No    Adherence counseling provided? Not needed    CLINICAL MANAGEMENT AND INTERVENTION      Clinical Benefit Assessment:    Do you feel the medicine is effective or helping your condition? Yes    Clinical Benefit counseling provided? Not needed    Adverse Effects Assessment:    Are you experiencing any side effects? No    Are you experiencing difficulty administering your medicine? No    Quality of Life Assessment:    Rheumatology:   Quality of Life    On a scale of 1 ??? 10 with 1 representing not at all and 10 representing completely ??? how has your rheumatologic condition affected your:  Daily pain level?: decline to answer  Ability to complete your regular daily tasks (prepare meals, get dressed, etc.)?: decline to answer  Ability to participate in social or family activities?: decline to answer         Have you discussed this with your provider? Not needed    Acute Infection Status:    Acute infections noted within Epic:  No active infections  Patient reported infection: None    Therapy Appropriateness:    Is therapy appropriate? Yes, therapy is appropriate and should be continued    DISEASE/MEDICATION-SPECIFIC INFORMATION      For patients on injectable medications: Patient currently has 1 doses left.  Next injection is scheduled for 07/24/2020.  PATIENT SPECIFIC NEEDS     - Does the patient have any physical, cognitive, or cultural barriers? No    - Is the patient high risk? No    - Does the patient require a Care Management Plan? No     - Does the patient require physician intervention or other additional services (i.e. nutrition, smoking cessation, social work)? No      SHIPPING     Specialty Medication(s) to be Shipped:   Inflammatory Disorders: Benlysta 200mg /ml    Other medication(s) to be shipped: No additional medications requested for fill at this time     Changes to insurance: No    Delivery Scheduled: Yes, Expected medication delivery date: 07/29/2020.     Medication will be delivered via UPS to the confirmed prescription address in [facility]    The patient will receive a drug information handout for each medication shipped and additional FDA Medication Guides as required.  Verified that patient has previously received a Conservation officer, historic buildings and a Surveyor, mining.    The patient or caregiver noted above participated in the development of this care plan and knows that they can request review of or adjustments to the care plan at any time.      All of the patient's questions and concerns have been addressed.    Karene Fry Giann Obara   [facility] Specialty Pharmacist

## 2020-07-28 MED FILL — BENLYSTA 200 MG/ML SUBCUTANEOUS SYRINGE: 28 days supply | Qty: 4 | Fill #4

## 2020-07-29 NOTE — Unmapped (Signed)
[facility] Clinical Intervention    Type of intervention: Medication shipping issue    Medication involved: Benlysta 200mg /ml    Problem identified: Medication arrived to patient with melted ice packs and all three indicators activated on the temp tag on the medication.  Benlysta is only good outside of refrigeration for 12 hours.    Intervention performed: I advised the patient not to use the doses that arrived too warm and that a replacement package will be sent to them for delivery via UPS on 07/31/2020.    Follow-up needed: none at this time    Approximate time spent: 10-15 minutes    Clinical evidence used to support intervention: Drug information resource    Lupita Shutter   [facility] Specialty Pharmacist

## 2020-07-30 MED FILL — BENLYSTA 200 MG/ML SUBCUTANEOUS SYRINGE: 28 days supply | Qty: 4 | Fill #5

## 2020-08-07 DIAGNOSIS — M329 Systemic lupus erythematosus, unspecified: Principal | ICD-10-CM

## 2020-08-07 DIAGNOSIS — R0789 Other chest pain: Principal | ICD-10-CM

## 2020-08-14 ENCOUNTER — Ambulatory Visit (HOSPITAL_COMMUNITY)
Admission: RE | Admit: 2020-08-14 | Discharge: 2020-08-14 | Disposition: A | Discharge status: Home / Self Care | Payer: Medicare PPO | Source: Ambulatory Visit | Attending: Interventional Radiology | Admitting: Interventional Radiology

## 2020-08-14 DIAGNOSIS — Z87448 Personal history of other diseases of urinary system: Secondary | ICD-10-CM | POA: Diagnosis not present

## 2020-08-14 DIAGNOSIS — M545 Low back pain, unspecified: Secondary | ICD-10-CM | POA: Diagnosis not present

## 2020-08-14 DIAGNOSIS — D1809 Hemangioma of other sites: Secondary | ICD-10-CM | POA: Diagnosis not present

## 2020-08-14 DIAGNOSIS — D1771 Benign lipomatous neoplasm of kidney: Secondary | ICD-10-CM | POA: Insufficient documentation

## 2020-08-14 LAB — POCT I-STAT CREATININE: Creatinine, Ser: 1 mg/dL (ref 0.44–1.00)

## 2020-08-14 MED ORDER — IOHEXOL 300 MG/ML  SOLN
100.0000 mL | Freq: Once | INTRAMUSCULAR | Status: AC | PRN
  Administered 2020-08-14: 100 mL via INTRAVENOUS

## 2020-08-18 DIAGNOSIS — I10 Essential (primary) hypertension: Secondary | ICD-10-CM | POA: Diagnosis not present

## 2020-08-18 DIAGNOSIS — E663 Overweight: Secondary | ICD-10-CM | POA: Diagnosis not present

## 2020-08-18 DIAGNOSIS — Z1389 Encounter for screening for other disorder: Secondary | ICD-10-CM | POA: Diagnosis not present

## 2020-08-18 DIAGNOSIS — E782 Mixed hyperlipidemia: Secondary | ICD-10-CM | POA: Diagnosis not present

## 2020-08-18 DIAGNOSIS — Z0001 Encounter for general adult medical examination with abnormal findings: Secondary | ICD-10-CM | POA: Diagnosis not present

## 2020-08-18 DIAGNOSIS — E7849 Other hyperlipidemia: Secondary | ICD-10-CM | POA: Diagnosis not present

## 2020-08-18 DIAGNOSIS — G43909 Migraine, unspecified, not intractable, without status migrainosus: Secondary | ICD-10-CM | POA: Diagnosis not present

## 2020-08-18 DIAGNOSIS — M329 Systemic lupus erythematosus, unspecified: Secondary | ICD-10-CM | POA: Diagnosis not present

## 2020-08-18 DIAGNOSIS — Z1331 Encounter for screening for depression: Secondary | ICD-10-CM | POA: Diagnosis not present

## 2020-08-18 DIAGNOSIS — F419 Anxiety disorder, unspecified: Secondary | ICD-10-CM | POA: Diagnosis not present

## 2020-08-18 DIAGNOSIS — E559 Vitamin D deficiency, unspecified: Secondary | ICD-10-CM | POA: Diagnosis not present

## 2020-08-18 DIAGNOSIS — Z6826 Body mass index (BMI) 26.0-26.9, adult: Secondary | ICD-10-CM | POA: Diagnosis not present

## 2020-08-19 NOTE — Unmapped (Signed)
Pt left VM stating she would like a few days supply of HCQ sent to a local pharmacy as her mail order pharmacy is going to be late getting her medications to her.     Returned call, no answer, left message on self identified VM.

## 2020-08-20 NOTE — Unmapped (Signed)
Attempted to call pt again. No answer, left VM.

## 2020-08-21 DIAGNOSIS — M3500 Sicca syndrome, unspecified: Secondary | ICD-10-CM | POA: Diagnosis not present

## 2020-08-21 DIAGNOSIS — M329 Systemic lupus erythematosus, unspecified: Secondary | ICD-10-CM | POA: Diagnosis not present

## 2020-08-21 DIAGNOSIS — E042 Nontoxic multinodular goiter: Secondary | ICD-10-CM | POA: Diagnosis not present

## 2020-08-21 NOTE — Unmapped (Signed)
[facility] Refill Coordination Note    Specialty Medication(s) to be Shipped:   Inflammatory Disorders: Benlysta    Other medication(s) to be shipped: No additional medications requested for fill at this time     Tonya Boyer, DOB: March 09, 1966  Phone: [phone] (home)       All above HIPAA information was verified with patient.     Was a Nurse, learning disability used for this call? No    Completed refill call assessment today to schedule patient's medication shipment from the [facility] [phone]).  All relevant notes have been reviewed.     Specialty medication(s) and dose(s) confirmed: Regimen is correct and unchanged.   Changes to medications: [name] reports no changes at this time.  Changes to insurance: No  New side effects reported not previously addressed with a pharmacist or physician: None reported  Questions for the pharmacist: No    Confirmed patient received a Conservation officer, historic buildings and a Surveyor, mining with first shipment. The patient will receive a drug information handout for each medication shipped and additional FDA Medication Guides as required.       DISEASE/MEDICATION-SPECIFIC INFORMATION        For patients on injectable medications: Patient currently has 0 doses left.  Next injection is scheduled for 6/17.    SPECIALTY MEDICATION ADHERENCE     Medication Adherence    Patient reported X missed doses in the last month: 0  Specialty Medication: Benlysta  Patient is on additional specialty medications: No  Patient is on more than two specialty medications: No  Any gaps in refill history greater than 2 weeks in the last 3 months: no  Demonstrates understanding of importance of adherence: yes  Informant: patient              Were doses missed due to medication being on hold? No    Benlysta 200mg /ml: Patient has 0 days of medication on hand    REFERRAL TO PHARMACIST     Referral to the pharmacist: Not needed      [facility]     Shipping address confirmed in Epic. Delivery Scheduled: Yes, Expected medication delivery date: 6/15.     Medication will be delivered via UPS to the prescription address in Epic WAM.    [name]   [facility] Specialty Technician

## 2020-08-25 ENCOUNTER — Ambulatory Visit
Admission: RE | Admit: 2020-08-25 | Discharge: 2020-08-25 | Disposition: A | Discharge status: Home / Self Care | Payer: Medicare PPO | Source: Ambulatory Visit | Attending: Interventional Radiology | Admitting: Interventional Radiology

## 2020-08-25 ENCOUNTER — Encounter: Payer: Self-pay | Admitting: *Deleted

## 2020-08-25 DIAGNOSIS — D1771 Benign lipomatous neoplasm of kidney: Secondary | ICD-10-CM | POA: Diagnosis not present

## 2020-08-25 HISTORY — PX: IR RADIOLOGIST EVAL & MGMT: IMG5224

## 2020-08-25 MED FILL — BENLYSTA 200 MG/ML SUBCUTANEOUS SYRINGE: 28 days supply | Qty: 4 | Fill #6

## 2020-08-25 NOTE — Progress Notes (Signed)
Patient ID: Dawn Marsh, female   DOB: 11/09/1965, 55 y.o.   MRN: [id number]       Chief Complaint:  Left renal angiomyolipoma, continued outpatient surveillance  Referring Physician(s): McKenzie  History of Present Illness: Dawn Marsh is a 55 y.o. 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  She has a chronic history of SLE and leukopenia.  Overall she remains asymptomatic.  No flank or abdominal pain.  No fever, dysuria or hematuria.  No recent illness.  She has had surveillance imaging at [facility] on 08/14/2020.  This confirms enlargement of the exophytic left inferior renal angiomyolipoma now measuring just greater than 4 cm.  No signs of intralesional hemorrhage or retroperitoneal hemorrhage.  No other acute renal abnormality.  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   Hypertension    Left renal mass    Leukopenia 12/27/2012   Lupus ([place])    Migraine    Nontoxic simple goitre 12/27/2012   Rhinitis, allergic    seasonal   Seizures (HCC)    migraines   SLE (systemic lupus erythematosus) ([place]) 12/27/2012   Vertigo     Past Surgical History:  Procedure Laterality Date   ABDOMINAL HYSTERECTOMY     ABDOMINAL HYSTERECTOMY  2011   IR RADIOLOGIST EVAL & MGMT  01/12/2017   IR RADIOLOGIST EVAL & MGMT  02/07/2017   IR RADIOLOGIST EVAL & MGMT  12/07/2017   IR RADIOLOGIST EVAL & MGMT  12/26/2018   IR RADIOLOGIST EVAL & MGMT  06/25/2019   IR RADIOLOGIST EVAL & MGMT  08/25/2020   KIDNEY SURGERY      MYOMECTOMY ABDOMINAL APPROACH     myomectomy x 2 (prior to the hysterectomy)  2002 and 2006   TUMOR REMOVAL      Allergies: Amoxicillin, Amoxil [amoxicillin trihydrate], Penicillins, Penicillins, and Latex  Medications: Prior to Admission medications   Medication Sig Start Date End Date Taking? Authorizing Provider  belimumab (BENLYSTA) 120 MG SOLR injection Inject 1 mL into the vein once a week. 200 mg weekly    [provider]  dexamethasone (DECADRON) 10 MG/ML injection Inject 4 mLs into the vein every 6 (six) weeks.     [provider]  DULoxetine (CYMBALTA) 20 MG capsule Take 20 mg by mouth daily. 05/05/16   [provider]  hydroxychloroquine (PLAQUENIL) 200 MG tablet Take 200 mg by mouth daily. 09/18/18   [provider]  naproxen sodium (ALEVE) 220 MG tablet Take 220 mg by mouth daily as needed.    [provider]  triamcinolone cream (KENALOG) 0.1 % Apply 1 application topically daily as needed. 08/20/18   [provider]     No family history on file.  Social History   Socioeconomic History   Marital status: Divorced    Spouse name: Not on file   Number of children: Not on file   Years of education: Not on file   Highest education level: Not on file  Occupational  History   Not on file  Tobacco Use   Smoking status: Never   Smokeless tobacco: Never  Substance and Sexual Activity   Alcohol use: Yes    Comment: occas-maybe once a month   Drug use: No   Sexual activity: Not on file  Other Topics Concern   Not on file  Social History Narrative   ** Merged History Encounter **       Social Determinants of Health   Financial Resource Strain: Not on file  Food Insecurity: Not on file  Transportation Needs: Not on file  Physical Activity: Not on file  Stress: Not on file  Social Connections: Not on file     Review of Systems: A 12 point ROS discussed and pertinent positives are indicated in the HPI above.   All other systems are negative.  Review of Systems  Vital Signs: BP (!) 143/83 (BP Location: Right Arm)   Pulse 98   SpO2 100%   Physical Exam Constitutional:      General: She is not in acute distress.    Appearance: She is not toxic-appearing or diaphoretic.  Eyes:     General: No scleral icterus.    Conjunctiva/sclera: Conjunctivae normal.  Cardiovascular:     Rate and Rhythm: Normal rate and regular rhythm.     Heart sounds: Normal heart sounds. No murmur heard. Pulmonary:     Effort: Pulmonary effort is normal.     Breath sounds: Normal breath sounds.  Abdominal:     General: Bowel sounds are normal. There is no distension.     Tenderness: There is no abdominal tenderness.  Musculoskeletal:        General: No deformity.  Skin:    General: Skin is warm and dry.     Coloration: Skin is not jaundiced.  Neurological:     General: No focal deficit present.     Mental Status: Mental status is at baseline.  Psychiatric:        Mood and Affect: Mood normal.    Mallampati Score:     Imaging: CT ABDOMEN W WO CONTRAST  Result Date: 08/14/2020 CLINICAL DATA:  Left renal mass with cryoablation 2013, complains of left sided lower back pain. Renal angiomyolipoma. EXAM: CT ABDOMEN WITHOUT AND WITH CONTRAST TECHNIQUE: Multidetector CT imaging of the abdomen was performed following the standard protocol before and following the bolus administration of intravenous contrast. CONTRAST:  156mL OMNIPAQUE IOHEXOL 300 MG/ML  SOLN COMPARISON:  June 21, 2019 FINDINGS: Lower chest: No acute abnormality. Hepatobiliary: Stable hepatic hemangiomas measuring 2 cm in the left lobe of the liver on image 10/6 and 1.3 cm in the right lobe of the liver on image 23/6. Gallbladder is grossly unremarkable. No biliary ductal dilation. Pancreas: Unremarkable. No pancreatic ductal dilatation or surrounding inflammatory changes. Spleen: Normal in size without focal abnormality. Adrenals/Urinary Tract: Bilateral  adrenal glands are unremarkable. No hydronephrosis. Right kidneys unremarkable. Exophytic heterogeneous macroscopic fat containing lesion has increased in size now measuring 4.0 x 3.8 x 3.6 cm on image 51/6 and 52/9 previously measuring 3 x 3 x 2.8 cm. Stable post treatment change in the posterior left interpolar region. Stomach/Bowel: The stomach is grossly unremarkable. No pathologic dilation or evidence of inflammation involving the loops of bowel visualized within the abdomen. Vascular/Lymphatic: No significant vascular findings are present. No enlarged abdominal lymph nodes. Other: No abdominal ascites. Musculoskeletal: No aggressive lytic or blastic lesion of bone. IMPRESSION: 1. Interval increase in size of the left renal angiomyolipoma  now measuring 4 cm in greatest dimension. 2. Stable post treatment change in the posterior left interpolar region. 3. Stable hepatic hemangiomas. Electronically Signed   By: Dahlia Bailiff MD   On: 08/14/2020 14:50   IR Radiologist Eval & Mgmt  Result Date: 08/25/2020 Please refer to notes tab for details about interventional procedure. (Op Note)   Labs:  CBC: No results for input(s): WBC, HGB, HCT, PLT in the last 8760 hours.  COAGS: No results for input(s): INR, APTT in the last 8760 hours.  BMP: Recent Labs    08/14/20 0822  CREATININE 1.00    LIVER FUNCTION TESTS: No results for input(s): BILITOT, AST, ALT, ALKPHOS, PROT, ALBUMIN in the last 8760 hours.  TUMOR MARKERS: No results for input(s): AFPTM, CEA, CA199, CHROMGRNA in the last 8760 hours.  Assessment and Plan:  Continued slow enlargement of an asymptomatic left kidney lower pole exophytic angiomyolipoma.  Dating back to 2018, the lesion measured 2.2 cm, 3.4 cm in 2020, and now just greater than 4 cm in 2022.  No CT signs of complication including internal lesion hemorrhage or retroperitoneal spontaneous hemorrhage.  No other acute renal abnormality.  Today, we reviewed the embolization  treatment option in detail including the procedure, risk, benefits, alternatives, expected goals and outcomes.  She understands that the bleeding risk does increase when angiomyolipomas are greater than 4 cm.  This is usually the indicator for elective embolization treatment.  After discussion, she would like to proceed with elective treatment in the next 3 months.  Plan: Scheduled for elective left renal angioembolization for the greater than 4 cm lower pole angiomyolipoma.  Plan to schedule this in the next 3 months at [facility].   Electronically Signed: Greggory Keen 08/25/2020, 1:41 PM   I spent a total of    40 Minutes in face to face in clinical consultation, greater than 50% of which was counseling/coordinating care for this patient with a greater than 4 cm left lower pole angiomyolipoma

## 2020-09-10 DIAGNOSIS — G518 Other disorders of facial nerve: Secondary | ICD-10-CM | POA: Diagnosis not present

## 2020-09-10 DIAGNOSIS — M542 Cervicalgia: Secondary | ICD-10-CM | POA: Diagnosis not present

## 2020-09-10 DIAGNOSIS — G43719 Chronic migraine without aura, intractable, without status migrainosus: Secondary | ICD-10-CM | POA: Diagnosis not present

## 2020-09-10 DIAGNOSIS — M791 Myalgia, unspecified site: Secondary | ICD-10-CM | POA: Diagnosis not present

## 2020-09-17 MED ORDER — EMPTY CONTAINER
2 refills | 0 days
Start: 2020-09-17 — End: ?

## 2020-09-17 NOTE — Unmapped (Signed)
[facility] Refill Coordination Note    Specialty Medication(s) to be Shipped:   Inflammatory Disorders: Benlysta    Other medication(s) to be shipped: No additional medications requested for fill at this time     Tonya Boyer, DOB: 11-14-1965  Phone: [phone] (home)       All above HIPAA information was verified with patient.     Was a Nurse, learning disability used for this call? No    Completed refill call assessment today to schedule patient's medication shipment from the [facility] [phone]).  All relevant notes have been reviewed.     Specialty medication(s) and dose(s) confirmed: Regimen is correct and unchanged.   Changes to medications: Tonya Boyer reports no changes at this time.  Changes to insurance: No  New side effects reported not previously addressed with a pharmacist or physician: None reported  Questions for the pharmacist: No    Confirmed patient received a Conservation officer, historic buildings and a Surveyor, mining with first shipment. The patient will receive a drug information handout for each medication shipped and additional FDA Medication Guides as required.       DISEASE/MEDICATION-SPECIFIC INFORMATION        For patients on injectable medications: Patient currently has 1 doses left.  Next injection is scheduled for 7/8.    SPECIALTY MEDICATION ADHERENCE     Medication Adherence    Patient reported X missed doses in the last month: 0  Specialty Medication: Benlysta  Patient is on additional specialty medications: No  Patient is on more than two specialty medications: No  Any gaps in refill history greater than 2 weeks in the last 3 months: no  Demonstrates understanding of importance of adherence: yes  Informant: patient              Were doses missed due to medication being on hold? No    Benlysta 200mg /ml: Patient has 7 days of medication on hand     REFERRAL TO PHARMACIST     Referral to the pharmacist: Not needed      [facility]     Shipping address confirmed in Epic. Delivery Scheduled: Yes, Expected medication delivery date: 7/13.     Medication will be delivered via UPS to the prescription address in Epic WAM.    Tonya Boyer   [facility] Specialty Technician

## 2020-09-22 MED FILL — EMPTY CONTAINER: 120 days supply | Qty: 1 | Fill #0

## 2020-09-22 MED FILL — BENLYSTA 200 MG/ML SUBCUTANEOUS SYRINGE: 28 days supply | Qty: 4 | Fill #7

## 2020-10-13 ENCOUNTER — Telehealth (HOSPITAL_COMMUNITY): Payer: Self-pay | Admitting: Radiology

## 2020-10-13 DIAGNOSIS — D485 Neoplasm of uncertain behavior of skin: Secondary | ICD-10-CM | POA: Diagnosis not present

## 2020-10-13 DIAGNOSIS — L720 Epidermal cyst: Secondary | ICD-10-CM | POA: Diagnosis not present

## 2020-10-13 DIAGNOSIS — D1801 Hemangioma of skin and subcutaneous tissue: Secondary | ICD-10-CM | POA: Diagnosis not present

## 2020-10-13 NOTE — Telephone Encounter (Signed)
Returned pt's call about scheduling her procedure with Dr. Annamaria Boots. No answer, no VM. JM

## 2020-10-15 NOTE — Unmapped (Signed)
[facility] Refill Coordination Note    Specialty Medication(s) to be Shipped:   Inflammatory Disorders: Benlysta    Other medication(s) to be shipped: No additional medications requested for fill at this time     Tonya Boyer, DOB: 1965-09-29  Phone: [phone] (home)       All above HIPAA information was verified with patient.     Was a Nurse, learning disability used for this call? No    Completed refill call assessment today to schedule patient's medication shipment from the [facility] [phone]).  All relevant notes have been reviewed.     Specialty medication(s) and dose(s) confirmed: Regimen is correct and unchanged.   Changes to medications: [name] reports no changes at this time.  Changes to insurance: No  New side effects reported not previously addressed with a pharmacist or physician: None reported  Questions for the pharmacist: No    Confirmed patient received a Conservation officer, historic buildings and a Surveyor, mining with first shipment. The patient will receive a drug information handout for each medication shipped and additional FDA Medication Guides as required.       DISEASE/MEDICATION-SPECIFIC INFORMATION        For patients on injectable medications: Patient currently has 1 doses left.  Next injection is scheduled for 8/5.    SPECIALTY MEDICATION ADHERENCE     Medication Adherence    Patient reported X missed doses in the last month: 0  Specialty Medication: Benlysta 200mg /ml              Were doses missed due to medication being on hold? No        REFERRAL TO PHARMACIST     Referral to the pharmacist: Not needed      [facility]     Shipping address confirmed in Epic.     Delivery Scheduled: Yes, Expected medication delivery date: 23.     Medication will be delivered via UPS to the prescription address in Epic WAM.    [name]   [facility] Specialty Technician

## 2020-10-19 MED FILL — BENLYSTA 200 MG/ML SUBCUTANEOUS SYRINGE: 28 days supply | Qty: 4 | Fill #8

## 2020-10-23 ENCOUNTER — Other Ambulatory Visit (HOSPITAL_COMMUNITY): Payer: Self-pay | Admitting: Interventional Radiology

## 2020-10-23 DIAGNOSIS — D1771 Benign lipomatous neoplasm of kidney: Secondary | ICD-10-CM

## 2020-11-11 ENCOUNTER — Other Ambulatory Visit: Payer: Self-pay | Admitting: Radiology

## 2020-11-11 NOTE — Unmapped (Signed)
[facility] Refill Coordination Note    Specialty Medication(s) to be Shipped:   Inflammatory Disorders: Benlysta    Other medication(s) to be shipped: No additional medications requested for fill at this time     Nikiyah Fackler, DOB: 1965/04/28  Phone: [phone] (home)       All above HIPAA information was verified with patient.     Was a Nurse, learning disability used for this call? No    Completed refill call assessment today to schedule patient's medication shipment from the [facility] [phone]).  All relevant notes have been reviewed.     Specialty medication(s) and dose(s) confirmed: Regimen is correct and unchanged.   Changes to medications: Forrestine reports no changes at this time.  Changes to insurance: No  New side effects reported not previously addressed with a pharmacist or physician: None reported  Questions for the pharmacist: No    Confirmed patient received a Conservation officer, historic buildings and a Surveyor, mining with first shipment. The patient will receive a drug information handout for each medication shipped and additional FDA Medication Guides as required.       DISEASE/MEDICATION-SPECIFIC INFORMATION        For patients on injectable medications: Patient currently has 1 doses left.  Next injection is scheduled for 11/13/20.    SPECIALTY MEDICATION ADHERENCE     Medication Adherence    Patient reported X missed doses in the last month: 0  Specialty Medication: Benlysta  Patient is on additional specialty medications: No  Patient is on more than two specialty medications: No  Any gaps in refill history greater than 2 weeks in the last 3 months: no  Demonstrates understanding of importance of adherence: yes  Informant: patient  Reliability of informant: reliable  Confirmed plan for next specialty medication refill: delivery by pharmacy  Refills needed for supportive medications: not needed              Were doses missed due to medication being on hold? No    BENLYSTA 200 mg/mL: 7 days of medicine on hand       REFERRAL TO PHARMACIST     Referral to the pharmacist: Not needed      [facility]     Shipping address confirmed in Epic.     Delivery Scheduled: Yes, Expected medication delivery date: 11/19/20.     Medication will be delivered via UPS to the prescription address in Epic WAM.    Yolonda Kida   [facility] Specialty Technician

## 2020-11-12 ENCOUNTER — Encounter (HOSPITAL_COMMUNITY): Payer: Self-pay

## 2020-11-12 ENCOUNTER — Other Ambulatory Visit (HOSPITAL_COMMUNITY): Payer: Self-pay | Admitting: Interventional Radiology

## 2020-11-12 ENCOUNTER — Ambulatory Visit (HOSPITAL_COMMUNITY)
Admission: RE | Admit: 2020-11-12 | Discharge: 2020-11-12 | Disposition: A | Discharge status: Home / Self Care | Payer: Medicare PPO | Source: Ambulatory Visit | Attending: Interventional Radiology | Admitting: Interventional Radiology

## 2020-11-12 ENCOUNTER — Other Ambulatory Visit: Payer: Self-pay

## 2020-11-12 DIAGNOSIS — Z79899 Other long term (current) drug therapy: Secondary | ICD-10-CM | POA: Diagnosis not present

## 2020-11-12 DIAGNOSIS — Z881 Allergy status to other antibiotic agents status: Secondary | ICD-10-CM | POA: Insufficient documentation

## 2020-11-12 DIAGNOSIS — I10 Essential (primary) hypertension: Secondary | ICD-10-CM | POA: Insufficient documentation

## 2020-11-12 DIAGNOSIS — F419 Anxiety disorder, unspecified: Secondary | ICD-10-CM | POA: Diagnosis not present

## 2020-11-12 DIAGNOSIS — Z91040 Latex allergy status: Secondary | ICD-10-CM | POA: Diagnosis not present

## 2020-11-12 DIAGNOSIS — D1771 Benign lipomatous neoplasm of kidney: Secondary | ICD-10-CM

## 2020-11-12 DIAGNOSIS — Z880 Allergy status to penicillin: Secondary | ICD-10-CM | POA: Insufficient documentation

## 2020-11-12 DIAGNOSIS — K219 Gastro-esophageal reflux disease without esophagitis: Secondary | ICD-10-CM | POA: Diagnosis not present

## 2020-11-12 HISTORY — PX: IR US GUIDE VASC ACCESS RIGHT: IMG2390

## 2020-11-12 HISTORY — PX: IR RENAL SELECTIVE  UNI INC S&I MOD SED: IMG654

## 2020-11-12 LAB — BASIC METABOLIC PANEL
Anion gap: 7 (ref 5–15)
BUN: 18 mg/dL (ref 6–20)
CO2: 24 mmol/L (ref 22–32)
Calcium: 9.3 mg/dL (ref 8.9–10.3)
Chloride: 107 mmol/L (ref 98–111)
Creatinine, Ser: 0.91 mg/dL (ref 0.44–1.00)
GFR, Estimated: >60 mL/min (ref >60)
Glucose, Bld: 93 mg/dL (ref 70–99)
Potassium: 3.5 mmol/L (ref 3.5–5.1)
Sodium: 138 mmol/L (ref 135–145)

## 2020-11-12 LAB — CBC
HCT: 38.2 % (ref 36.0–46.0)
Hemoglobin: 12.3 g/dL (ref 12.0–15.0)
MCH: 31.5 pg (ref 26.0–34.0)
MCHC: 32.2 g/dL (ref 30.0–36.0)
MCV: 97.7 fL (ref 80.0–100.0)
Platelets: 283 10*3/uL (ref 150–400)
RBC: 3.91 MIL/uL (ref 3.87–5.11)
RDW: 12.8 % (ref 11.5–15.5)
WBC: 3.6 10*3/uL — ABNORMAL LOW (ref 4.0–10.5)
nRBC: 0 % (ref 0.0–0.2)

## 2020-11-12 LAB — PROTIME-INR
INR: 1 (ref 0.8–1.2)
Prothrombin Time: 12.7 seconds (ref 11.4–15.2)

## 2020-11-12 MED ORDER — MIDAZOLAM HCL 2 MG/2ML IJ SOLN
INTRAMUSCULAR | Status: AC | PRN
  Administered 2020-11-12: 1 mg via INTRAVENOUS
  Administered 2020-11-12: 0.5 mg via INTRAVENOUS

## 2020-11-12 MED ORDER — IOHEXOL 240 MG/ML SOLN
50.0000 mL | Freq: Once | INTRAMUSCULAR | Status: AC | PRN
  Administered 2020-11-12: 20 mL via INTRA_ARTERIAL

## 2020-11-12 MED ORDER — SODIUM CHLORIDE 0.9 % IV SOLN: INTRAVENOUS | Status: DC

## 2020-11-12 MED ORDER — IOHEXOL 240 MG/ML SOLN
50.0000 mL | Freq: Once | INTRAMUSCULAR | Status: AC | PRN
  Administered 2020-11-12: 30 mL via INTRA_ARTERIAL

## 2020-11-12 MED ORDER — MIDAZOLAM HCL 2 MG/2ML IJ SOLN
INTRAMUSCULAR | Status: AC
  Filled 2020-11-12: qty 2

## 2020-11-12 MED ORDER — LIDOCAINE HCL 1 % IJ SOLN
INTRAMUSCULAR | Status: AC
  Filled 2020-11-12: qty 20

## 2020-11-12 MED ORDER — FENTANYL CITRATE (PF) 100 MCG/2ML IJ SOLN
INTRAMUSCULAR | Status: AC
  Filled 2020-11-12: qty 2

## 2020-11-12 MED ORDER — FENTANYL CITRATE (PF) 100 MCG/2ML IJ SOLN
INTRAMUSCULAR | Status: AC | PRN
  Administered 2020-11-12: 50 ug via INTRAVENOUS
  Administered 2020-11-12: 25 ug via INTRAVENOUS

## 2020-11-12 NOTE — H&P (Signed)
Chief Complaint: Patient was seen in consultation today for left renal angiomyolipoma  Referring Physician(s): Dr. Nicolette Bang  Supervising Physician: Daryll Brod  Patient Status: [facility] - Out-pt  History of Present Illness: Dawn Marsh is a 55 y.o. female with history of anxiety, GERD, HTN, lupus, and known small left renal angiomyolipoma which recent demonstration of enlargement to greater than 4 cm.  She is otherwise asymptomatic and there are no other concerning features or associated renal abnormality, however the persistent enlargement dating back to 2018 warrants intervention.  This was discussed with the the patient in consultation with Dr. Annamaria Boots 08/25/20 at time she indicated her desire to proceed with treatment.  She presents to [facility] for procedure today.   Dawn Marsh presents today in her usual state of health.  She has occasional flank pain, however is otherwise asymptomatic.  She has been NPO today.  She does not take blood thinners. She is agreeable to proceed.   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   Hypertension    Left renal mass    Leukopenia 12/27/2012   Lupus ([place])    Migraine    Nontoxic simple goitre 12/27/2012   Rhinitis, allergic    seasonal   Seizures (HCC)    migraines   SLE (systemic lupus erythematosus) ([place]) 12/27/2012   Vertigo     Past Surgical History:  Procedure Laterality Date   ABDOMINAL HYSTERECTOMY     ABDOMINAL HYSTERECTOMY  2011   IR RADIOLOGIST EVAL & MGMT  01/12/2017   IR RADIOLOGIST EVAL & MGMT  02/07/2017   IR RADIOLOGIST EVAL & MGMT  12/07/2017   IR RADIOLOGIST EVAL & MGMT  12/26/2018   IR  RADIOLOGIST EVAL & MGMT  06/25/2019   IR RADIOLOGIST EVAL & MGMT  08/25/2020   KIDNEY SURGERY     MYOMECTOMY ABDOMINAL APPROACH     myomectomy x 2 (prior to the hysterectomy)  2002 and 2006   TUMOR REMOVAL      Allergies: Amoxicillin, Amoxil [amoxicillin trihydrate], Penicillins, and Latex  Medications: Prior to Admission medications   Medication Sig Start Date End Date Taking? Authorizing Provider  Belimumab (BENLYSTA) 200 MG/ML SOAJ Inject 200 mg into the skin every Friday.   Yes [provider]  Calcium Carbonate-Vitamin D (CALCIUM 600+D PO) Take 1 tablet by mouth daily.   Yes [provider]  Cholecalciferol (VITAMIN D) 50 MCG (2000 UT) tablet Take 2,000 Units by mouth daily.   Yes [provider]  DULoxetine (CYMBALTA) 30 MG capsule Take 30 mg by mouth daily.   Yes [provider]  hydroxychloroquine (PLAQUENIL) 200 MG tablet Take 200 mg by mouth daily.   Yes [provider]  triamcinolone ointment (KENALOG) 0.1 % Apply 1 application topically 2 (two) times daily as needed (rash).   Yes [provider]  dexamethasone (DECADRON) 10 MG/ML injection Inject 10 mg into the vein every 8 (eight) weeks.    [provider]  naproxen sodium (ALEVE) 220 MG tablet Take 220 mg by mouth daily as needed (pain).    [provider]     No family history on file.  Social  History   Socioeconomic History   Marital status: Divorced    Spouse name: Not on file   Number of children: Not on file   Years of education: Not on file   Highest education level: Not on file  Occupational History   Not on file  Tobacco Use   Smoking status: Never   Smokeless tobacco: Never  Substance and Sexual Activity   Alcohol use: Yes    Comment: occas-maybe once a month   Drug use: No   Sexual activity: Not on file  Other Topics Concern   Not on file  Social History Narrative   ** Merged History Encounter **       Social Determinants of  Health   Financial Resource Strain: Not on file  Food Insecurity: Not on file  Transportation Needs: Not on file  Physical Activity: Not on file  Stress: Not on file  Social Connections: Not on file     Review of Systems: A 12 point ROS discussed and pertinent positives are indicated in the HPI above.  All other systems are negative.  Review of Systems  Constitutional:  Negative for fatigue and fever.  Respiratory:  Negative for cough and shortness of breath.   Gastrointestinal:  Negative for abdominal pain, diarrhea and nausea.  Genitourinary:  Positive for flank pain (occasional).  Psychiatric/Behavioral:  Negative for behavioral problems and confusion.    Vital Signs: BP (!) 155/82   Pulse 75   Temp 97.8 F (36.6 C) (Oral)   Resp 16   Ht '5\' 5"'  (1.651 m)   Wt 143 lb (64.9 kg)   SpO2 100%   BMI 23.80 kg/m   Physical Exam Vitals and nursing note reviewed.  Constitutional:      General: She is not in acute distress.    Appearance: Normal appearance. She is not ill-appearing.  HENT:     Mouth/Throat:     Mouth: Mucous membranes are moist.     Pharynx: Oropharynx is clear.  Cardiovascular:     Rate and Rhythm: Normal rate and regular rhythm.  Pulmonary:     Effort: Pulmonary effort is normal. No respiratory distress.     Breath sounds: Normal breath sounds.  Skin:    General: Skin is warm and dry.  Neurological:     General: No focal deficit present.     Mental Status: She is alert and oriented to person, place, and time. Mental status is at baseline.  Psychiatric:        Mood and Affect: Mood normal.        Behavior: Behavior normal.        Thought Content: Thought content normal.        Judgment: Judgment normal.         Imaging: No results found.  Labs:  CBC: Recent Labs    11/12/20 0619  WBC 3.6*  HGB 12.3  HCT 38.2  PLT 283    COAGS: Recent Labs    11/12/20 0619  INR 1.0    BMP: Recent Labs    08/14/20 0822 11/12/20 0619  NA   --  138  K  --  3.5  CL  --  107  CO2  --  24  GLUCOSE  --  93  BUN  --  18  CALCIUM  --  9.3  CREATININE 1.00 0.91  GFRNONAA  --  >60    LIVER FUNCTION TESTS: No results for input(s): BILITOT, AST, ALT, ALKPHOS, PROT, ALBUMIN in  the last 8760 hours.  TUMOR MARKERS: No results for input(s): AFPTM, CEA, CA199, CHROMGRNA in the last 8760 hours.  Assessment and Plan: Patient with past medical history of left renal angiomyolipoma presents with complaint of persistent enlargement.  Case reviewed by Dr. Annamaria Boots who approves patient for procedure and has met with her in consultation to discuss.  Patient presents today in their usual state of health.  She has been NPO and is not currently on blood thinners.   The Risks and benefits of embolization were discussed with the patient including, but not limited to bleeding, infection, vascular injury, post operative pain, or contrast induced renal failure.  This procedure involves the use of X-rays and because of the nature of the planned procedure, it is possible that we will have prolonged use of X-ray fluoroscopy.  Potential radiation risks to you include (but are not limited to) the following: - A slightly elevated risk for cancer several years later in life. This risk is typically less than 0.5% percent. This risk is low in comparison to the normal incidence of human cancer, which is 33% for women and 50% for men according to the [place]. - Radiation induced injury can include skin redness, resembling a rash, tissue breakdown / ulcers and hair loss (which can be temporary or permanent).   The likelihood of either of these occurring depends on the difficulty of the procedure and whether you are sensitive to radiation due to previous procedures, disease, or genetic conditions.   IF your procedure requires a prolonged use of radiation, you will be notified and given written instructions for further action.  It is your  responsibility to monitor the irradiated area for the 2 weeks following the procedure and to notify your physician if you are concerned that you have suffered a radiation induced injury.    All of the patient's questions were answered, patient is agreeable to proceed. Consent signed and in chart.  Thank you for this interesting consult.  I greatly enjoyed meeting Dawn Marsh and look forward to participating in their care.  A copy of this report was sent to the requesting provider on this date.  Electronically Signed: Docia Barrier, PA 11/12/2020, 7:20 AM   I spent a total of    25 Minutes in face to face in clinical consultation, greater than 50% of which was counseling/coordinating care for left renal angiomyolipoma.

## 2020-11-12 NOTE — Progress Notes (Signed)
Patient was given discharge instructions. She verbalized understanding. 

## 2020-11-12 NOTE — Sedation Documentation (Signed)
Pt transported to Short stay room 19 via stretcher accompanied by RN and Transport. Vivian RN at bedside to receive pt. Handoff completed. Right groin site level 0 and bilateral lower distal pulses present via doppler. Pt awake alert and oriented. No s/s of distress at this time.

## 2020-11-12 NOTE — Procedures (Signed)
Interventional Radiology Procedure Note  Procedure: left renal angio, unsuccessful micro embo    Complications: None  Estimated Blood Loss:  min  Findings: Left renal AML off the lower pole with very tiny intralesion branches from the lower pole vasculature.  Unsuccessful micro cath of any dominant feeder.      Tamera Punt, MD

## 2020-11-18 MED FILL — BENLYSTA 200 MG/ML SUBCUTANEOUS SYRINGE: 28 days supply | Qty: 4 | Fill #9

## 2020-11-23 DIAGNOSIS — G43719 Chronic migraine without aura, intractable, without status migrainosus: Secondary | ICD-10-CM | POA: Diagnosis not present

## 2020-11-23 DIAGNOSIS — G518 Other disorders of facial nerve: Secondary | ICD-10-CM | POA: Diagnosis not present

## 2020-11-23 DIAGNOSIS — M791 Myalgia, unspecified site: Secondary | ICD-10-CM | POA: Diagnosis not present

## 2020-11-23 DIAGNOSIS — M542 Cervicalgia: Secondary | ICD-10-CM | POA: Diagnosis not present

## 2020-12-07 DIAGNOSIS — M329 Systemic lupus erythematosus, unspecified: Principal | ICD-10-CM

## 2020-12-11 NOTE — Unmapped (Signed)
[facility] Refill Coordination Note    Specialty Medication(s) to be Shipped:   Inflammatory Disorders: Benlysta    Other medication(s) to be shipped: No additional medications requested for fill at this time     Tonya Boyer, DOB: [birth year]  Phone: [phone] (home)       All above HIPAA information was verified with patient.     Was a Nurse, learning disability used for this call? No    Completed refill call assessment today to schedule patient's medication shipment from the [facility] [phone]).  All relevant notes have been reviewed.     Specialty medication(s) and dose(s) confirmed: Regimen is correct and unchanged.   Changes to medications: Tonya Boyer reports no changes at this time.  Changes to insurance: No  New side effects reported not previously addressed with a pharmacist or physician: None reported  Questions for the pharmacist: No    Confirmed patient received a Conservation officer, historic buildings and a Surveyor, mining with first shipment. The patient will receive a drug information handout for each medication shipped and additional FDA Medication Guides as required.       DISEASE/MEDICATION-SPECIFIC INFORMATION        For patients on injectable medications: Patient currently has 0 doses left.  Next injection is scheduled for 10/7.    SPECIALTY MEDICATION ADHERENCE     Medication Adherence    Patient reported X missed doses in the last month: 0  Specialty Medication: Benlysta  Patient is on additional specialty medications: No  Patient is on more than two specialty medications: No  Any gaps in refill history greater than 2 weeks in the last 3 months: no  Demonstrates understanding of importance of adherence: yes  Informant: patient              Were doses missed due to medication being on hold? No    Benlysta 200mg /ml: Patient has 0 days of medication on hand    REFERRAL TO PHARMACIST     Referral to the pharmacist: Not needed      [facility]     Shipping address confirmed in Epic. Delivery Scheduled: Yes, Expected medication delivery date: 10/5.     Medication will be delivered via UPS to the prescription address in Epic WAM.    Tonya Boyer   [facility] Specialty Technician

## 2020-12-15 MED FILL — BENLYSTA 200 MG/ML SUBCUTANEOUS SYRINGE: 28 days supply | Qty: 4 | Fill #10

## 2020-12-24 NOTE — Unmapped (Signed)
Last clinic visit: Jan 2022      Accompanied by: alone     Chief complaint: follow-up systemic lupus erythematosus (SLE)      History of Present Illness:     HPI:  Tonya Boyer is a 55 y.o. female with a history of SLE (dx 2014) characerized by fatigue, leukopenia, arthralgias, pleurisy, reactive LAD, fatigue, brain fog, +ANA, +dsDNA, +Ro, +RNP, low titer aCL IgM who presents for follow-up visit of SLE. In brief summary,??at time of??diagnosis in 2014 the patient was started on MTX [place] with significant improvement in arthralgia, brain fog, fatigue but DC'd in 04/2014 2/2 mild elevation in LFTs and leukopenia (ANC <1),??leflunomide??then??tried without improvement in joint pain. G6PD markedly low, thus plaquenil not started due to concern for hemolytic anemia. Has not been able to tolerate steroids (even low dose) 2/2 anxiety. Trial of azathioprine started in 06/2014 with close monitoring of counts 2/2 low normal TPMT but DC'd 2/2 worsening leukopenia (WBC 2.4, ANC 1) in beginning of 11/2014. In 12/2014, the patient was started on belimumab. Of note, the patient has had persistently enlarged LAD - work up including PET CT with hypermetabolic LAD in neck, chest, pelvis. SLE vs. Lymphoma. Had bx 10/2014 by ENT of L neck node - reactive hyperplasia, no evidence of malignancy. Started plaquenil Jan 2020.  ??  In 07/2015, the patient was seen by Dr. Janee Morn for neurocognitive testing which revealed mild impairment consistent with a patient with SLE and was felt that fatigue was playing a role in her reduced cognitive efficiency. Recommended going to counseling as well due to adjustment to disability and depression/anxiety. TTE 07/2015 also normal. Sent for MRI brain/MRA head and neck which were normal 10/2015.    Current Treatment:  - Plaquenil 200 mg daily.   - Benlysta 200 mg sq weekly     Interval History:  - Overall feeling well.  - Continues to have some intermittent chest pain. Feels accustomed to it by now, more mild recently. Has appt coming up with pulm for evaluation. Previously had cardiology evaluation.   - Joint pain better, occasional pain.  No AM stiffness.  No joint swelling.    - Some discomfort Left lower leg.    - Minimal dry eyes and intermittent dry mouth.    - No alopecia, rash, photosensitivity, oral or nasal ulcers, or Raynaud's phenomenon.   - No fevers, chills, nausea, cough, rash, or other concerns. No recent infections or antibiotic use.      Record review:  I have reviewed the patient's allergies, medications, pertinent past medical, surgical, social and family history and have updated in Epic where appropriate. I have also reviewed the pertinent past medical records including notes, labs, imaging tests in the medical record and in Care Everywhere.     Objective    Physical Exam:  Vitals:    01/01/21 1137   BP: 147/95   BP Site: L Arm   BP Position: Sitting   Pulse: 77   Temp: 36.2 ??C (97.2 ??F)   TempSrc: Temporal   SpO2: 99%   Weight: 64.7 kg (142 lb 9.6 oz)     Body mass index is 23.73 kg/m??.  GENERAL: The patient is well appearing, in no acute distress. Ambulates around exam room easily and climbs on exam table without difficulty.  SKIN: No rash.   EYES: EOMI, PERRL. Sclera anicteric, conjunctiva non- injected.   ENT: mucus membranes moist.   Neck: supple, no cervical lymphadenopathy  Respiratory: Breathing non-labored, CTA bilaterally  CV: Heart rate regular, no murmurs  GI: Abdomen soft, nontender, nondistended, no hepatosplenomegaly  VASCULAR: warm and well perfused extremities, no c/c/e.  NEURO: CN 2-12 grossly intact.   PSYCH: No depression or anxiety. Cooperative. Alert and oriented.   MUSCULOSKELETAL:   ?? Bilateral shoulders, elbows, wrists, hands, fingers:  No deformity, erythema, warmth, swelling, effusion, tenderness, or limited ROM.?? Able to curl all fingers. Prayer sign negative.   ?? Hips without limited ROM.??  ?? Bilateral knees, ankles, feet, toes: No deformity, erythema, warmth, swelling, effusion, tenderness, or limited ROM. MTP squeeze negative. Except: L lateral lower leg TTP over lateral peroneal tendons.       Assessment/Plan:     Tonya Boyer is a 55 y.o. female with??a history of??SLE (dx 2014) characerized by fatigue, leukopenia, arthralgia, pleurisy, reactive LAD, brain fog, +ANA, +dsDNA, +Ro, +RNP, low titer aCL IgM currently on belimumab SQ weekly who presents for follow up.  ??  Systemic lupus erythematosus (SLE): Overall, the patient's lupus is well-controlled on her current medication. Continues to have intermittent pains including anterior chest pain which she reports is improved compared to previously. Had echo previously in Dec 2019 which was unremarkable, no pericardial effusion. This may be due to costochondritis, unlikely due to SLE.   - Update lupus monitoring labs today.   - Continue Plaquenil 200 mg daily. (Refilled today.)   - Continue Benlysta weekly. (Refilled today.)     Left ankle pain: No prior preceding injury. May be due to tendonitis. Ankle sprain seems less likely with no prior injury.   - Recommend continuing to monitor. If persists or gets worse can refer to sports medicine.   ??  High risk medication monitoring:??Benlysta  - Patient is currently taking??Benlysta, an immunosuppressant medication that requires intensive monitoring. This monitoring was done today through history, physical, and/or lab testing.   - No recent infections or hospitalizations.   - Will check CBC and CMP with lupus monitoring labs today.     Long-term plaquenil use: Patient is currently taking plaquenil which requires intensive monitoring including regular eye exams due to risk for retinal toxicity.    - Plaquenil started: Jan 2020   - Last eye exam was: 12/28/2020 with Dr. Wynell Balloon MD [facility]) without toxicity from plaquenil on exam.      - Patient has the following risk factors for retinal toxicity from plaquenil: None     Immunization Counseling:  Flu: Reports completed Sept 2022 at [facility].     Prevnar PCV-13: Oct 2019  Pneumovax PPSV-23: Sept 2016    Covid Vaccination:     Dose 1 06/07/2019  Dose 2 07/09/2019  Dose 3 11/25/2019  Bivalent booster: Planning to get soon, recommend waiting at least 2 weeks after Evusheld today.       Evusheld:   - Received first dose 06/02/2020  - Second dose 01/01/2021 today in clinic.        We discussed the above including diagnosis and recommendations, agreed on the above plan, and all questions were answered.    Follow-up: Return for Follow-up 12 months for SLE .    Danella Maiers, MD, MSCI  Assistant Professor of Medicine  Department of Medicine/Division of Rheumatology  [facility]  [phone] clinic phone  [phone] clinic secure fax      We appreciate the opportunity to participate in the care of this patient. I personally spent 25 minutes face-to-face and non-face-to-face in the care of this  patient, which includes all pre, intra, and post visit time on the date of service.       Diagnoses and all orders for this visit:    Systemic lupus erythematosus, unspecified SLE type, unspecified organ involvement status (CMS-HCC)  -     C3 complement  -     C4 complement  -     Anti-DNA antibody, double-stranded  -     Urinalysis with Culture Reflex  -     Protein/Creatinine Ratio, Urine  -     CBC w/ Differential  -     Comprehensive Metabolic Panel  -     hydrOXYchloroQUINE (PLAQUENIL) 200 mg tablet; Take 1 tablet (200 mg total) by mouth daily.  -     belimumab (BENLYSTA) 200 mg/mL Syrg; Inject the contents of 1 syringe (200mg ) under the skin once every 7 days.    High risk medication use  -     CBC w/ Differential  -     Comprehensive Metabolic Panel    Immunization counseling  -     Pneumococcal polysaccharide vaccine 23-valent greater than or equal to 2yo subcutaneous/IM (Pneumovax)    Encounter for immunization   -     Pneumococcal polysaccharide vaccine 23-valent greater than or equal to 2yo subcutaneous/IM (Pneumovax)

## 2020-12-28 DIAGNOSIS — H40013 Open angle with borderline findings, low risk, bilateral: Secondary | ICD-10-CM | POA: Diagnosis not present

## 2020-12-28 DIAGNOSIS — H04123 Dry eye syndrome of bilateral lacrimal glands: Secondary | ICD-10-CM | POA: Diagnosis not present

## 2020-12-28 DIAGNOSIS — Z79899 Other long term (current) drug therapy: Secondary | ICD-10-CM | POA: Diagnosis not present

## 2020-12-28 DIAGNOSIS — M3210 Systemic lupus erythematosus, organ or system involvement unspecified: Secondary | ICD-10-CM | POA: Diagnosis not present

## 2020-12-28 DIAGNOSIS — H524 Presbyopia: Secondary | ICD-10-CM | POA: Diagnosis not present

## 2020-12-30 NOTE — Unmapped (Signed)
Received note from 12/28/2020 from eye doctor Dr. Wynell Balloon MD with [facility] without toxicity from plaquenil on exam.

## 2021-01-01 ENCOUNTER — Ambulatory Visit: Admit: 2021-01-01 | Discharge: 2021-01-01 | Payer: MEDICARE

## 2021-01-01 DIAGNOSIS — R079 Chest pain, unspecified: Secondary | ICD-10-CM | POA: Diagnosis not present

## 2021-01-01 DIAGNOSIS — Z79899 Other long term (current) drug therapy: Secondary | ICD-10-CM | POA: Diagnosis not present

## 2021-01-01 DIAGNOSIS — F419 Anxiety disorder, unspecified: Secondary | ICD-10-CM | POA: Diagnosis not present

## 2021-01-01 DIAGNOSIS — F32A Depression, unspecified: Secondary | ICD-10-CM | POA: Diagnosis not present

## 2021-01-01 DIAGNOSIS — Z7185 Encounter for immunization safety counseling: Secondary | ICD-10-CM | POA: Diagnosis not present

## 2021-01-01 DIAGNOSIS — D72819 Decreased white blood cell count, unspecified: Secondary | ICD-10-CM | POA: Diagnosis not present

## 2021-01-01 DIAGNOSIS — Z6823 Body mass index (BMI) 23.0-23.9, adult: Secondary | ICD-10-CM | POA: Diagnosis not present

## 2021-01-01 DIAGNOSIS — Z23 Encounter for immunization: Secondary | ICD-10-CM | POA: Diagnosis not present

## 2021-01-01 DIAGNOSIS — M329 Systemic lupus erythematosus, unspecified: Secondary | ICD-10-CM | POA: Diagnosis not present

## 2021-01-01 DIAGNOSIS — M2550 Pain in unspecified joint: Secondary | ICD-10-CM | POA: Diagnosis not present

## 2021-01-01 LAB — CBC W/ AUTO DIFF
BASOPHILS ABSOLUTE COUNT: 0 10*9/L (ref 0.0–0.1)
BASOPHILS RELATIVE PERCENT: 0.8 %
EOSINOPHILS ABSOLUTE COUNT: 0 10*9/L (ref 0.0–0.5)
EOSINOPHILS RELATIVE PERCENT: 1.1 %
HEMATOCRIT: 37.5 % (ref 34.0–44.0)
HEMOGLOBIN: 12.6 g/dL (ref 11.3–14.9)
LYMPHOCYTES ABSOLUTE COUNT: 1.4 10*9/L (ref 1.1–3.6)
LYMPHOCYTES RELATIVE PERCENT: 37.4 %
MEAN CORPUSCULAR HEMOGLOBIN CONC: 33.7 g/dL (ref 32.0–36.0)
MEAN CORPUSCULAR HEMOGLOBIN: 32.5 pg — ABNORMAL HIGH (ref 25.9–32.4)
MEAN CORPUSCULAR VOLUME: 96.5 fL — ABNORMAL HIGH (ref 77.6–95.7)
MEAN PLATELET VOLUME: 8.5 fL (ref 6.8–10.7)
MONOCYTES ABSOLUTE COUNT: 0.5 10*9/L (ref 0.3–0.8)
MONOCYTES RELATIVE PERCENT: 12.6 %
NEUTROPHILS ABSOLUTE COUNT: 1.8 10*9/L (ref 1.8–7.8)
NEUTROPHILS RELATIVE PERCENT: 48.1 %
NUCLEATED RED BLOOD CELLS: 0 /100{WBCs} (ref <=4)
PLATELET COUNT: 287 10*9/L (ref 150–450)
RED BLOOD CELL COUNT: 3.88 10*12/L — ABNORMAL LOW (ref 3.95–5.13)
RED CELL DISTRIBUTION WIDTH: 12.5 % (ref 12.2–15.2)
WBC ADJUSTED: 3.7 10*9/L (ref 3.6–11.2)

## 2021-01-01 LAB — COMPREHENSIVE METABOLIC PANEL
ALBUMIN: 4.2 g/dL (ref 3.4–5.0)
ALKALINE PHOSPHATASE: 147 U/L — ABNORMAL HIGH (ref 46–116)
ALT (SGPT): 19 U/L (ref 10–49)
ANION GAP: 8 mmol/L (ref 5–14)
AST (SGOT): 22 U/L (ref <=34)
BILIRUBIN TOTAL: 0.6 mg/dL (ref 0.3–1.2)
BLOOD UREA NITROGEN: 18 mg/dL (ref 9–23)
BUN / CREAT RATIO: 23
CALCIUM: 9.7 mg/dL (ref 8.7–10.4)
CHLORIDE: 106 mmol/L (ref 98–107)
CO2: 26.7 mmol/L (ref 20.0–31.0)
CREATININE: 0.8 mg/dL
EGFR CKD-EPI (2021) FEMALE: 88 mL/min/{1.73_m2} (ref >=60)
GLUCOSE RANDOM: 80 mg/dL (ref 70–179)
POTASSIUM: 3.6 mmol/L (ref 3.4–4.8)
PROTEIN TOTAL: 7.6 g/dL (ref 5.7–8.2)
SODIUM: 141 mmol/L (ref 135–145)

## 2021-01-01 LAB — URINALYSIS WITH CULTURE REFLEX
BACTERIA: NONE SEEN /HPF
BILIRUBIN UA: NEGATIVE
BLOOD UA: NEGATIVE
GLUCOSE UA: NEGATIVE
KETONES UA: NEGATIVE
NITRITE UA: NEGATIVE
PH UA: 5.5 (ref 5.0–9.0)
PROTEIN UA: NEGATIVE
RBC UA: <1 /HPF (ref <=4)
SPECIFIC GRAVITY UA: 1.018 (ref 1.003–1.030)
SQUAMOUS EPITHELIAL: <1 /HPF (ref 0–5)
UROBILINOGEN UA: <2
WBC UA: 13 /HPF — ABNORMAL HIGH (ref 0–5)

## 2021-01-01 LAB — PROTEIN / CREATININE RATIO, URINE
CREATININE, URINE: 90.7 mg/dL
PROTEIN URINE: 10.3 mg/dL
PROTEIN/CREAT RATIO, URINE: 0.114

## 2021-01-01 LAB — C3 COMPLEMENT: C3 COMPLEMENT: 107 mg/dL (ref 90–170)

## 2021-01-01 LAB — C4 COMPLEMENT: C4 COMPLEMENT: 28.8 mg/dL (ref 12.0–36.0)

## 2021-01-01 MED ORDER — BENLYSTA 200 MG/ML SUBCUTANEOUS SYRINGE
SUBCUTANEOUS | 3 refills | 0.00000 days | Status: CP
Start: 2021-01-01 — End: 2022-01-01
  Filled 2021-01-12: qty 4, 28d supply, fill #0

## 2021-01-01 MED ORDER — HYDROXYCHLOROQUINE 200 MG TABLET
ORAL_TABLET | Freq: Every day | ORAL | 3 refills | 90 days | Status: CP
Start: 2021-01-01 — End: ?

## 2021-01-01 MED ADMIN — tixagevimab-cilgavimab 300 mg/3 mL- 300 mg/3 mL injection 6 mL: 6 mL | INTRAMUSCULAR | @ 16:00:00 | Stop: 2021-01-01

## 2021-01-01 NOTE — Unmapped (Addendum)
Your physician today was Dr. Danella Maiers    You were seen by [facility] today.      [facility]  [address]   [place], [place] [address]  Phone:  [phone]  Fax:  [phone]     Summary of Plan for 01/01/21    Today we discussed the following:    I think your lupus looks great!    If your ankle is not getting better or gets worse please let me know and I can refer you to sports medicine.     I recommend Covid-19 booster in 2 weeks.  (Please wait at least 2 weeks after Evusheld.)     Pneumovax PPSV-23 today.     Diagnostic testing recommendations:  Labs today (in clinic or on the first floor of the [employer])    Therapeutic / Treatment recommendations:   No change in medications today. Please continue your current treatment regimen. Please notify our clinic if you need refills prior to your next appointment    Referrals and follow-up recommendations:  I will follow-up with you in 12 months    When should I expect results?   Please note that it may take up to 21 days for results to return.  If you have not been notified by phone, mail or [facility] (if applicable) in 21 days, please contact our office at [phone] or via [facility] messaging [email or web address])     What do I do if I have questions or concerns after the visit?   If you have non-urgent questions, the best way to contact your provider is to send a MyChart message by visiting [email or web address].  You can also use MyChart to request refills and access test results.  Providers will do their best to respond within 2-3 business days, although it may take longer in some circumstances.  If you have immediate concerns, please contact our clinic by phone  at [phone].       Thank you for allowing [facility] to be involved in your care!          Immunization History   Administered Date(s) Administered Comments    COVID-19 VACCINE,MRNA(MODERNA)(PF)(IM) 06/07/2019      07/09/2019      11/25/2019     Influenza Vaccine Quad (IIV4 PF) 12mo+ injectable 12/15/2015      10/27/2018     Influenza Virus Vaccine, unspecified formulation 12/05/2014      11/21/2016      11/26/2017     PNEUMOCOCCAL POLYSACCHARIDE 23 12/05/2014     Pneumococcal Conjugate 13-Valent 12/14/2017     TdaP 07/21/2016    Pended Date(s) Pended Comments    PNEUMOCOCCAL POLYSACCHARIDE 23 01/01/2021

## 2021-01-01 NOTE — Unmapped (Signed)
Patient presents in clinic for Evusheld Injection  Injection given in the left and right dorsogluteal  Lot#: [id number]  Exp:02-2021  No injection site reaction noted.  Patient tolerated the medication well.  Time injection given:11:47  AM  Patient education discussed with patient, copy of FDA patient fact sheet given.      Return as scheduled with provider.

## 2021-01-02 NOTE — Unmapped (Signed)
Sent the pt the following message:    Still waiting on the dsDNA which always takes longer, but the results from your other labs are normal or with minor abnormalities without clinical significance.     I will update you when that last test is complete.     Dr. Sullivan Lone

## 2021-01-06 LAB — ANTI-DNA ANTIBODY, DOUBLE-STRANDED: DSDNA ANTIBODY: NEGATIVE

## 2021-01-07 NOTE — Unmapped (Signed)
Sent pt the following message:    The last test we were waiting for was normal.  Let me know if you have any questions.    Dr. Alireza Pollack

## 2021-01-08 NOTE — Unmapped (Signed)
[facility] Clinical Assessment & Refill Coordination Note    Tonya Boyer, DOB: [birth year]  Phone: [phone] (home)     All above HIPAA information was verified with patient.     Was a Nurse, learning disability used for this call? No    Specialty Medication(s):   Inflammatory Disorders: Benlysta     Current Outpatient Medications   Medication Sig Dispense Refill   ??? belimumab (BENLYSTA) 200 mg/mL Syrg Inject the contents of 1 syringe (200mg ) under the skin once every 7 days. 12 mL 3   ??? dexamethasone (DECADRON) 10 mg/mL injection Infuse into a venous catheter. Dose unknown, every 6-8 weeks. Injections. Given with lidocaine and marcaine.     ??? DULoxetine (CYMBALTA) 20 MG capsule Take 20 mg by mouth daily.  0   ??? empty container (SHARPS-A-GATOR DISPOSAL SYSTEM) Misc Use as directed for sharps disposal 1 each 2   ??? empty container Misc Use as directed 1 each 2   ??? empty container Misc Use as directed 1 each 2   ??? hydrOXYchloroQUINE (PLAQUENIL) 200 mg tablet Take 1 tablet (200 mg total) by mouth daily. 90 tablet 3   ??? naproxen sodium (ALEVE) 220 MG tablet Take 220 mg by mouth continuous as needed for pain.       No current facility-administered medications for this visit.        Changes to medications: Kaydi reports no changes at this time.    Allergies   Allergen Reactions   ??? Amoxicillin Hives   ??? Penicillins Hives   ??? Latex Other (See Comments)     Skin irritation   ??? Penicillin G    ??? Prednisone    ??? Latex, Natural Rubber Rash       Changes to allergies: No    SPECIALTY MEDICATION ADHERENCE         Medication Adherence    Specialty Medication: Benlysta 200mg /mL          Specialty medication(s) dose(s) confirmed: Regimen is correct and unchanged.     Are there any concerns with adherence? No    Adherence counseling provided? Not needed    CLINICAL MANAGEMENT AND INTERVENTION      Clinical Benefit Assessment:    Do you feel the medicine is effective or helping your condition? Yes    Clinical Benefit counseling provided? Progress note from 10/21 shows evidence of clinical benefit    Adverse Effects Assessment:    Are you experiencing any side effects? No    Are you experiencing difficulty administering your medicine? No    Quality of Life Assessment:    Quality of Life    Rheumatology  Oncology  Dermatology  Cystic Fibrosis              Have you discussed this with your provider? Not needed    Acute Infection Status:    Acute infections noted within Epic:  No active infections  Patient reported infection: None    Therapy Appropriateness:    Is therapy appropriate and patient progressing towards therapeutic goals? Yes, therapy is appropriate and should be continued    DISEASE/MEDICATION-SPECIFIC INFORMATION      For patients on injectable medications: Patient currently has 2 doses left.  Next injection is scheduled for 10/28, 11/4.    PATIENT SPECIFIC NEEDS     - Does the patient have any physical, cognitive, or cultural barriers? No    - Is the patient high risk? No    - Does  the patient require a Care Management Plan? No     - Does the patient require physician intervention or other additional services (i.e. nutrition, smoking cessation, social work)? No      SHIPPING     Specialty Medication(s) to be Shipped:   Inflammatory Disorders: Benlysta    Other medication(s) to be shipped: No additional medications requested for fill at this time     Changes to insurance: No    Delivery Scheduled: Yes, Expected medication delivery date: 11/2.     Medication will be delivered via UPS to the confirmed prescription address in [facility].    The patient will receive a drug information handout for each medication shipped and additional FDA Medication Guides as required.  Verified that patient has previously received a Conservation officer, historic buildings and a Surveyor, mining.    The patient or caregiver noted above participated in the development of this care plan and knows that they can request review of or adjustments to the care plan at any time.      All of the patient's questions and concerns have been addressed.    Julianne Rice   [facility] Specialty Pharmacist

## 2021-01-18 DIAGNOSIS — L905 Scar conditions and fibrosis of skin: Secondary | ICD-10-CM | POA: Diagnosis not present

## 2021-01-18 DIAGNOSIS — L910 Hypertrophic scar: Secondary | ICD-10-CM | POA: Diagnosis not present

## 2021-01-25 DIAGNOSIS — G518 Other disorders of facial nerve: Secondary | ICD-10-CM | POA: Diagnosis not present

## 2021-01-25 DIAGNOSIS — G43719 Chronic migraine without aura, intractable, without status migrainosus: Secondary | ICD-10-CM | POA: Diagnosis not present

## 2021-01-25 DIAGNOSIS — M791 Myalgia, unspecified site: Secondary | ICD-10-CM | POA: Diagnosis not present

## 2021-01-25 DIAGNOSIS — M542 Cervicalgia: Secondary | ICD-10-CM | POA: Diagnosis not present

## 2021-01-29 NOTE — Unmapped (Signed)
[facility] Refill Coordination Note    Specialty Medication(s) to be Shipped:   Inflammatory Disorders: Benlysta    Other medication(s) to be shipped: No additional medications requested for fill at this time     Niralya Ohanian, DOB: December 18, 1965  Phone: [phone] (home)       All above HIPAA information was verified with patient.     Was a Nurse, learning disability used for this call? No    Completed refill call assessment today to schedule patient's medication shipment from the [facility] [phone]).  All relevant notes have been reviewed.     Specialty medication(s) and dose(s) confirmed: Regimen is correct and unchanged.   Changes to medications: Neetu reports no changes at this time.  Changes to insurance: No  New side effects reported not previously addressed with a pharmacist or physician: None reported  Questions for the pharmacist: No    Confirmed patient received a Conservation officer, historic buildings and a Surveyor, mining with first shipment. The patient will receive a drug information handout for each medication shipped and additional FDA Medication Guides as required.       DISEASE/MEDICATION-SPECIFIC INFORMATION        For patients on injectable medications: Patient currently has 2 doses left.  Next injection is scheduled for 01/29/21.    SPECIALTY MEDICATION ADHERENCE     Medication Adherence    Patient reported X missed doses in the last month: 0  Specialty Medication: BENLYSTA  Patient is on additional specialty medications: No              Were doses missed due to medication being on hold? No    Benlysta 200 mg/ml: 7 days of medicine on hand         REFERRAL TO PHARMACIST     Referral to the pharmacist: Not needed      [facility]     Shipping address confirmed in Epic.     Delivery Scheduled: Yes, Expected medication delivery date: 02/09/21.     Medication will be delivered via UPS to the prescription address in Epic [place].    Unk Lightning   [facility] Specialty Technician

## 2021-02-08 DIAGNOSIS — M329 Systemic lupus erythematosus, unspecified: Principal | ICD-10-CM

## 2021-02-08 MED FILL — BENLYSTA 200 MG/ML SUBCUTANEOUS SYRINGE: 28 days supply | Qty: 4 | Fill #1

## 2021-02-25 DIAGNOSIS — Z6824 Body mass index (BMI) 24.0-24.9, adult: Secondary | ICD-10-CM | POA: Diagnosis not present

## 2021-02-25 DIAGNOSIS — Z124 Encounter for screening for malignant neoplasm of cervix: Secondary | ICD-10-CM | POA: Diagnosis not present

## 2021-02-25 DIAGNOSIS — Z1231 Encounter for screening mammogram for malignant neoplasm of breast: Secondary | ICD-10-CM | POA: Diagnosis not present

## 2021-03-01 ENCOUNTER — Other Ambulatory Visit: Payer: Self-pay | Admitting: Obstetrics and Gynecology

## 2021-03-01 DIAGNOSIS — R928 Other abnormal and inconclusive findings on diagnostic imaging of breast: Secondary | ICD-10-CM

## 2021-03-03 NOTE — Unmapped (Signed)
[facility] Refill Coordination Note    Specialty Medication(s) to be Shipped:   Inflammatory Disorders: Benlysta    Other medication(s) to be shipped: No additional medications requested for fill at this time     Onesti Bonfiglio, DOB: 07/04/1965  Phone: [phone] (home)       All above HIPAA information was verified with patient.     Was a Nurse, learning disability used for this call? No    Completed refill call assessment today to schedule patient's medication shipment from the [facility] [phone]).  All relevant notes have been reviewed.     Specialty medication(s) and dose(s) confirmed: Regimen is correct and unchanged.   Changes to medications: Nour reports no changes at this time.  Changes to insurance: No  New side effects reported not previously addressed with a pharmacist or physician: None reported  Questions for the pharmacist: No    Confirmed patient received a Conservation officer, historic buildings and a Surveyor, mining with first shipment. The patient will receive a drug information handout for each medication shipped and additional FDA Medication Guides as required.       DISEASE/MEDICATION-SPECIFIC INFORMATION        For patients on injectable medications: Patient currently has 2 doses left.  Next injection is scheduled for 12/23.    SPECIALTY MEDICATION ADHERENCE     Medication Adherence    Patient reported X missed doses in the last month: 0  Specialty Medication: Benlysta  Patient is on additional specialty medications: No  Patient is on more than two specialty medications: No  Any gaps in refill history greater than 2 weeks in the last 3 months: no  Demonstrates understanding of importance of adherence: yes  Informant: patient              Were doses missed due to medication being on hold? No    Benlysta 200mg /ml: Patient has 14 days of medication on hand    REFERRAL TO PHARMACIST     Referral to the pharmacist: Not needed      [facility]     Shipping address confirmed in Epic. Delivery Scheduled: Yes, Expected medication delivery date: 12/28.     Medication will be delivered via UPS to the prescription address in Epic [facility].    [name]   [facility] Specialty Technician

## 2021-03-09 DIAGNOSIS — M329 Systemic lupus erythematosus, unspecified: Principal | ICD-10-CM

## 2021-03-09 MED FILL — BENLYSTA 200 MG/ML SUBCUTANEOUS SYRINGE: 28 days supply | Qty: 4 | Fill #2

## 2021-03-22 DIAGNOSIS — M791 Myalgia, unspecified site: Secondary | ICD-10-CM | POA: Diagnosis not present

## 2021-03-22 DIAGNOSIS — G43719 Chronic migraine without aura, intractable, without status migrainosus: Secondary | ICD-10-CM | POA: Diagnosis not present

## 2021-03-22 DIAGNOSIS — G518 Other disorders of facial nerve: Secondary | ICD-10-CM | POA: Diagnosis not present

## 2021-03-22 DIAGNOSIS — M542 Cervicalgia: Secondary | ICD-10-CM | POA: Diagnosis not present

## 2021-03-25 ENCOUNTER — Ambulatory Visit: Payer: Medicare PPO

## 2021-03-25 ENCOUNTER — Ambulatory Visit
Admission: RE | Admit: 2021-03-25 | Discharge: 2021-03-25 | Disposition: A | Discharge status: Home / Self Care | Payer: Medicare PPO | Source: Ambulatory Visit | Attending: Obstetrics and Gynecology | Admitting: Obstetrics and Gynecology

## 2021-03-25 DIAGNOSIS — R922 Inconclusive mammogram: Secondary | ICD-10-CM | POA: Diagnosis not present

## 2021-03-25 DIAGNOSIS — R928 Other abnormal and inconclusive findings on diagnostic imaging of breast: Secondary | ICD-10-CM

## 2021-04-02 NOTE — Unmapped (Signed)
[facility] Refill Coordination Note    Specialty Medication(s) to be Shipped:   Inflammatory Disorders: Benlysta    Other medication(s) to be shipped: No additional medications requested for fill at this time     Tonya Boyer, DOB: [birth year]  Phone: [phone] (home)       All above HIPAA information was verified with patient.     Was a Nurse, learning disability used for this call? No    Completed refill call assessment today to schedule patient's medication shipment from the [facility] [phone]).  All relevant notes have been reviewed.     Specialty medication(s) and dose(s) confirmed: Regimen is correct and unchanged.   Changes to medications: Tonya Boyer reports no changes at this time.  Changes to insurance: No  New side effects reported not previously addressed with a pharmacist or physician: None reported  Questions for the pharmacist: No    Confirmed patient received a Conservation officer, historic buildings and a Surveyor, mining with first shipment. The patient will receive a drug information handout for each medication shipped and additional FDA Medication Guides as required.       DISEASE/MEDICATION-SPECIFIC INFORMATION        For patients on injectable medications: Patient currently has 2 doses left.  Next injection is scheduled for 1/20.    SPECIALTY MEDICATION ADHERENCE     Medication Adherence    Patient reported X missed doses in the last month: 0  Specialty Medication: Benlysta  Patient is on additional specialty medications: No  Patient is on more than two specialty medications: No  Any gaps in refill history greater than 2 weeks in the last 3 months: no  Demonstrates understanding of importance of adherence: yes  Informant: patient              Were doses missed due to medication being on hold? No    Benlysta 200mg /ml: Patient has 14 days of medication on hand     REFERRAL TO PHARMACIST     Referral to the pharmacist: Not needed      [facility]     Shipping address confirmed in Epic. Delivery Scheduled: Yes, Expected medication delivery date: 1/26.     Medication will be delivered via UPS to the prescription address in Epic [facility].    Tonya Boyer   [facility] Specialty Technician

## 2021-04-07 MED FILL — BENLYSTA 200 MG/ML SUBCUTANEOUS SYRINGE: 28 days supply | Qty: 4 | Fill #3

## 2021-04-07 MED FILL — EMPTY CONTAINER: 120 days supply | Qty: 1 | Fill #1

## 2021-05-07 NOTE — Unmapped (Signed)
[facility] Refill Coordination Note    Specialty Medication(s) to be Shipped:   Inflammatory Disorders: Benlysta    Other medication(s) to be shipped: No additional medications requested for fill at this time     Tonya Boyer, DOB: 1965-09-13  Phone: [phone] (home)       All above HIPAA information was verified with patient.     Was a Nurse, learning disability used for this call? No    Completed refill call assessment today to schedule patient's medication shipment from the [facility] [phone]).  All relevant notes have been reviewed.     Specialty medication(s) and dose(s) confirmed: Regimen is correct and unchanged.   Changes to medications: [name] reports no changes at this time.  Changes to insurance: No  New side effects reported not previously addressed with a pharmacist or physician: None reported  Questions for the pharmacist: No    Confirmed patient received a Conservation officer, historic buildings and a Surveyor, mining with first shipment. The patient will receive a drug information handout for each medication shipped and additional FDA Medication Guides as required.       DISEASE/MEDICATION-SPECIFIC INFORMATION        For patients on injectable medications: Patient currently has 0 doses left.  Next injection is scheduled for 3/3.    SPECIALTY MEDICATION ADHERENCE     Medication Adherence    Patient reported X missed doses in the last month: 0  Specialty Medication: Benlysta  Patient is on additional specialty medications: No  Patient is on more than two specialty medications: No  Any gaps in refill history greater than 2 weeks in the last 3 months: no  Demonstrates understanding of importance of adherence: yes  Informant: patient              Were doses missed due to medication being on hold? No    Benlysta 200mg /ml: Patient has 0 days of medication on hand    REFERRAL TO PHARMACIST     Referral to the pharmacist: Not needed      [facility]     Shipping address confirmed in Epic. Delivery Scheduled: Yes, Expected medication delivery date: 3/1.     Medication will be delivered via UPS to the prescription address in Epic [facility].    [name]   [facility] Specialty Technician

## 2021-05-07 NOTE — Unmapped (Signed)
The [facility] has made a second and final attempt to reach this patient to refill the following medication:Benlysta.      We have left voicemails on the following phone numbers: [phone].[employer]    Dates contacted: 2/16, 2/24  Last scheduled delivery: 1/25    The patient may be at risk of non-compliance with this medication. The patient should call the [facility] at [phone]  Option 4, then Option 2 (all other specialty patients) to refill medication.    [name]   [facility] Specialty Technician

## 2021-05-11 MED FILL — BENLYSTA 200 MG/ML SUBCUTANEOUS SYRINGE: 28 days supply | Qty: 4 | Fill #4

## 2021-05-19 DIAGNOSIS — M791 Myalgia, unspecified site: Secondary | ICD-10-CM | POA: Diagnosis not present

## 2021-05-19 DIAGNOSIS — G43719 Chronic migraine without aura, intractable, without status migrainosus: Secondary | ICD-10-CM | POA: Diagnosis not present

## 2021-05-19 DIAGNOSIS — M542 Cervicalgia: Secondary | ICD-10-CM | POA: Diagnosis not present

## 2021-05-19 DIAGNOSIS — G518 Other disorders of facial nerve: Secondary | ICD-10-CM | POA: Diagnosis not present

## 2021-06-03 NOTE — Unmapped (Signed)
[facility] Refill Coordination Note    Specialty Medication(s) to be Shipped:   Inflammatory Disorders: Benlysta    Other medication(s) to be shipped: No additional medications requested for fill at this time     Tonya Boyer, DOB: 02/16/1966  Phone: [phone] (home)       All above HIPAA information was verified with patient.     Was a Nurse, learning disability used for this call? No    Completed refill call assessment today to schedule patient's medication shipment from the [facility] [phone]).  All relevant notes have been reviewed.     Specialty medication(s) and dose(s) confirmed: Regimen is correct and unchanged.   Changes to medications: Rielly reports no changes at this time.  Changes to insurance: No  New side effects reported not previously addressed with a pharmacist or physician: None reported  Questions for the pharmacist: No    Confirmed patient received a Conservation officer, historic buildings and a Surveyor, mining with first shipment. The patient will receive a drug information handout for each medication shipped and additional FDA Medication Guides as required.       DISEASE/MEDICATION-SPECIFIC INFORMATION        For patients on injectable medications: Patient currently has 1 doses left.  Next injection is scheduled for 06/04/21.    SPECIALTY MEDICATION ADHERENCE     Medication Adherence    Patient reported X missed doses in the last month: 0  Specialty Medication: benlysta 200mg /mL  Patient is on additional specialty medications: No  Informant: patient              Were doses missed due to medication being on hold? No    benlysta 200mg /mL: 1 days of medicine on hand       REFERRAL TO PHARMACIST     Referral to the pharmacist: Not needed      [facility]     Shipping address confirmed in Epic.     Delivery Scheduled: Yes, Expected medication delivery date: 06/08/21.     Medication will be delivered via UPS to the prescription address in Epic [place].    Wyatt Mage M Elisabeth Cara   [facility] Specialty Technician

## 2021-06-07 DIAGNOSIS — M329 Systemic lupus erythematosus, unspecified: Principal | ICD-10-CM

## 2021-06-07 NOTE — Unmapped (Signed)
Tonya Boyer 's Benlysta shipment will be delayed as a result of a high copay.     I have reached out to the patient  at [phone] and communicated the delay. We will call the patient back to reschedule the delivery upon resolution. We have not confirmed the new delivery date.

## 2021-06-25 NOTE — Unmapped (Signed)
Amalea Malen Gauze 's Benlysta shipment will be canceled  as a result of MAPs seeking manufacture assistance. MFR assistance approved thru [employer] through 03/13/22.    We will not reschedule the medication and have removed this/these medication(s) from the work request.  We have canceled this work request.

## 2021-06-25 NOTE — Unmapped (Signed)
Specialty Medication(s): Benlysta    Tonya Boyer has been dis-enrolled from the [facility] specialty pharmacy services due to a pharmacy change. The patient is now filling at [facility] Assistance.    Additional information provided to the patient: n/a    Laurene Footman Ramari Bray  [facility] Specialty Pharmacist

## 2021-07-16 ENCOUNTER — Other Ambulatory Visit: Payer: Self-pay | Admitting: Interventional Radiology

## 2021-07-16 DIAGNOSIS — D1771 Benign lipomatous neoplasm of kidney: Secondary | ICD-10-CM

## 2021-07-16 DIAGNOSIS — I341 Nonrheumatic mitral (valve) prolapse: Principal | ICD-10-CM

## 2021-07-19 DIAGNOSIS — M329 Systemic lupus erythematosus, unspecified: Principal | ICD-10-CM

## 2021-07-30 DIAGNOSIS — M542 Cervicalgia: Secondary | ICD-10-CM | POA: Diagnosis not present

## 2021-07-30 DIAGNOSIS — G43719 Chronic migraine without aura, intractable, without status migrainosus: Secondary | ICD-10-CM | POA: Diagnosis not present

## 2021-07-30 DIAGNOSIS — G518 Other disorders of facial nerve: Secondary | ICD-10-CM | POA: Diagnosis not present

## 2021-07-30 DIAGNOSIS — M791 Myalgia, unspecified site: Secondary | ICD-10-CM | POA: Diagnosis not present

## 2021-08-24 DIAGNOSIS — I10 Essential (primary) hypertension: Secondary | ICD-10-CM | POA: Diagnosis not present

## 2021-08-24 DIAGNOSIS — E039 Hypothyroidism, unspecified: Secondary | ICD-10-CM | POA: Diagnosis not present

## 2021-08-24 DIAGNOSIS — Z0000 Encounter for general adult medical examination without abnormal findings: Secondary | ICD-10-CM | POA: Diagnosis not present

## 2021-08-24 DIAGNOSIS — E7849 Other hyperlipidemia: Secondary | ICD-10-CM | POA: Diagnosis not present

## 2021-08-24 DIAGNOSIS — F419 Anxiety disorder, unspecified: Secondary | ICD-10-CM | POA: Diagnosis not present

## 2021-08-24 DIAGNOSIS — G43909 Migraine, unspecified, not intractable, without status migrainosus: Secondary | ICD-10-CM | POA: Diagnosis not present

## 2021-08-24 DIAGNOSIS — E559 Vitamin D deficiency, unspecified: Secondary | ICD-10-CM | POA: Diagnosis not present

## 2021-08-24 DIAGNOSIS — Z6823 Body mass index (BMI) 23.0-23.9, adult: Secondary | ICD-10-CM | POA: Diagnosis not present

## 2021-08-24 DIAGNOSIS — M329 Systemic lupus erythematosus, unspecified: Secondary | ICD-10-CM | POA: Diagnosis not present

## 2021-08-24 DIAGNOSIS — D550 Anemia due to glucose-6-phosphate dehydrogenase [G6PD] deficiency: Secondary | ICD-10-CM | POA: Diagnosis not present

## 2021-08-24 DIAGNOSIS — E782 Mixed hyperlipidemia: Secondary | ICD-10-CM | POA: Diagnosis not present

## 2021-08-31 ENCOUNTER — Ambulatory Visit (HOSPITAL_COMMUNITY)
Admission: RE | Admit: 2021-08-31 | Discharge: 2021-08-31 | Disposition: A | Discharge status: Home / Self Care | Payer: Medicare PPO | Source: Ambulatory Visit | Attending: Interventional Radiology | Admitting: Interventional Radiology

## 2021-08-31 DIAGNOSIS — D1803 Hemangioma of intra-abdominal structures: Secondary | ICD-10-CM | POA: Diagnosis not present

## 2021-08-31 DIAGNOSIS — D3002 Benign neoplasm of left kidney: Secondary | ICD-10-CM | POA: Diagnosis not present

## 2021-08-31 DIAGNOSIS — D1771 Benign lipomatous neoplasm of kidney: Secondary | ICD-10-CM | POA: Insufficient documentation

## 2021-08-31 DIAGNOSIS — Q8909 Congenital malformations of spleen: Secondary | ICD-10-CM | POA: Diagnosis not present

## 2021-08-31 LAB — POCT I-STAT CREATININE: Creatinine, Ser: 1 mg/dL (ref 0.44–1.00)

## 2021-08-31 MED ORDER — IOHEXOL 300 MG/ML  SOLN
100.0000 mL | Freq: Once | INTRAMUSCULAR | Status: AC | PRN
  Administered 2021-08-31: 100 mL via INTRAVENOUS

## 2021-09-10 ENCOUNTER — Ambulatory Visit
Admission: RE | Admit: 2021-09-10 | Discharge: 2021-09-10 | Disposition: A | Discharge status: Home / Self Care | Payer: Medicare PPO | Source: Ambulatory Visit | Attending: Interventional Radiology | Admitting: Interventional Radiology

## 2021-09-10 ENCOUNTER — Encounter: Payer: Self-pay | Admitting: *Deleted

## 2021-09-10 DIAGNOSIS — D1771 Benign lipomatous neoplasm of kidney: Secondary | ICD-10-CM | POA: Diagnosis not present

## 2021-09-10 HISTORY — PX: IR RADIOLOGIST EVAL & MGMT: IMG5224

## 2021-09-10 NOTE — Progress Notes (Signed)
Patient ID: Dawn Marsh, female   DOB: 31-May-1965, 56 y.o.   MRN: [id number]       Chief Complaint:  Left renal angiomyolipoma, continued outpatient surveillance  Referring Physician(s): McKenzie  History of Present Illness: Dawn Marsh is a 56 y.o. female with a known history of a left renal angiomyolipoma exophytic on the lower pole.  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  She has a chronic history of SLE and leukopenia.  Overall she remains asymptomatic from a renal or urinary tract standpoint.  No flank or abdominal pain.  No fever, dysuria or hematuria.  No recent illness or fever.  She had repeat surveillance imaging at [facility] on 08/31/2021.  This confirms trace enlargement of the exophytic left inferior pole renal angiomyolipoma now measuring 4.3 cm, previously 4 cm.  No signs of intra lesion hemorrhage or retroperitoneal hemorrhage.  No other acute renal abnormality.    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   Hypertension    Left renal mass    Leukopenia 12/27/2012   Lupus ([place])    Migraine    Nontoxic simple goitre 12/27/2012   Rhinitis, allergic    seasonal   Seizures (HCC)    migraines   SLE (systemic lupus erythematosus) ([place]) 12/27/2012   Vertigo     Past Surgical History:  Procedure Laterality Date   ABDOMINAL HYSTERECTOMY     ABDOMINAL HYSTERECTOMY  2011   IR RADIOLOGIST EVAL & MGMT  01/12/2017   IR RADIOLOGIST EVAL & MGMT  02/07/2017   IR RADIOLOGIST EVAL & MGMT  12/07/2017   IR RADIOLOGIST EVAL & MGMT  12/26/2018   IR RADIOLOGIST  EVAL & MGMT  06/25/2019   IR RADIOLOGIST EVAL & MGMT  08/25/2020   IR RENAL SELECTIVE  UNI INC S&I MOD SED  11/12/2020   IR US GUIDE VASC ACCESS RIGHT  11/12/2020   KIDNEY SURGERY     MYOMECTOMY ABDOMINAL APPROACH     myomectomy x 2 (prior to the hysterectomy)  2002 and 2006   TUMOR REMOVAL      Allergies: Amoxicillin, Amoxil [amoxicillin trihydrate], Penicillins, and Latex  Medications: Prior to Admission medications   Medication Sig Start Date End Date Taking? Authorizing Provider  Belimumab (BENLYSTA) 200 MG/ML SOAJ Inject 200 mg into the skin every Friday.    [provider]  Calcium Carbonate-Vitamin D (CALCIUM 600+D PO) Take 1 tablet by mouth daily.    [provider]  Cholecalciferol (VITAMIN D) 50 MCG (2000 UT) tablet Take 2,000 Units by mouth daily.    [provider]  dexamethasone (DECADRON) 10 MG/ML injection Inject 10 mg into the vein every 8 (eight) weeks.    [provider]  DULoxetine (CYMBALTA) 30 MG capsule Take 30 mg by mouth daily.    [provider]  hydroxychloroquine (PLAQUENIL) 200 MG tablet Take 200 mg by mouth daily.    [provider]  naproxen sodium (ALEVE) 220 MG tablet Take 220 mg by mouth daily as needed (pain).    [provider]  triamcinolone  ointment (KENALOG) 0.1 % Apply 1 application topically 2 (two) times daily as needed (rash).    [provider]     No family history on file.  Social History   Socioeconomic History   Marital status: Divorced    Spouse name: Not on file   Number of children: Not on file   Years of education: Not on file   Highest education level: Not on file  Occupational History   Not on file  Tobacco Use   Smoking status: Never   Smokeless tobacco: Never  Substance and Sexual Activity   Alcohol use: Yes    Comment: occas-maybe once a month   Drug use: No   Sexual activity: Not on file  Other Topics Concern   Not on file  Social History Narrative    ** Merged History Encounter **       Social Determinants of Health   Financial Resource Strain: Not on file  Food Insecurity: Not on file  Transportation Needs: Not on file  Physical Activity: Not on file  Stress: Not on file  Social Connections: Not on file     Review of Systems  Review of Systems: A 12 point ROS discussed and pertinent positives are indicated in the HPI above.  All other systems are negative.    Physical Exam No direct physical exam was performed, telephone health visit only today because of COVID pandemic Vital Signs: There were no vitals taken for this visit.  Imaging: CT ABDOMEN W WO CONTRAST  Result Date: 09/01/2021 CLINICAL DATA:  Follow-up left renal angiomyolipoma. Attempted embolization 11/12/2020 EXAM: CT ABDOMEN WITHOUT AND WITH CONTRAST TECHNIQUE: Multidetector CT imaging of the abdomen was performed following the standard protocol before and following the bolus administration of intravenous contrast. RADIATION DOSE REDUCTION: This exam was performed according to the departmental dose-optimization program which includes automated exposure control, adjustment of the mA and/or kV according to patient size and/or use of iterative reconstruction technique. CONTRAST:  126m OMNIPAQUE IOHEXOL 300 MG/ML  SOLN COMPARISON:  CT scan 08/14/2020 FINDINGS: Lower chest: The lung bases are clear of acute process. No pleural effusion or pulmonary lesions. The heart is normal in size. No pericardial effusion. The distal esophagus and aorta are unremarkable. Hepatobiliary: Stable 2 cm segment 2 hemangioma. No worrisome hepatic lesions or intrahepatic biliary dilatation. The portal and hepatic veins are patent. Gallbladder is unremarkable. No common bile duct dilatation. Pancreas: No mass, inflammation or ductal dilatation. Spleen: Normal size. No focal lesions. Stable small adjacent splenule. Adrenals/Urinary Tract: The adrenal glands are normal. Large exophytic  angiomyolipoma projecting off the lower pole region of left kidney measures approximately 4.3 x 4.2 x 4.0 cm. On the prior study 1 year ago it measured 4.0 x 4.0 x 4.0 cm. Stable areas of mild contrast enhancement. Stable small angiomyolipoma involving the upper pole region of the right kidney. This measures a maximum 7 mm. Stable cortical scarring changes involving the left kidney laterally. No new renal lesions. No hydronephrosis. No collecting system abnormalities on the delayed images. Stomach/Bowel: The stomach, duodenum, small bowel and colon are unremarkable. Vascular/Lymphatic: The aorta and branch vessels are patent. No atherosclerotic calcifications, aneurysm or dissection. No abdominal lymphadenopathy. The major venous structures are patent. Other: No ascites or abdominal wall hernia. Musculoskeletal: No significant bony findings. IMPRESSION: 1. Minimal interval increase in size of the exophytic angiomyolipoma projecting off the lower pole region of the left kidney. 2. Stable small angiomyolipoma involving the upper pole region of the right  kidney. 3. Stable cortical scarring changes involving the left kidney laterally. 4. Stable 2 cm segment 2 hepatic hemangioma. Electronically Signed   By: Marijo Sanes M.D.   On: 09/01/2021 09:22    Labs:  CBC: Recent Labs    11/12/20 0619  WBC 3.6*  HGB 12.3  HCT 38.2  PLT 283    COAGS: Recent Labs    11/12/20 0619  INR 1.0    BMP: Recent Labs    11/12/20 0619 08/31/21 0816  NA 138  --   K 3.5  --   CL 107  --   CO2 24  --   GLUCOSE 93  --   BUN 18  --   CALCIUM 9.3  --   CREATININE 0.91 1.00  GFRNONAA >60  --     LIVER FUNCTION TESTS: No results for input(s): "BILITOT", "AST", "ALT", "ALKPHOS", "PROT", "ALBUMIN" in the last 8760 hours.  Assessment and Plan:  Trace interval enlargement of the known left renal angiomyolipoma exophytic off the lower pole measuring 4.3 cm, previously 4 cm.  Attempted angioembolization was  performed 11/12/2020.  Despite extensive microcatheterization search, embolization at that time was unsuccessful.  Plan: Continue CTA surveillance imaging at 6 months at [facility].  If there is continued enlargement, consider second attempt at angioembolization at [facility] utilizing the [place] room with vessel navigation.   Electronically Signed: Greggory Keen 09/10/2021, 10:06 AM   I spent a total of    40 Minutes in remote  clinical consultation, greater than 50% of which was counseling/coordinating care for this patient with a known left renal angiomyolipoma.    Visit type: Audio only (telephone). Audio (no video) only due to patient's lack of internet/smartphone capability. Alternative for in-person consultation at [facility], [place] [place], [place], [place]. This visit type was conducted due to national recommendations for restrictions regarding the COVID-19 Pandemic (e.g. social distancing).  This format is felt to be most appropriate for this patient at this time.  All issues noted in this document were discussed and addressed.

## 2021-09-24 DIAGNOSIS — G518 Other disorders of facial nerve: Secondary | ICD-10-CM | POA: Diagnosis not present

## 2021-09-24 DIAGNOSIS — M791 Myalgia, unspecified site: Secondary | ICD-10-CM | POA: Diagnosis not present

## 2021-09-24 DIAGNOSIS — M542 Cervicalgia: Secondary | ICD-10-CM | POA: Diagnosis not present

## 2021-09-24 DIAGNOSIS — G43719 Chronic migraine without aura, intractable, without status migrainosus: Secondary | ICD-10-CM | POA: Diagnosis not present

## 2021-10-13 ENCOUNTER — Ambulatory Visit: Admit: 2021-10-13 | Discharge: 2021-10-14 | Payer: MEDICARE

## 2021-10-13 DIAGNOSIS — I341 Nonrheumatic mitral (valve) prolapse: Secondary | ICD-10-CM | POA: Diagnosis not present

## 2021-10-13 DIAGNOSIS — R079 Chest pain, unspecified: Secondary | ICD-10-CM | POA: Diagnosis not present

## 2021-10-13 DIAGNOSIS — M3219 Other organ or system involvement in systemic lupus erythematosus: Secondary | ICD-10-CM | POA: Diagnosis not present

## 2021-10-13 DIAGNOSIS — Z6825 Body mass index (BMI) 25.0-25.9, adult: Secondary | ICD-10-CM | POA: Diagnosis not present

## 2021-10-13 DIAGNOSIS — G8929 Other chronic pain: Secondary | ICD-10-CM | POA: Diagnosis not present

## 2021-10-13 DIAGNOSIS — M3500 Sicca syndrome, unspecified: Secondary | ICD-10-CM | POA: Diagnosis not present

## 2021-10-13 DIAGNOSIS — R0609 Other forms of dyspnea: Secondary | ICD-10-CM | POA: Diagnosis not present

## 2021-10-13 DIAGNOSIS — M797 Fibromyalgia: Secondary | ICD-10-CM | POA: Diagnosis not present

## 2021-10-13 DIAGNOSIS — I209 Angina pectoris, unspecified: Secondary | ICD-10-CM | POA: Diagnosis not present

## 2021-10-13 DIAGNOSIS — M329 Systemic lupus erythematosus, unspecified: Secondary | ICD-10-CM | POA: Diagnosis not present

## 2021-11-08 DIAGNOSIS — I209 Angina pectoris, unspecified: Secondary | ICD-10-CM | POA: Diagnosis not present

## 2021-11-18 DIAGNOSIS — M791 Myalgia, unspecified site: Secondary | ICD-10-CM | POA: Diagnosis not present

## 2021-11-18 DIAGNOSIS — G43719 Chronic migraine without aura, intractable, without status migrainosus: Secondary | ICD-10-CM | POA: Diagnosis not present

## 2021-11-18 DIAGNOSIS — G518 Other disorders of facial nerve: Secondary | ICD-10-CM | POA: Diagnosis not present

## 2021-11-18 DIAGNOSIS — M542 Cervicalgia: Secondary | ICD-10-CM | POA: Diagnosis not present

## 2021-11-25 ENCOUNTER — Ambulatory Visit: Admit: 2021-11-25 | Discharge: 2021-11-25 | Payer: MEDICARE

## 2021-11-25 DIAGNOSIS — I272 Pulmonary hypertension, unspecified: Secondary | ICD-10-CM | POA: Diagnosis not present

## 2021-11-25 DIAGNOSIS — R0609 Other forms of dyspnea: Secondary | ICD-10-CM | POA: Diagnosis not present

## 2021-11-25 DIAGNOSIS — I341 Nonrheumatic mitral (valve) prolapse: Secondary | ICD-10-CM | POA: Diagnosis not present

## 2021-11-25 DIAGNOSIS — M3500 Sicca syndrome, unspecified: Secondary | ICD-10-CM | POA: Diagnosis not present

## 2021-11-25 DIAGNOSIS — I209 Angina pectoris, unspecified: Secondary | ICD-10-CM | POA: Diagnosis not present

## 2021-11-25 DIAGNOSIS — I2510 Atherosclerotic heart disease of native coronary artery without angina pectoris: Secondary | ICD-10-CM | POA: Diagnosis not present

## 2022-01-03 ENCOUNTER — Ambulatory Visit: Admit: 2022-01-03 | Discharge: 2022-01-04 | Payer: MEDICARE

## 2022-01-03 DIAGNOSIS — M329 Systemic lupus erythematosus, unspecified: Secondary | ICD-10-CM | POA: Diagnosis not present

## 2022-01-03 DIAGNOSIS — Z6826 Body mass index (BMI) 26.0-26.9, adult: Secondary | ICD-10-CM | POA: Diagnosis not present

## 2022-01-03 MED ORDER — HYDROXYCHLOROQUINE 200 MG TABLET
ORAL_TABLET | Freq: Every day | ORAL | 3 refills | 90 days | Status: CP
Start: 2022-01-03 — End: ?

## 2022-01-07 DIAGNOSIS — H524 Presbyopia: Secondary | ICD-10-CM | POA: Diagnosis not present

## 2022-01-07 DIAGNOSIS — H04123 Dry eye syndrome of bilateral lacrimal glands: Secondary | ICD-10-CM | POA: Diagnosis not present

## 2022-01-07 DIAGNOSIS — Z79899 Other long term (current) drug therapy: Secondary | ICD-10-CM | POA: Diagnosis not present

## 2022-01-07 DIAGNOSIS — H2513 Age-related nuclear cataract, bilateral: Secondary | ICD-10-CM | POA: Diagnosis not present

## 2022-01-07 DIAGNOSIS — H40013 Open angle with borderline findings, low risk, bilateral: Secondary | ICD-10-CM | POA: Diagnosis not present

## 2022-01-12 DIAGNOSIS — M542 Cervicalgia: Secondary | ICD-10-CM | POA: Diagnosis not present

## 2022-01-12 DIAGNOSIS — G518 Other disorders of facial nerve: Secondary | ICD-10-CM | POA: Diagnosis not present

## 2022-01-12 DIAGNOSIS — M791 Myalgia, unspecified site: Secondary | ICD-10-CM | POA: Diagnosis not present

## 2022-01-12 DIAGNOSIS — G43719 Chronic migraine without aura, intractable, without status migrainosus: Secondary | ICD-10-CM | POA: Diagnosis not present

## 2022-01-13 DIAGNOSIS — M329 Systemic lupus erythematosus, unspecified: Principal | ICD-10-CM

## 2022-01-13 MED ORDER — HYDROXYCHLOROQUINE 200 MG TABLET
ORAL_TABLET | Freq: Every day | ORAL | 10 refills | 90 days
Start: 2022-01-13 — End: ?

## 2022-01-29 DIAGNOSIS — M329 Systemic lupus erythematosus, unspecified: Principal | ICD-10-CM

## 2022-01-29 MED ORDER — BENLYSTA 200 MG/ML SUBCUTANEOUS SYRINGE
3 refills | 0 days | Status: CP
Start: 2022-01-29 — End: 2023-01-29

## 2022-02-01 DIAGNOSIS — M329 Systemic lupus erythematosus, unspecified: Principal | ICD-10-CM

## 2022-02-02 ENCOUNTER — Other Ambulatory Visit: Payer: Self-pay | Admitting: Interventional Radiology

## 2022-02-02 DIAGNOSIS — D1771 Benign lipomatous neoplasm of kidney: Secondary | ICD-10-CM

## 2022-03-11 ENCOUNTER — Ambulatory Visit (HOSPITAL_COMMUNITY): Payer: Medicare PPO

## 2022-03-21 DIAGNOSIS — M329 Systemic lupus erythematosus, unspecified: Secondary | ICD-10-CM | POA: Diagnosis not present

## 2022-03-21 DIAGNOSIS — G43909 Migraine, unspecified, not intractable, without status migrainosus: Secondary | ICD-10-CM | POA: Diagnosis not present

## 2022-03-21 DIAGNOSIS — K635 Polyp of colon: Secondary | ICD-10-CM | POA: Diagnosis not present

## 2022-03-21 DIAGNOSIS — I10 Essential (primary) hypertension: Secondary | ICD-10-CM | POA: Diagnosis not present

## 2022-04-04 ENCOUNTER — Other Ambulatory Visit: Payer: Medicare PPO

## 2022-04-12 DIAGNOSIS — M791 Myalgia, unspecified site: Secondary | ICD-10-CM | POA: Diagnosis not present

## 2022-04-12 DIAGNOSIS — G518 Other disorders of facial nerve: Secondary | ICD-10-CM | POA: Diagnosis not present

## 2022-04-12 DIAGNOSIS — G43719 Chronic migraine without aura, intractable, without status migrainosus: Secondary | ICD-10-CM | POA: Diagnosis not present

## 2022-04-12 DIAGNOSIS — M542 Cervicalgia: Secondary | ICD-10-CM | POA: Diagnosis not present

## 2022-04-14 ENCOUNTER — Ambulatory Visit (HOSPITAL_COMMUNITY)
Admission: RE | Admit: 2022-04-14 | Discharge: 2022-04-14 | Disposition: A | Discharge status: Home / Self Care | Payer: Medicare HMO | Source: Ambulatory Visit | Attending: Interventional Radiology | Admitting: Interventional Radiology

## 2022-04-14 DIAGNOSIS — N2889 Other specified disorders of kidney and ureter: Secondary | ICD-10-CM | POA: Diagnosis not present

## 2022-04-14 DIAGNOSIS — D1771 Benign lipomatous neoplasm of kidney: Secondary | ICD-10-CM | POA: Diagnosis not present

## 2022-04-14 DIAGNOSIS — D1803 Hemangioma of intra-abdominal structures: Secondary | ICD-10-CM | POA: Diagnosis not present

## 2022-04-14 LAB — POCT I-STAT CREATININE: Creatinine, Ser: 1 mg/dL (ref 0.44–1.00)

## 2022-04-14 MED ORDER — IOHEXOL 300 MG/ML  SOLN
100.0000 mL | Freq: Once | INTRAMUSCULAR | Status: AC | PRN
  Administered 2022-04-14: 100 mL via INTRAVENOUS

## 2022-04-18 ENCOUNTER — Ambulatory Visit
Admission: RE | Admit: 2022-04-18 | Discharge: 2022-04-18 | Disposition: A | Discharge status: Home / Self Care | Payer: Medicare HMO | Source: Ambulatory Visit | Attending: Interventional Radiology | Admitting: Interventional Radiology

## 2022-04-18 DIAGNOSIS — D1771 Benign lipomatous neoplasm of kidney: Secondary | ICD-10-CM

## 2022-04-18 NOTE — Progress Notes (Signed)
Patient ID: Dawn Marsh, female   DOB: [birth year], 57 y.o.   MRN: [id number]       Chief Complaint:  Left renal angiomyolipoma, outpatient survey  Referring Physician(s):  Dr. Alyson Ingles  History of Present Illness: Dawn Marsh is a 57 y.o. female with a known history of left renal angiomyolipoma exophytic on the left lower pole.  She also had a previous left renal ablation for a benign renal lesion in 2013 which was consistent with a spindle cell neoplasm pathologically.  She has a chronic history of SLE and leukopenia as well.  Overall she continues to do very well and remains asymptomatic.  No urinary tract or renal symptoms.  No acute flank pain or abdominal pain.  No interval fever, dysuria or hematuria.  No recent illness or fever.  She had interval surveillance imaging at [facility] on 04/14/2022.  This redemonstrates the left lower pole renal angiomyolipoma, very minimal increase in size at 5.7 cm, previously 5.4 cm.  No interval signs of acute hemorrhage or hematoma.  No additional acute abnormality in the abdomen.  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   Hypertension    Left renal mass    Leukopenia 12/27/2012   Lupus ([place])    Migraine    Nontoxic simple goitre 12/27/2012   Rhinitis, allergic    seasonal   Seizures (HCC)    migraines   SLE (systemic lupus erythematosus) ([place]) 12/27/2012   Vertigo     Past Surgical History:  Procedure Laterality Date   ABDOMINAL HYSTERECTOMY     ABDOMINAL HYSTERECTOMY  2011   IR RADIOLOGIST EVAL & MGMT  01/12/2017   IR RADIOLOGIST EVAL & MGMT  02/07/2017   IR RADIOLOGIST EVAL & MGMT  12/07/2017   IR  RADIOLOGIST EVAL & MGMT  12/26/2018   IR RADIOLOGIST EVAL & MGMT  06/25/2019   IR RADIOLOGIST EVAL & MGMT  08/25/2020   IR RADIOLOGIST EVAL & MGMT  09/10/2021   IR RENAL SELECTIVE  UNI INC S&I MOD SED  11/12/2020   IR US GUIDE VASC ACCESS RIGHT  11/12/2020   KIDNEY SURGERY     MYOMECTOMY ABDOMINAL APPROACH     myomectomy x 2 (prior to the hysterectomy)  2002 and 2006   TUMOR REMOVAL      Allergies: Amoxicillin, Amoxil [amoxicillin trihydrate], Penicillins, and Latex  Medications: Prior to Admission medications   Medication Sig Start Date End Date Taking? Authorizing Provider  Belimumab (BENLYSTA) 200 MG/ML SOAJ Inject 200 mg into the skin every Friday.    [provider]  Calcium Carbonate-Vitamin D (CALCIUM 600+D PO) Take 1 tablet by mouth daily.    [provider]  Cholecalciferol (VITAMIN D) 50 MCG (2000 UT) tablet Take 2,000 Units by mouth daily.    [provider]  dexamethasone (DECADRON) 10 MG/ML injection Inject 10 mg into the vein every 8 (eight) weeks.    [provider]  DULoxetine (CYMBALTA) 30 MG capsule Take 30 mg by mouth daily.    [provider]  hydroxychloroquine (PLAQUENIL) 200 MG tablet Take 200 mg by mouth daily.    [provider]  naproxen sodium (  ALEVE) 220 MG tablet Take 220 mg by mouth daily as needed (pain).    [provider]  triamcinolone ointment (KENALOG) 0.1 % Apply 1 application topically 2 (two) times daily as needed (rash).    [provider]     No family history on file.  Social History   Socioeconomic History   Marital status: Divorced    Spouse name: Not on file   Number of children: Not on file   Years of education: Not on file   Highest education level: Not on file  Occupational History   Not on file  Tobacco Use   Smoking status: Never   Smokeless tobacco: Never  Substance and Sexual Activity   Alcohol use: Yes    Comment: occas-maybe once a month   Drug use: No    Sexual activity: Not on file  Other Topics Concern   Not on file  Social History Narrative   ** Merged History Encounter **       Social Determinants of Health   Financial Resource Strain: Not on file  Food Insecurity: Not on file  Transportation Needs: Not on file  Physical Activity: Not on file  Stress: Not on file  Social Connections: Not on file     Review of Systems  Review of Systems: A 12 point ROS discussed and pertinent positives are indicated in the HPI above.  All other systems are negative.    Physical Exam No direct physical exam was performed Telephone health visit only today to review surveillance imaging  Vital Signs: There were no vitals taken for this visit.  Imaging: CT ABDOMEN W WO CONTRAST  Result Date: 04/14/2022 CLINICAL DATA:  Follow-up renal mass status post cryoablation 2023. EXAM: CT ABDOMEN WITHOUT AND WITH CONTRAST TECHNIQUE: Multidetector CT imaging of the abdomen was performed following the standard protocol before and following the bolus administration of intravenous contrast. RADIATION DOSE REDUCTION: This exam was performed according to the departmental dose-optimization program which includes automated exposure control, adjustment of the mA and/or kV according to patient size and/or use of iterative reconstruction technique. CONTRAST:  142m OMNIPAQUE IOHEXOL 300 MG/ML  SOLN COMPARISON:  Multiple priors including most recent CT August 31, 2021. FINDINGS: Lower chest: No acute abnormality Hepatobiliary: Stable hepatic hemangioma in segment II measuring 17 mm on image 26/6. Gallbladder is unremarkable. No biliary ductal dilation Pancreas: No pancreatic ductal dilation or evidence of acute inflammation Spleen: No splenomegaly. Adrenals/Urinary Tract: Bilateral adrenal glands appear normal. No hydronephrosis.  No renal calculi. Heterogeneous enhancing macroscopic fat containing left lower pole renal lesion is compatible with an angiomyolipoma and  measures 5.7 x 4.5 x 4.2 cm (volume = 56 cm^3)on image 61/2 and 63/4 previously 5.4 x 4.4 x 4.3 cm (volume = 53 cm^3)when remeasured for consistency. Stable small 7 mm right upper pole renal angiomyolipoma on image 46/2. Similar cortical scarring involving the lateral left kidney. Stomach/Bowel: Stomach is unremarkable for degree of distension. No pathologic dilation or evidence of acute inflammation involving loops of large or small bowel in the abdomen. Vascular/Lymphatic: Normal caliber abdominal aorta. Smooth IVC contours. The portal, splenic and superior mesenteric veins are patent. Renal veins are patent. No pathologically enlarged abdominal lymph nodes. Other: No significant abdominopelvic free fluid Musculoskeletal: No acute osseous abnormality. IMPRESSION: 1. Slight interval increase in size in the 5.7 cm left lower pole renal angiomyolipoma. 2. Stable 7 mm right upper pole renal angiomyolipoma. 3. Stable 17 mm hepatic hemangioma. Electronically Signed   By: JDellis Filbert  Nance Pew M.D.   On: 04/14/2022 10:25    Labs:  CBC: No results for input(s): "WBC", "HGB", "HCT", "PLT" in the last 8760 hours.  COAGS: No results for input(s): "INR", "APTT" in the last 8760 hours.  BMP: Recent Labs    08/31/21 0816 04/14/22 0828  CREATININE 1.00 1.00    LIVER FUNCTION TESTS: No results for input(s): "BILITOT", "AST", "ALT", "ALKPHOS", "PROT", "ALBUMIN" in the last 8760 hours.   Assessment and Plan:  Trace interval enlargement of the exophytic left lower pole renal angiomyolipoma, remeasured today and on prior exams for consistency.  Maximal diameter is 5.7 cm, previously 5.4 cm.  I do agree that there has been very slight interval enlargement compared to the prior study 6 months ago.  Previous attempted angioembolization 11/12/2020 was unsuccessful.  We discussed treatment options including repeat attempt at angioembolization preferably at [facility] because of the advanced IR imaging suite to  aid in vessel mapping if performed.  Also continue surveillance was discussed.  All questions addressed.  After discussion she would like to continue with conservative management and continued surveillance at 6 months.  She understands there is a low chance of hemorrhage and what the signs and symptoms are to watch for.  Plan: Continue CTA surveillance at 6 months at [place] second attempt angioembolization at [facility] utilizing the [place] room with vessel navigation.    Electronically Signed: Greggory Keen 04/18/2022, 11:19 AM   I spent a total of    40 Minutes in remote  clinical consultation, greater than 50% of which was counseling/coordinating care for This patient with a renal angiomyolipoma.    Visit type: Audio only (telephone). Audio (no video) only due to patient's lack of internet/smartphone capability. Alternative for in-person consultation at [facility], [place] [address] [place], [place], [place].    This format is felt to be most appropriate for this patient at this time.  All issues noted in this document were discussed and addressed.

## 2022-05-04 DIAGNOSIS — G5603 Carpal tunnel syndrome, bilateral upper limbs: Secondary | ICD-10-CM | POA: Diagnosis not present

## 2022-05-04 DIAGNOSIS — G629 Polyneuropathy, unspecified: Secondary | ICD-10-CM | POA: Diagnosis not present

## 2022-05-23 DIAGNOSIS — L280 Lichen simplex chronicus: Secondary | ICD-10-CM | POA: Diagnosis not present

## 2022-05-23 DIAGNOSIS — L910 Hypertrophic scar: Secondary | ICD-10-CM | POA: Diagnosis not present

## 2022-06-07 DIAGNOSIS — Z1151 Encounter for screening for human papillomavirus (HPV): Secondary | ICD-10-CM | POA: Diagnosis not present

## 2022-06-07 DIAGNOSIS — Z780 Asymptomatic menopausal state: Secondary | ICD-10-CM | POA: Diagnosis not present

## 2022-06-07 DIAGNOSIS — Z1231 Encounter for screening mammogram for malignant neoplasm of breast: Secondary | ICD-10-CM | POA: Diagnosis not present

## 2022-06-07 DIAGNOSIS — M542 Cervicalgia: Secondary | ICD-10-CM | POA: Diagnosis not present

## 2022-06-07 DIAGNOSIS — Z1272 Encounter for screening for malignant neoplasm of vagina: Secondary | ICD-10-CM | POA: Diagnosis not present

## 2022-06-07 DIAGNOSIS — G518 Other disorders of facial nerve: Secondary | ICD-10-CM | POA: Diagnosis not present

## 2022-06-07 DIAGNOSIS — M791 Myalgia, unspecified site: Secondary | ICD-10-CM | POA: Diagnosis not present

## 2022-06-07 DIAGNOSIS — Z124 Encounter for screening for malignant neoplasm of cervix: Secondary | ICD-10-CM | POA: Diagnosis not present

## 2022-06-07 DIAGNOSIS — Z6827 Body mass index (BMI) 27.0-27.9, adult: Secondary | ICD-10-CM | POA: Diagnosis not present

## 2022-06-07 DIAGNOSIS — G43719 Chronic migraine without aura, intractable, without status migrainosus: Secondary | ICD-10-CM | POA: Diagnosis not present

## 2022-07-25 DIAGNOSIS — M25562 Pain in left knee: Secondary | ICD-10-CM | POA: Diagnosis not present

## 2022-07-25 DIAGNOSIS — M65332 Trigger finger, left middle finger: Secondary | ICD-10-CM | POA: Diagnosis not present

## 2022-07-25 DIAGNOSIS — M1712 Unilateral primary osteoarthritis, left knee: Secondary | ICD-10-CM | POA: Diagnosis not present

## 2022-08-09 DIAGNOSIS — M542 Cervicalgia: Secondary | ICD-10-CM | POA: Diagnosis not present

## 2022-08-09 DIAGNOSIS — M791 Myalgia, unspecified site: Secondary | ICD-10-CM | POA: Diagnosis not present

## 2022-08-09 DIAGNOSIS — G43719 Chronic migraine without aura, intractable, without status migrainosus: Secondary | ICD-10-CM | POA: Diagnosis not present

## 2022-08-09 DIAGNOSIS — G518 Other disorders of facial nerve: Secondary | ICD-10-CM | POA: Diagnosis not present

## 2022-09-07 DIAGNOSIS — E7849 Other hyperlipidemia: Secondary | ICD-10-CM | POA: Diagnosis not present

## 2022-09-07 DIAGNOSIS — F419 Anxiety disorder, unspecified: Secondary | ICD-10-CM | POA: Diagnosis not present

## 2022-09-07 DIAGNOSIS — Z6825 Body mass index (BMI) 25.0-25.9, adult: Secondary | ICD-10-CM | POA: Diagnosis not present

## 2022-09-07 DIAGNOSIS — Z1331 Encounter for screening for depression: Secondary | ICD-10-CM | POA: Diagnosis not present

## 2022-09-07 DIAGNOSIS — M329 Systemic lupus erythematosus, unspecified: Secondary | ICD-10-CM | POA: Diagnosis not present

## 2022-09-07 DIAGNOSIS — D550 Anemia due to glucose-6-phosphate dehydrogenase [G6PD] deficiency: Secondary | ICD-10-CM | POA: Diagnosis not present

## 2022-09-07 DIAGNOSIS — E559 Vitamin D deficiency, unspecified: Secondary | ICD-10-CM | POA: Diagnosis not present

## 2022-09-07 DIAGNOSIS — E782 Mixed hyperlipidemia: Secondary | ICD-10-CM | POA: Diagnosis not present

## 2022-09-07 DIAGNOSIS — Z0000 Encounter for general adult medical examination without abnormal findings: Secondary | ICD-10-CM | POA: Diagnosis not present

## 2022-09-07 DIAGNOSIS — M797 Fibromyalgia: Secondary | ICD-10-CM | POA: Diagnosis not present

## 2022-09-27 ENCOUNTER — Other Ambulatory Visit: Payer: Self-pay | Admitting: Orthopedic Surgery

## 2022-09-27 DIAGNOSIS — M65332 Trigger finger, left middle finger: Secondary | ICD-10-CM | POA: Diagnosis not present

## 2022-09-27 NOTE — Progress Notes (Signed)
 Patient is a very pleasant 57 year old right-hand-dominant female who presents today for evaluation and treatment of triggering of the left middle finger.  She states this been present for about 6 months.  No specific injury that she can recall.  She was seen and evaluated by sports medicine group.  About 6 weeks ago.  She underwent injection at that point time.  She states that that helped for about 3 weeks but her triggering symptoms recurred.  She has been splinting at night which does help the early morning splinting.  She is also noticed a large knot in and around the area of the A1 pulley of the left middle finger.  She states that this is particularly painful especially with gripping activities.  Review of her past medical history reveals allergies to both amoxicillin and penicillin.  She also has a latex allergy.  She also has a history of anxiety, lupus and Sjogren's.  Patient also relates a history of heart murmur which does not need antibiotic prophylaxis.  She is undergone previous hysterectomy in 2007 without anesthetic complication.  Patient does not smoke or consume alcohol.  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  Exam: On exam patient is a 57 year old female who appears stated age no acute distress.  She ambulates without difficulty.  She is alert and oriented x 3.  She is cooperative during exam and answers questions appropriately.  Examination of her left hand reveals no areas of skin breakdown, rash or irritation.  Neurovascularly she is intact throughout the median and ulnar distribution of the hand.  She has full extension of her fingers but does have active triggering of the left middle finger.  She also has a 1.2 cm cystic mass at the A1 pulley of the left middle finger.  This is painful to palpation.  There is no signs of infection with no surrounding erythema.  She has full range of motion of her wrist and full pronation supination of forearm.  She has  good intrinsic strength.  She has good biceps and tricep strength.  Plan: At this time we lengthy discussion with the patient concerning continued treatment of this predicament.  We discussed options including continued observance and splinting, repeat injection, and surgical intervention.  She wishes to proceed with surgical excision of the retinacular cyst in addition to release of the A1 pulley of the left middle finger.  We discussed the risk benefits and the postop course with the patient length and she was in agreement with this plan.  Patient will therefore be scheduled to undergo the above listed surgery by Dr. Kevin Kuzma.  She will return to see us  1 week after her surgical procedure for dressing change.  Questions were invited and answered to her satisfaction.

## 2022-10-23 DIAGNOSIS — M329 Systemic lupus erythematosus, unspecified: Principal | ICD-10-CM

## 2022-10-23 MED ORDER — HYDROXYCHLOROQUINE 200 MG TABLET
ORAL_TABLET | Freq: Every day | ORAL | 3 refills | 0 days
Start: 2022-10-23 — End: ?

## 2022-10-24 MED ORDER — HYDROXYCHLOROQUINE 200 MG TABLET
ORAL_TABLET | Freq: Every day | ORAL | 3 refills | 90 days | Status: CP
Start: 2022-10-24 — End: ?

## 2022-10-27 DIAGNOSIS — M791 Myalgia, unspecified site: Secondary | ICD-10-CM | POA: Diagnosis not present

## 2022-10-27 DIAGNOSIS — G518 Other disorders of facial nerve: Secondary | ICD-10-CM | POA: Diagnosis not present

## 2022-10-27 DIAGNOSIS — G43719 Chronic migraine without aura, intractable, without status migrainosus: Secondary | ICD-10-CM | POA: Diagnosis not present

## 2022-10-27 DIAGNOSIS — M542 Cervicalgia: Secondary | ICD-10-CM | POA: Diagnosis not present

## 2022-11-02 ENCOUNTER — Ambulatory Visit: Admit: 2022-11-02 | Discharge: 2022-11-03 | Payer: MEDICARE

## 2022-11-02 DIAGNOSIS — Z79899 Other long term (current) drug therapy: Secondary | ICD-10-CM | POA: Diagnosis not present

## 2022-11-02 DIAGNOSIS — I2510 Atherosclerotic heart disease of native coronary artery without angina pectoris: Secondary | ICD-10-CM | POA: Diagnosis not present

## 2022-11-02 DIAGNOSIS — M3219 Other organ or system involvement in systemic lupus erythematosus: Secondary | ICD-10-CM | POA: Diagnosis not present

## 2022-11-02 MED ORDER — ROSUVASTATIN 10 MG TABLET
ORAL_TABLET | Freq: Every day | ORAL | 3 refills | 90 days | Status: CP
Start: 2022-11-02 — End: 2023-11-02

## 2022-11-09 ENCOUNTER — Encounter (HOSPITAL_BASED_OUTPATIENT_CLINIC_OR_DEPARTMENT_OTHER): Payer: Self-pay | Admitting: Orthopedic Surgery

## 2022-11-09 ENCOUNTER — Other Ambulatory Visit: Payer: Self-pay

## 2022-11-16 NOTE — Anesthesia Preprocedure Evaluation (Addendum)
Anesthesia Evaluation  Patient identified by MRN, date of birth, ID band Patient awake    Reviewed: Allergy & Precautions, NPO status , Patient's Chart, lab work & pertinent test results  History of Anesthesia Complications (+) PONV and history of anesthetic complications  Airway Mallampati: II  TM Distance: >3 FB Neck ROM: Full    Dental no notable dental hx. (+) Teeth Intact, Dental Advisory Given   Pulmonary    Pulmonary exam normal breath sounds clear to auscultation       Cardiovascular Exercise Tolerance: Good Normal cardiovascular exam Rhythm:Regular Rate:Normal     Neuro/Psych  Headaches, Seizures -,  PSYCHIATRIC DISORDERS Anxiety      Neuromuscular disease    GI/Hepatic ,GERD  ,,  Endo/Other    Renal/GU      Musculoskeletal  (+)  Fibromyalgia -SLE   Abdominal   Peds  Hematology   Anesthesia Other Findings All: Amoxicillin latex  Reproductive/Obstetrics                             Anesthesia Physical Anesthesia Plan  ASA: 2  Anesthesia Plan: Regional and MAC   Post-op Pain Management: Tylenol PO (pre-op)*, Regional block* and Minimal or no pain anticipated   Induction:   PONV Risk Score and Plan: 2 and Treatment may vary due to age or medical condition, Midazolam and Propofol infusion  Airway Management Planned: Nasal Cannula and Natural Airway  Additional Equipment: None  Intra-op Plan:   Post-operative Plan:   Informed Consent: I have reviewed the patients History and Physical, chart, labs and discussed the procedure including the risks, benefits and alternatives for the proposed anesthesia with the patient or authorized representative who has indicated his/her understanding and acceptance.     Dental advisory given  Plan Discussed with:   Anesthesia Plan Comments: (L supra clav)        Anesthesia Quick Evaluation

## 2022-11-17 ENCOUNTER — Encounter (HOSPITAL_BASED_OUTPATIENT_CLINIC_OR_DEPARTMENT_OTHER): Payer: Self-pay | Admitting: Orthopedic Surgery

## 2022-11-17 ENCOUNTER — Encounter (HOSPITAL_BASED_OUTPATIENT_CLINIC_OR_DEPARTMENT_OTHER)
Admission: RE | Disposition: A | Discharge status: Home / Self Care | Payer: Self-pay | Source: Ambulatory Visit | Attending: Orthopedic Surgery

## 2022-11-17 ENCOUNTER — Other Ambulatory Visit: Payer: Self-pay

## 2022-11-17 ENCOUNTER — Ambulatory Visit (HOSPITAL_BASED_OUTPATIENT_CLINIC_OR_DEPARTMENT_OTHER): Payer: Medicare PPO | Admitting: Anesthesiology

## 2022-11-17 ENCOUNTER — Ambulatory Visit (HOSPITAL_BASED_OUTPATIENT_CLINIC_OR_DEPARTMENT_OTHER)
Admission: RE | Admit: 2022-11-17 | Discharge: 2022-11-17 | Disposition: A | Discharge status: Home / Self Care | Payer: Medicare PPO | Source: Ambulatory Visit | Attending: Orthopedic Surgery | Admitting: Orthopedic Surgery

## 2022-11-17 DIAGNOSIS — M329 Systemic lupus erythematosus, unspecified: Secondary | ICD-10-CM | POA: Diagnosis not present

## 2022-11-17 DIAGNOSIS — Z01818 Encounter for other preprocedural examination: Secondary | ICD-10-CM

## 2022-11-17 DIAGNOSIS — M65332 Trigger finger, left middle finger: Secondary | ICD-10-CM | POA: Diagnosis not present

## 2022-11-17 DIAGNOSIS — M797 Fibromyalgia: Secondary | ICD-10-CM | POA: Insufficient documentation

## 2022-11-17 DIAGNOSIS — M67442 Ganglion, left hand: Secondary | ICD-10-CM | POA: Insufficient documentation

## 2022-11-17 DIAGNOSIS — F419 Anxiety disorder, unspecified: Secondary | ICD-10-CM | POA: Diagnosis not present

## 2022-11-17 HISTORY — PX: CYST EXCISION: SHX5701

## 2022-11-17 HISTORY — PX: TRIGGER FINGER RELEASE: SHX641

## 2022-11-17 HISTORY — DX: Other specified postprocedural states: Z98.890

## 2022-11-17 HISTORY — DX: Fibromyalgia: M79.7

## 2022-11-17 SURGERY — RELEASE, A1 PULLEY, FOR TRIGGER FINGER
Anesthesia: Monitor Anesthesia Care | Site: Middle Finger | Laterality: Left

## 2022-11-17 MED ORDER — LACTATED RINGERS IV SOLN: INTRAVENOUS | Status: DC

## 2022-11-17 MED ORDER — GLYCOPYRROLATE PF 0.2 MG/ML IJ SOSY
PREFILLED_SYRINGE | INTRAMUSCULAR | Status: DC | PRN
  Administered 2022-11-17: .2 mg via INTRAVENOUS

## 2022-11-17 MED ORDER — MIDAZOLAM HCL 2 MG/2ML IJ SOLN
2.0000 mg | Freq: Once | INTRAMUSCULAR | Status: AC
  Administered 2022-11-17: 2 mg via INTRAVENOUS

## 2022-11-17 MED ORDER — BUPIVACAINE HCL (PF) 0.5 % IJ SOLN
INTRAMUSCULAR | Status: DC | PRN
Start: 2022-11-17 — End: 2022-11-17
  Administered 2022-11-17: 10 mL via PERINEURAL

## 2022-11-17 MED ORDER — HYDROMORPHONE HCL 1 MG/ML IJ SOLN: 0.2500 mg | INTRAMUSCULAR | Status: DC | PRN

## 2022-11-17 MED ORDER — CEFAZOLIN SODIUM-DEXTROSE 2-4 GM/100ML-% IV SOLN
INTRAVENOUS | Status: AC
  Filled 2022-11-17: qty 100

## 2022-11-17 MED ORDER — OXYCODONE HCL 5 MG PO TABS: 5.0000 mg | ORAL_TABLET | Freq: Once | ORAL | Status: DC | PRN

## 2022-11-17 MED ORDER — KETOROLAC TROMETHAMINE 30 MG/ML IJ SOLN: 30.0000 mg | Freq: Once | INTRAMUSCULAR | Status: DC | PRN

## 2022-11-17 MED ORDER — OXYCODONE HCL 5 MG/5ML PO SOLN: 5.0000 mg | Freq: Once | ORAL | Status: DC | PRN

## 2022-11-17 MED ORDER — ACETAMINOPHEN 500 MG PO TABS
1000.0000 mg | ORAL_TABLET | Freq: Once | ORAL | Status: AC
  Administered 2022-11-17: 1000 mg via ORAL

## 2022-11-17 MED ORDER — PROPOFOL 500 MG/50ML IV EMUL
INTRAVENOUS | Status: DC | PRN
  Administered 2022-11-17: 100 ug/kg/min via INTRAVENOUS

## 2022-11-17 MED ORDER — ONDANSETRON HCL 4 MG/2ML IJ SOLN: 4.0000 mg | Freq: Once | INTRAMUSCULAR | Status: DC | PRN

## 2022-11-17 MED ORDER — LIDOCAINE-EPINEPHRINE (PF) 1.5 %-1:200000 IJ SOLN
INTRAMUSCULAR | Status: DC | PRN
Start: 2022-11-17 — End: 2022-11-17
  Administered 2022-11-17: 10 mL via PERINEURAL

## 2022-11-17 MED ORDER — CEFAZOLIN SODIUM-DEXTROSE 2-4 GM/100ML-% IV SOLN
2.0000 g | INTRAVENOUS | Status: AC
  Administered 2022-11-17: 2 g via INTRAVENOUS

## 2022-11-17 MED ORDER — ACETAMINOPHEN-CODEINE 300-30 MG PO TABS
1.0000 | ORAL_TABLET | Freq: Four times a day (QID) | ORAL | 0 refills | Status: AC | PRN
Start: 2022-11-17 — End: ?

## 2022-11-17 MED ORDER — FENTANYL CITRATE (PF) 100 MCG/2ML IJ SOLN
INTRAMUSCULAR | Status: AC
  Filled 2022-11-17: qty 2

## 2022-11-17 MED ORDER — MIDAZOLAM HCL 2 MG/2ML IJ SOLN
INTRAMUSCULAR | Status: AC
  Filled 2022-11-17: qty 2

## 2022-11-17 MED ORDER — FENTANYL CITRATE (PF) 100 MCG/2ML IJ SOLN
100.0000 ug | Freq: Once | INTRAMUSCULAR | Status: AC
  Administered 2022-11-17: 50 ug via INTRAVENOUS

## 2022-11-17 MED ORDER — PROPOFOL 10 MG/ML IV BOLUS
INTRAVENOUS | Status: DC | PRN
  Administered 2022-11-17: 30 mg via INTRAVENOUS
  Administered 2022-11-17: 20 mg via INTRAVENOUS

## 2022-11-17 MED ORDER — ACETAMINOPHEN 500 MG PO TABS
ORAL_TABLET | ORAL | Status: AC
  Filled 2022-11-17: qty 2

## 2022-11-17 SURGICAL SUPPLY — 56 items
APL PRP STRL LF DISP 70% ISPRP (MISCELLANEOUS) ×1
APL SKNCLS STERI-STRIP NONHPOA (GAUZE/BANDAGES/DRESSINGS)
BANDAGE GAUZE 1X75IN STRL (MISCELLANEOUS) IMPLANT
BENZOIN TINCTURE PRP APPL 2/3 (GAUZE/BANDAGES/DRESSINGS) IMPLANT
BLADE MINI RND TIP GREEN BEAV (BLADE) IMPLANT
BLADE SURG 15 STRL LF DISP TIS (BLADE) ×2 IMPLANT
BLADE SURG 15 STRL SS (BLADE) ×2
BNDG CMPR 5X2 CHSV 1 LYR STRL (GAUZE/BANDAGES/DRESSINGS) ×1
BNDG CMPR 5X2 KNTD ELC UNQ LF (GAUZE/BANDAGES/DRESSINGS)
BNDG CMPR 5X3 KNIT ELC UNQ LF (GAUZE/BANDAGES/DRESSINGS)
BNDG CMPR 75X11 PLY HI ABS (MISCELLANEOUS)
BNDG CMPR 75X21 PLY HI ABS (MISCELLANEOUS)
BNDG CMPR 9X4 STRL LF SNTH (GAUZE/BANDAGES/DRESSINGS)
BNDG COHESIVE 1X5 TAN STRL LF (GAUZE/BANDAGES/DRESSINGS) IMPLANT
BNDG COHESIVE 2X5 TAN ST LF (GAUZE/BANDAGES/DRESSINGS) ×1 IMPLANT
BNDG ELASTIC 2INX 5YD STR LF (GAUZE/BANDAGES/DRESSINGS) IMPLANT
BNDG ELASTIC 3INX 5YD STR LF (GAUZE/BANDAGES/DRESSINGS) IMPLANT
BNDG ESMARK 4X9 LF (GAUZE/BANDAGES/DRESSINGS) IMPLANT
BNDG GAUZE 1X75IN STRL (MISCELLANEOUS)
BNDG GAUZE DERMACEA FLUFF 4 (GAUZE/BANDAGES/DRESSINGS) IMPLANT
BNDG GZE DERMACEA 4 6PLY (GAUZE/BANDAGES/DRESSINGS)
BNDG PLASTER X FAST 3X3 WHT LF (CAST SUPPLIES) IMPLANT
BNDG PLSTR 9X3 FST ST WHT (CAST SUPPLIES)
CHLORAPREP W/TINT 26 (MISCELLANEOUS) ×1 IMPLANT
CORD BIPOLAR FORCEPS 12FT (ELECTRODE) ×1 IMPLANT
COVER BACK TABLE 60X90IN (DRAPES) ×1 IMPLANT
COVER MAYO STAND STRL (DRAPES) ×1 IMPLANT
CUFF TOURN SGL QUICK 18X4 (TOURNIQUET CUFF) ×1 IMPLANT
DRAPE EXTREMITY T 121X128X90 (DISPOSABLE) ×1 IMPLANT
DRAPE SURG 17X23 STRL (DRAPES) ×1 IMPLANT
GAUZE SPONGE 4X4 12PLY STRL (GAUZE/BANDAGES/DRESSINGS) ×1 IMPLANT
GAUZE STRETCH 2X75IN STRL (MISCELLANEOUS) IMPLANT
GAUZE XEROFORM 1X8 LF (GAUZE/BANDAGES/DRESSINGS) ×1 IMPLANT
GLOVE BIO SURGEON STRL SZ7.5 (GLOVE) ×1 IMPLANT
GLOVE BIOGEL PI IND STRL 8 (GLOVE) ×1 IMPLANT
GOWN STRL REUS W/ TWL LRG LVL3 (GOWN DISPOSABLE) ×1 IMPLANT
GOWN STRL REUS W/TWL LRG LVL3 (GOWN DISPOSABLE) ×1
GOWN STRL REUS W/TWL XL LVL3 (GOWN DISPOSABLE) ×1 IMPLANT
NDL HYPO 25X1 1.5 SAFETY (NEEDLE) ×1 IMPLANT
NEEDLE HYPO 25X1 1.5 SAFETY (NEEDLE) ×1
NS IRRIG 1000ML POUR BTL (IV SOLUTION) ×1 IMPLANT
PACK BASIN DAY SURGERY FS (CUSTOM PROCEDURE TRAY) ×1 IMPLANT
PAD CAST 3X4 CTTN HI CHSV (CAST SUPPLIES) IMPLANT
PAD CAST 4YDX4 CTTN HI CHSV (CAST SUPPLIES) IMPLANT
PADDING CAST ABS COTTON 4X4 ST (CAST SUPPLIES) ×1 IMPLANT
PADDING CAST COTTON 3X4 STRL (CAST SUPPLIES)
PADDING CAST COTTON 4X4 STRL (CAST SUPPLIES)
SLING ARM FOAM STRAP LRG (SOFTGOODS) IMPLANT
STOCKINETTE 4X48 STRL (DRAPES) ×1 IMPLANT
STRIP CLOSURE SKIN 1/2X4 (GAUZE/BANDAGES/DRESSINGS) IMPLANT
SUT ETHILON 3 0 PS 1 (SUTURE) IMPLANT
SUT ETHILON 4 0 PS 2 18 (SUTURE) ×1 IMPLANT
SYR BULB EAR ULCER 3OZ GRN STR (SYRINGE) ×1 IMPLANT
SYR CONTROL 10ML LL (SYRINGE) ×1 IMPLANT
TOWEL GREEN STERILE FF (TOWEL DISPOSABLE) ×2 IMPLANT
UNDERPAD 30X36 HEAVY ABSORB (UNDERPADS AND DIAPERS) ×1 IMPLANT

## 2022-11-17 NOTE — Transfer of Care (Signed)
Immediate Anesthesia Transfer of Care Note  Patient: Dawn Marsh  Procedure(s) Performed: RELEASE TRIGGER FINGER/A-1 PULLEY LEFT MIDDLE FINGER (Left: Middle Finger) EXCISION RETINACULAR CYST LEFT MIDDLE FINGER (Left: Middle Finger)  Patient Location: PACU  Anesthesia Type:MAC  Level of Consciousness: awake and patient cooperative  Airway & Oxygen Therapy: Patient Spontanous Breathing and Patient connected to face mask oxygen  Post-op Assessment: Report given to RN and Post -op Vital signs reviewed and stable  Post vital signs: Reviewed and stable  Last Vitals:  Vitals Value Taken Time  BP    Temp 36.4 C 11/17/22 1022  Pulse 119 11/17/22 1025  Resp 24 11/17/22 1025  SpO2 95 % 11/17/22 1025  Vitals shown include unfiled device data.  Last Pain:  Vitals:   11/17/22 0903  TempSrc: Tympanic  PainSc: 0-No pain      Patients Stated Pain Goal: 8 (11/17/22 0903)  Complications: No notable events documented.

## 2022-11-17 NOTE — Anesthesia Postprocedure Evaluation (Signed)
Anesthesia Post Note  Patient: Dawn Marsh  Procedure(s) Performed: RELEASE TRIGGER FINGER/A-1 PULLEY LEFT MIDDLE FINGER (Left: Middle Finger) EXCISION RETINACULAR CYST LEFT MIDDLE FINGER (Left: Middle Finger)     Patient location during evaluation: PACU Anesthesia Type: Regional Level of consciousness: awake and alert Pain management: pain level controlled Vital Signs Assessment: post-procedure vital signs reviewed and stable Respiratory status: spontaneous breathing, nonlabored ventilation, respiratory function stable and patient connected to nasal cannula oxygen Cardiovascular status: stable and blood pressure returned to baseline Postop Assessment: no apparent nausea or vomiting Anesthetic complications: no   No notable events documented.  Last Vitals:  Vitals:   11/17/22 1043 11/17/22 1055  BP: (!) 158/98 (!) 160/90  Pulse: 100 98  Resp: 18 17  Temp:  (!) 36.3 C  SpO2: 94% 96%    Last Pain:  Vitals:   11/17/22 1055  TempSrc:   PainSc: 0-No pain                 Trevor Iha

## 2022-11-17 NOTE — Op Note (Signed)
11/17/2022 [facility]  Operative Note  PREOPERATIVE DIAGNOSIS: TRIGGER LEFT MIDDLE FINGER WITH RETINACULAR CYST  POSTOPERATIVE DIAGNOSIS:  TRIGGER LEFT MIDDLE FINGER WITH RETINACULAR CYST  PROCEDURE: Procedure(s): RELEASE TRIGGER FINGER/A-1 PULLEY LEFT MIDDLE FINGER EXCISION RETINACULAR CYST LEFT MIDDLE FINGER   SURGEON:  Betha Loa, MD  ASSISTANT:  none.  ANESTHESIA:  Regional with sedation.  IV FLUIDS:  Per anesthesia flow sheet.  ESTIMATED BLOOD LOSS:  Minimal.  COMPLICATIONS:  None.  SPECIMENS:  left long finger cyst to pathology  TOURNIQUET TIME: Left forearm: Approximately 12 minutes at 250 mmHg  DISPOSITION:  Stable to PACU.  LOCATION: [facility]  INDICATIONS: Dawn Marsh is a 57 y.o. female with triggering left long finger and cyst at level of A1 pulley.  She wishes to have trigger release and excision of retinacular cyst.  Risks, benefits and alternatives of surgery were discussed including the risk of blood loss, infection, damage to nerves, vessels, tendons, ligaments, bone, failure of surgery, need for additional surgery, complications with wound healing, continued pain, continued triggering and need for repeat surgery.  She voiced understanding of these risks and elected to proceed.  OPERATIVE COURSE:  After being identified preoperatively by myself, the patient and I agreed upon the procedure and site of procedure.  The surgical site was marked. Surgical consent had been signed. She was given IV Ancef as preoperative antibiotic prophylaxis. She was transported to the operating room and placed on the operating room table in supine position with the Left Left upper extremity on an arm board. Sedation was induced by the anesthesiologist. and A regional block had been performed by anesthesia in preoperative holding.    The Left Left upper extremity was prepped and draped in normal sterile orthopedic fashion. A surgical pause was  performed between surgeons, anesthesia, and operating room staff, and all were in agreement as to the patient, procedure, and site of procedure.  Tourniquet at the proximal aspect of the forearm was inflated to 250 mmHg after exsanguination of the arm with an Esmarch bandage.  An incision was made at the volar aspect of the MP joint of the long finger.  This was carried into the subcutaneous tissues by spreading technique.  There was a cyst coming from the volar aspect of the A1 pulley.  This was filled with clear gelatinous fluid.  It was removed and sent to pathology for examination.  The radial and ulnar digital nerves were protected throughout the case. The flexor sheath was identified.  The A1 pulley was identified and sharply incised.  It was thickened.  It was released in its entirety.  The proximal 1-2 mm of the A2 pulley was vented to allow better excursion of the tendons.  The finger was placed through a range of motion and there was noted to be no catching.  The tendons were brought through the wound and any adherences released.  The wound was then copiously irrigated with sterile saline. It was closed with 4-0 nylon in a horizontal mattress fashion.  It was injected with 0.25% plain Marcaine to aid in postoperative analgesia.  It was dressed with sterile Xeroform, 4x4s, and wrapped lightly with a Coban dressing.  Tourniquet was deflated at 12 minutes.  The fingertips were pink with brisk capillary refill after deflation of the tourniquet.  The operative drapes were broken down and the patient was awoken from anesthesia safely.  She was transferred back to the stretcher and taken to the PACU in stable condition.  I will see her back in the office in 1 week for postoperative followup.  I will give her a prescription for Tylenol #3 1 p.o. every 6 hours as needed pain dispense #15.    Betha Loa, MD Electronically signed, 11/17/22

## 2022-11-17 NOTE — H&P (Signed)
Dawn Marsh is an 57 y.o. female.   Chief Complaint: trigger digit HPI: 57 yo female with triggering left long finger and cyst at volar mp joint of long finger.  These are bothersome to her.  She wishes to proceed with left long finger trigger release and excision of annular ligament cyst.  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   Left renal mass    Leukopenia 12/27/2012   Lupus (HCC)    Migraine    Nontoxic simple goitre 12/27/2012   PONV (postoperative nausea and vomiting)    Rhinitis, allergic    seasonal   Seizures (HCC)    migraines   SLE (systemic lupus erythematosus) (HCC) 12/27/2012   Vertigo     Past Surgical History:  Procedure Laterality Date   ABDOMINAL HYSTERECTOMY     ABDOMINAL HYSTERECTOMY  2011   IR RADIOLOGIST EVAL & MGMT  01/12/2017   IR RADIOLOGIST EVAL & MGMT  02/07/2017   IR RADIOLOGIST EVAL & MGMT  12/07/2017   IR RADIOLOGIST EVAL & MGMT  12/26/2018   IR RADIOLOGIST EVAL & MGMT  06/25/2019   IR RADIOLOGIST EVAL & MGMT  08/25/2020   IR RADIOLOGIST EVAL & MGMT  09/10/2021   IR RENAL SELECTIVE  UNI INC S&I MOD SED  11/12/2020   IR US GUIDE VASC ACCESS RIGHT  11/12/2020   KIDNEY SURGERY     MYOMECTOMY ABDOMINAL APPROACH     myomectomy x 2 (prior to the hysterectomy)  2002 and 2006   TUMOR REMOVAL      Family History: History reviewed. No pertinent family history.  Social History:   reports that she has never smoked. She has never used smokeless  tobacco. She reports that she does not currently use alcohol. She reports that she does not use drugs.  Medications: Medications Prior to Admission  Medication Sig Dispense Refill   Belimumab (BENLYSTA) 200 MG/ML SOAJ Inject 200 mg into the skin every Friday.     dexamethasone (DECADRON) 10 MG/ML injection Inject 10 mg into the vein every 8 (eight) weeks.     DULoxetine (CYMBALTA) 30 MG capsule Take 30 mg by mouth 2 (two) times daily.     hydroxychloroquine (PLAQUENIL) 200 MG tablet Take 200 mg by mouth daily.     naproxen sodium (ALEVE) 220 MG tablet Take 220 mg by mouth daily as needed (pain).     triamcinolone ointment (KENALOG) 0.1 % Apply 1 application topically 2 (two) times daily as needed (rash).      No results found for this or any previous visit (from the past 48 hour(s)).  No results found.    Blood pressure (!) 134/101, pulse 79, temperature (!) 97.1 F (36.2 C), temperature source Tympanic, resp. rate 13, height 5\' 5"  (1.651 m), weight 74.2 kg, SpO2 98%.  General appearance: alert, cooperative, and appears stated age Head: Normocephalic, without obvious abnormality, atraumatic Neck: supple, symmetrical, trachea midline Extremities: Intact sensation  and capillary refill all digits.  +epl/fpl/io.  No wounds. Triggering left long finger and palpable mass at level of A1 pulley. Pulses: 2+ and symmetric Skin: Skin color, texture, turgor normal. No rashes or lesions Neurologic: Grossly normal Incision/Wound: none  Assessment/Plan Left long finger trigger digit and annular ligament cyst.  Plan left long finger trigger release and excision annular ligament cyst.  Non operative and operative treatment options have been discussed with the patient and patient wishes to proceed with operative treatment. Risks, benefits and alternatives of surgery were discussed including risks of blood loss, infection, damage to nerves/vessels/tendons/ligament/bone, failure of surgery, need for  additional surgery, complication with wound healing, stiffness, recurrence.  She voiced understanding of these risks and elected to proceed.    Betha Loa 11/17/2022, 9:24 AM

## 2022-11-17 NOTE — Discharge Instructions (Addendum)
[facility] Instructions Hand Surgery  Wound Care: Keep your hand elevated above the level of your heart.  Do not allow it to dangle by your side.  Keep the dressing dry and do not remove it unless your doctor advises you to do so.  He will usually change it at the time of your post-op visit.  Moving your fingers is advised to stimulate circulation but will depend on the site of your surgery.  If you have a splint applied, your doctor will advise you regarding movement.  Activity: Do not drive or operate machinery today.  Rest today and then you may return to your normal activity and work as indicated by your physician.  Diet:  Drink liquids today or eat a light diet.  You may resume a regular diet tomorrow.    General expectations: Pain for two to three days. Fingers may become slightly swollen.  Call your doctor if any of the following occur: Severe pain not relieved by pain medication. Elevated temperature. Dressing soaked with blood. Inability to move fingers. White or bluish color to fingers.      Post Anesthesia Home Care Instructions  Activity: Get plenty of rest for the remainder of the day. A responsible individual must stay with you for 24 hours following the procedure.  For the next 24 hours, DO NOT: -Drive a car -Advertising copywriter -Drink alcoholic beverages -Take any medication unless instructed by your physician -Make any legal decisions or sign important papers.  Meals: Start with liquid foods such as gelatin or soup. Progress to regular foods as tolerated. Avoid greasy, spicy, heavy foods. If nausea and/or vomiting occur, drink only clear liquids until the nausea and/or vomiting subsides. Call your physician if vomiting continues.  Special Instructions/Symptoms: Your throat may feel dry or sore from the anesthesia or the breathing tube placed in your throat during surgery. If this causes discomfort, gargle with warm salt water. The discomfort should disappear  within 24 hours.  If you had a scopolamine patch placed behind your ear for the management of post- operative nausea and/or vomiting:  1. The medication in the patch is effective for 72 hours, after which it should be removed.  Wrap patch in a tissue and discard in the trash. Wash hands thoroughly with soap and water. 2. You may remove the patch earlier than 72 hours if you experience unpleasant side effects which may include dry mouth, dizziness or visual disturbances. 3. Avoid touching the patch. Wash your hands with soap and water after contact with the patch.    Regional Anesthesia Blocks  1. You may not be able to move or feel the "blocked" extremity after a regional anesthetic block. This may last may last from 3-48 hours after placement, but it will go away. The length of time depends on the medication injected and your individual response to the medication. As the nerves start to wake up, you may experience tingling as the movement and feeling returns to your extremity. If the numbness and inability to move your extremity has not gone away after 48 hours, please call your surgeon.   2. The extremity that is blocked will need to be protected until the numbness is gone and the strength has returned. Because you cannot feel it, you will need to take extra care to avoid injury. Because it may be weak, you may have difficulty moving it or using it. You may not know what position it is in without looking at it while the block is  in effect.  3. For blocks in the legs and feet, returning to weight bearing and walking needs to be done carefully. You will need to wait until the numbness is entirely gone and the strength has returned. You should be able to move your leg and foot normally before you try and bear weight or walk. You will need someone to be with you when you first try to ensure you do not fall and possibly risk injury.  4. Bruising and tenderness at the needle site are common side effects  and will resolve in a few days.  5. Persistent numbness or new problems with movement should be communicated to the surgeon or the [facility] [phone] [facility] [phone]).   Next dose of Tylenol may be given at 3:15pm if needed.

## 2022-11-17 NOTE — Anesthesia Procedure Notes (Signed)
Anesthesia Regional Block: Supraclavicular block   Pre-Anesthetic Checklist: , timeout performed,  Correct Patient, Correct Site, Correct Laterality,  Correct Procedure, Correct Position, site marked,  Risks and benefits discussed,  Surgical consent,  Pre-op evaluation,  At surgeon's request and post-op pain management  Laterality: Upper and Left  Prep: chloraprep       Needles:  Injection technique: Single-shot  Needle Type: Echogenic Needle     Needle Length: 5cm  Needle Gauge: 21     Additional Needles:   Procedures:,,,, ultrasound used (permanent image in chart),,     Nerve Stimulator or Paresthesia:   Additional Responses:  Block tested.  Patient tolerated procedure well Narrative:  Start time: 11/17/2022 9:26 AM End time: 11/17/2022 9:33 AM Injection made incrementally with aspirations every 5 mL.  Performed by: Personally  Anesthesiologist: Trevor Iha, MD  Additional Notes: Block tested. Patient tolerated procedure well.

## 2022-11-17 NOTE — Anesthesia Procedure Notes (Signed)
Procedure Name: MAC Date/Time: 11/17/2022 9:50 AM  Performed by: Demetrio Lapping, CRNAPre-anesthesia Checklist: Patient identified, Emergency Drugs available, Suction available, Patient being monitored and Timeout performed Patient Re-evaluated:Patient Re-evaluated prior to induction Oxygen Delivery Method: Simple face mask Placement Confirmation: positive ETCO2 Dental Injury: Teeth and Oropharynx as per pre-operative assessment

## 2022-11-17 NOTE — Progress Notes (Signed)
Assisted Dr. Houser with left, supraclavicular, ultrasound guided block. Side rails up, monitors on throughout procedure. See vital signs in flow sheet. Tolerated Procedure well. 

## 2022-11-18 ENCOUNTER — Encounter (HOSPITAL_BASED_OUTPATIENT_CLINIC_OR_DEPARTMENT_OTHER): Payer: Self-pay | Admitting: Orthopedic Surgery

## 2022-11-18 LAB — SURGICAL PATHOLOGY

## 2022-12-22 DIAGNOSIS — M791 Myalgia, unspecified site: Secondary | ICD-10-CM | POA: Diagnosis not present

## 2022-12-22 DIAGNOSIS — G43719 Chronic migraine without aura, intractable, without status migrainosus: Secondary | ICD-10-CM | POA: Diagnosis not present

## 2022-12-22 DIAGNOSIS — G518 Other disorders of facial nerve: Secondary | ICD-10-CM | POA: Diagnosis not present

## 2022-12-22 DIAGNOSIS — M542 Cervicalgia: Secondary | ICD-10-CM | POA: Diagnosis not present

## 2022-12-26 ENCOUNTER — Ambulatory Visit: Payer: Medicare PPO | Admitting: Podiatry

## 2022-12-26 DIAGNOSIS — L603 Nail dystrophy: Secondary | ICD-10-CM

## 2022-12-26 NOTE — Progress Notes (Signed)
Subjective:  Patient ID: Dawn Marsh, female    DOB: 1965/09/12,   MRN: [id number]  Chief Complaint  Patient presents with   Nail Problem    Pt presets for her big toenail growing toward the side and the skin looks damaged the pt stated.    57 y.o. female presents for concern as above. Relates it has been this way for months. Denies any treatments aside from trimming the nails back. Has a history of SLE and Sjogrens . Denies any other pedal complaints. Denies n/v/f/c.   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 CFT <3 seconds. Normal hair growth on digits. No edema.  Skin. No lacerations or abrasions bilateral feet. Left hallux nail distal lateral border with discoloration and thickened to proximal half of nail Musculoskeletal: MMT 5/5 bilateral lower extremities in DF, PF, Inversion and Eversion. Deceased ROM in DF of ankle joint.  Neurological: Sensation intact to light touch.   Assessment:   1. Onychodystrophy      Plan:  Patient was evaluated and treated and all questions answered. -Examined patient -Discussed treatment options for painful dystrophic nails  -Clinical picture and Fungal culture was obtained by removing a portion of the hard nail itself from each of the involved toenails using a  sterile nail nipper and sent to [facility] lab. Patient tolerated the biopsy procedure well without discomfort or need for anesthesia.  -Discussed fungal nail treatment options including oral, topical, and laser treatments.  -Patient to return in 4 weeks for follow up evaluation and discussion of fungal culture results or sooner if symptoms worsen.   Louann Sjogren, DPM

## 2022-12-26 NOTE — Addendum Note (Signed)
Addended by: Daryel November on: 12/26/2022 12:12 PM   Modules accepted: Orders

## 2023-01-12 ENCOUNTER — Ambulatory Visit: Admit: 2023-01-12 | Discharge: 2023-01-13 | Payer: MEDICARE

## 2023-01-12 DIAGNOSIS — Z7969 Long term (current) use of other immunomodulators and immunosuppressants: Secondary | ICD-10-CM | POA: Diagnosis not present

## 2023-01-12 DIAGNOSIS — Z5181 Encounter for therapeutic drug level monitoring: Secondary | ICD-10-CM | POA: Diagnosis not present

## 2023-01-12 DIAGNOSIS — M3219 Other organ or system involvement in systemic lupus erythematosus: Secondary | ICD-10-CM | POA: Diagnosis not present

## 2023-01-12 DIAGNOSIS — Z79620 Long term (current) use of immunosuppressive biologic: Secondary | ICD-10-CM | POA: Diagnosis not present

## 2023-01-12 DIAGNOSIS — M329 Systemic lupus erythematosus, unspecified: Secondary | ICD-10-CM | POA: Diagnosis not present

## 2023-01-12 MED ORDER — HYDROXYCHLOROQUINE 200 MG TABLET
ORAL_TABLET | Freq: Every day | ORAL | 3 refills | 90 days | Status: CP
Start: 2023-01-12 — End: ?

## 2023-01-12 MED ORDER — BENLYSTA 200 MG/ML SUBCUTANEOUS SYRINGE
SUBCUTANEOUS | 3 refills | 0 days | Status: CP
Start: 2023-01-12 — End: 2024-01-12

## 2023-01-23 ENCOUNTER — Encounter: Payer: Self-pay | Admitting: Podiatry

## 2023-01-23 ENCOUNTER — Ambulatory Visit: Payer: Medicare PPO | Admitting: Podiatry

## 2023-01-23 DIAGNOSIS — L603 Nail dystrophy: Secondary | ICD-10-CM | POA: Diagnosis not present

## 2023-01-23 NOTE — Progress Notes (Signed)
  Subjective:  Patient ID: Dawn Marsh, female    DOB: 10-22-1965,   MRN: [id number]  Chief Complaint  Patient presents with   Results    Fungal culture results    57 y.o. female presents for follow-up of culture and to discuss results. . Denies any other pedal complaints. Denies n/v/f/c.   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 CFT <3 seconds. Normal hair growth on digits. No edema.  Skin. No lacerations or abrasions bilateral feet. Left hallux nail distal lateral border with discoloration and thickened to proximal half of nail Musculoskeletal: MMT 5/5 bilateral lower extremities in DF, PF, Inversion and Eversion. Deceased ROM in DF of ankle joint.  Neurological: Sensation intact to light touch.   Assessment:   1. Onychodystrophy       Plan:  Patient was evaluated and treated and all questions answered. -Examined patient -Discussed treatment options for painful dystrophic nails  -Culture negative for fungus  -Discussed nail treatments including urea nail gel and shoe gear modification.  -Patient to return as needed   Louann Sjogren, DPM

## 2023-02-16 DIAGNOSIS — J069 Acute upper respiratory infection, unspecified: Secondary | ICD-10-CM | POA: Diagnosis not present

## 2023-02-24 MED ORDER — BENLYSTA 200 MG/ML SUBCUTANEOUS SYRINGE
SUBCUTANEOUS | 3 refills | 0.00 days
Start: 2023-02-24 — End: ?

## 2023-02-28 DIAGNOSIS — M25642 Stiffness of left hand, not elsewhere classified: Secondary | ICD-10-CM | POA: Diagnosis not present

## 2023-02-28 DIAGNOSIS — M79645 Pain in left finger(s): Secondary | ICD-10-CM | POA: Diagnosis not present

## 2023-05-22 DIAGNOSIS — Z6825 Body mass index (BMI) 25.0-25.9, adult: Secondary | ICD-10-CM | POA: Diagnosis not present

## 2023-05-22 DIAGNOSIS — E663 Overweight: Secondary | ICD-10-CM | POA: Diagnosis not present

## 2023-05-22 DIAGNOSIS — H9202 Otalgia, left ear: Secondary | ICD-10-CM | POA: Diagnosis not present

## 2023-05-22 DIAGNOSIS — B349 Viral infection, unspecified: Secondary | ICD-10-CM | POA: Diagnosis not present

## 2023-05-22 DIAGNOSIS — Z20828 Contact with and (suspected) exposure to other viral communicable diseases: Secondary | ICD-10-CM | POA: Diagnosis not present

## 2023-05-22 DIAGNOSIS — R6889 Other general symptoms and signs: Secondary | ICD-10-CM | POA: Diagnosis not present

## 2023-05-25 ENCOUNTER — Other Ambulatory Visit: Payer: Self-pay | Admitting: Interventional Radiology

## 2023-05-25 DIAGNOSIS — D1771 Benign lipomatous neoplasm of kidney: Secondary | ICD-10-CM

## 2023-07-05 DIAGNOSIS — Z6826 Body mass index (BMI) 26.0-26.9, adult: Secondary | ICD-10-CM | POA: Diagnosis not present

## 2023-07-05 DIAGNOSIS — Z1231 Encounter for screening mammogram for malignant neoplasm of breast: Secondary | ICD-10-CM | POA: Diagnosis not present

## 2023-07-05 DIAGNOSIS — Z01419 Encounter for gynecological examination (general) (routine) without abnormal findings: Secondary | ICD-10-CM | POA: Diagnosis not present

## 2023-07-06 DIAGNOSIS — Z79899 Other long term (current) drug therapy: Secondary | ICD-10-CM | POA: Diagnosis not present

## 2023-07-06 DIAGNOSIS — H40013 Open angle with borderline findings, low risk, bilateral: Secondary | ICD-10-CM | POA: Diagnosis not present

## 2023-07-06 DIAGNOSIS — H2513 Age-related nuclear cataract, bilateral: Secondary | ICD-10-CM | POA: Diagnosis not present

## 2023-07-06 DIAGNOSIS — M3210 Systemic lupus erythematosus, organ or system involvement unspecified: Secondary | ICD-10-CM | POA: Diagnosis not present

## 2023-07-06 DIAGNOSIS — H35361 Drusen (degenerative) of macula, right eye: Secondary | ICD-10-CM | POA: Diagnosis not present

## 2023-09-08 DIAGNOSIS — Z20828 Contact with and (suspected) exposure to other viral communicable diseases: Secondary | ICD-10-CM | POA: Diagnosis not present

## 2023-09-08 DIAGNOSIS — Z0000 Encounter for general adult medical examination without abnormal findings: Secondary | ICD-10-CM | POA: Diagnosis not present

## 2023-09-08 DIAGNOSIS — M329 Systemic lupus erythematosus, unspecified: Secondary | ICD-10-CM | POA: Diagnosis not present

## 2023-09-08 DIAGNOSIS — Z6826 Body mass index (BMI) 26.0-26.9, adult: Secondary | ICD-10-CM | POA: Diagnosis not present

## 2023-09-08 DIAGNOSIS — F419 Anxiety disorder, unspecified: Secondary | ICD-10-CM | POA: Diagnosis not present

## 2023-09-08 DIAGNOSIS — M797 Fibromyalgia: Secondary | ICD-10-CM | POA: Diagnosis not present

## 2023-09-08 DIAGNOSIS — E559 Vitamin D deficiency, unspecified: Secondary | ICD-10-CM | POA: Diagnosis not present

## 2023-09-08 DIAGNOSIS — E782 Mixed hyperlipidemia: Secondary | ICD-10-CM | POA: Diagnosis not present

## 2023-09-08 DIAGNOSIS — Z0001 Encounter for general adult medical examination with abnormal findings: Secondary | ICD-10-CM | POA: Diagnosis not present

## 2023-09-08 DIAGNOSIS — G43909 Migraine, unspecified, not intractable, without status migrainosus: Secondary | ICD-10-CM | POA: Diagnosis not present

## 2023-09-08 DIAGNOSIS — D550 Anemia due to glucose-6-phosphate dehydrogenase [G6PD] deficiency: Secondary | ICD-10-CM | POA: Diagnosis not present

## 2023-09-08 DIAGNOSIS — I10 Essential (primary) hypertension: Secondary | ICD-10-CM | POA: Diagnosis not present

## 2023-09-08 DIAGNOSIS — E7849 Other hyperlipidemia: Secondary | ICD-10-CM | POA: Diagnosis not present

## 2023-09-11 ENCOUNTER — Encounter (INDEPENDENT_AMBULATORY_CARE_PROVIDER_SITE_OTHER): Payer: Self-pay

## 2023-10-02 DIAGNOSIS — M797 Fibromyalgia: Secondary | ICD-10-CM | POA: Diagnosis not present

## 2023-12-04 ENCOUNTER — Other Ambulatory Visit: Payer: Self-pay | Admitting: Interventional Radiology

## 2023-12-04 DIAGNOSIS — D1771 Benign lipomatous neoplasm of kidney: Secondary | ICD-10-CM

## 2023-12-05 ENCOUNTER — Ambulatory Visit (INDEPENDENT_AMBULATORY_CARE_PROVIDER_SITE_OTHER): Admitting: Otolaryngology

## 2023-12-05 ENCOUNTER — Encounter (INDEPENDENT_AMBULATORY_CARE_PROVIDER_SITE_OTHER): Payer: Self-pay | Admitting: Otolaryngology

## 2023-12-05 VITALS — BP 138/93 | HR 91 | Ht 65.0 in | Wt 165.0 lb

## 2023-12-05 DIAGNOSIS — R1319 Other dysphagia: Secondary | ICD-10-CM | POA: Diagnosis not present

## 2023-12-05 DIAGNOSIS — R09A2 Foreign body sensation, throat: Secondary | ICD-10-CM

## 2023-12-05 DIAGNOSIS — R0981 Nasal congestion: Secondary | ICD-10-CM

## 2023-12-05 DIAGNOSIS — J3489 Other specified disorders of nose and nasal sinuses: Secondary | ICD-10-CM | POA: Diagnosis not present

## 2023-12-05 DIAGNOSIS — M3500 Sicca syndrome, unspecified: Secondary | ICD-10-CM | POA: Diagnosis not present

## 2023-12-05 DIAGNOSIS — J343 Hypertrophy of nasal turbinates: Secondary | ICD-10-CM

## 2023-12-05 MED ORDER — MUPIROCIN 2 % EX OINT: 1.0000 | TOPICAL_OINTMENT | Freq: Two times a day (BID) | CUTANEOUS | 0 refills | Status: AC

## 2023-12-05 NOTE — Patient Instructions (Addendum)
 Use reflux gourmet -- use after lunch and dinner (buy on amazon) Mupirocin  ointment: Apply a pea sized amount twice daily just to inside of each nostril using your pinkie finger for 7 days (apply to the nostril part, not the middle part), then pinch your nose for 10 seconds, then stop Ayr gel: Apply a pea sized amount up to 4 times daily just to inside of each nostril like above, then pinch your nose for 10 seconds. Use this consistently.     You can also run a humidifier at night and use the [facility] Nasal Saline Rinse  - start nasal saline rinses with NeilMed Bottle available over the counter    Nasal Saline Irrigation instructions: If you choose to make your own salt water solution, You will need: Salt (kosher, canning, or pickling salt) Baking soda Nasal irrigation bottle (i.e. [facility] Med Sinus Rinse) Measuring spoon ( teaspoon) Distilled / boiled water   Mix solution Mix 1 teaspoon of salt, 1/2 teaspoon of baking soda and 1 cup of water into irrigation bottle ** May use saline packet instead of homemade recipe for this step if you prefer If medicine was prescribed to be mixed with solution, place this into bottle Examples 2 inches of 2% mupirocin  ointment Budesonide solution Position your head: Lean over sink (about 45 degrees) Rotate head (about 45 degrees) so that one nostril is above the other Irrigate Insert tip of irrigation bottle into upper nostril so it forms a comfortable seal Irrigate while breathing through your mouth May remove the straw from the bottle in order to irrigate the entire solution (important if medicine was added) Exhale through nose when finished and blow nose as necessary  Repeat on opposite side with other 1/2 of solution (120 mL) or remake solution if all 240 mL was used on first side Wash irrigation bottle regularly, replace every 3 months

## 2023-12-05 NOTE — Progress Notes (Signed)
 Dear Dr. Marvine, Here is my assessment for our mutual patient, Dawn Marsh. Thank you for allowing me the opportunity to care for your patient. Please do not hesitate to contact me should you have any other questions. Sincerely, Dr. Eldora Blanch  Otolaryngology Clinic Note  HISTORY: Dawn Marsh is a 58 y.o. female kindly referred by Dr. Marvine for evaluation of nasal lesion (bilateral)  Initial visit (11/2023): She reports that she has had bilateral nasal lesion (?) which her primary care doctor noticed, which has been off and off for several years, but now noted in June. She is not able to see or feel it with her fingers. Sometimes, when she pulls it out or blows it out, it looks like a scab or dried crusting with sometimes foul smell. Mild soreness. No history of frequent sinusitis. Right now, she reports that she is doing well. She does not use any nasal medications. No facial pain/pressure, discolored drainage. Sense of smell is generally well. Nose does feel dry. No epistaxis. Not using any nasal medications. No recent CTs  In addition, she also has some globus sensation with food getting stuck in her throat and left side of throat feels swollen and some dysphagia (meats especially and pills). No prior swallow evaluation Patient otherwise denies: - odynophagia, aspiration episodes or PNA, need for Heimlich, unintentional weight loss - changes in voice, shortness of breath, hemoptysis - new neck masses  GLP-1: no AP/AC: no  Tobacco: no  PMHx: Migraine, SLE (+ANA, _dsDNA, +Ro, +RNP), HLD, HTN, GAD, Fibromyalgia, Sjogren's  RADIOGRAPHIC EVALUATION AND INDEPENDENT REVIEW OF OTHER RECORDS:: Dr. Marvine (Referral notes reviewed and uploaded or available in chart in media tab) 09/08/2023 - noted neck swelling and nasal blockage, with ear and jaw pain x3 days (March) 01/12/2023 Rheumatology notes: noted history of persistently enlarged lymphadenopathy, PET/CT with hypermetabolic  LAD in neck, chest, pelvis, and had bx in 2016 by ENT of L neck node - reactive hyperplasia; on Plaquenil and benlysta  Labs 01/12/2023: WBC 4.6, Bun/Cr 14/0.84, Alk Phos 136, C3/DsDNA - neg/wnl Dr. 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   Left renal mass    Leukopenia 12/27/2012   Lupus    Migraine    Nontoxic simple goitre 12/27/2012   PONV (postoperative nausea and vomiting)    Rhinitis, allergic    seasonal   Seizures (HCC)    migraines   SLE (systemic lupus erythematosus) (HCC) 12/27/2012   Vertigo    Past Surgical History:  Procedure Laterality Date   ABDOMINAL HYSTERECTOMY     ABDOMINAL HYSTERECTOMY  2011   CYST EXCISION Left 11/17/2022   Procedure: EXCISION RETINACULAR CYST LEFT MIDDLE FINGER;  Surgeon: Murrell Drivers, MD;  Location:  SUR[facility]ERY C[facility];  Service: Orthopedics;  Laterality: Left;   IR RADIOLOGIST EVAL & MGMT  01/12/2017   IR RADIOLOGIST EVAL & MGMT  02/07/2017   IR RADIOLOGIST EVAL & MGMT  12/07/2017   IR RADIOLOGIST EVAL &  MGMT  12/26/2018   IR RADIOLOGIST EVAL & MGMT  06/25/2019   IR RADIOLOGIST EVAL & MGMT  08/25/2020   IR RADIOLOGIST EVAL & MGMT  09/10/2021   IR RENAL SELECTIVE  UNI INC S&I MOD SED  11/12/2020   IR US  GUIDE VASC ACCESS RIGHT  11/12/2020   KIDNEY SURGERY     MYOMECTOMY ABDOMINAL APPROACH     myomectomy x 2 (prior to the hysterectomy)  2002 and 2006   TRIGGER FINGER RELEASE Left 11/17/2022   Procedure:  RELEASE TRIGGER FINGER/A-1 PULLEY LEFT MIDDLE FINGER;  Surgeon: Murrell Drivers, MD;  Location: [facility];  Service: Orthopedics;  Laterality: Left;  30 MIN   TUMOR REMOVAL     History reviewed. No pertinent family history. Social History   Tobacco Use   Smoking status: Never   Smokeless tobacco: Never  Substance Use Topics   Alcohol use: Not Currently    Comment: occas-maybe once a month   Allergies  Allergen Reactions   Amoxicillin Hives and Diarrhea   Amoxicillin-Pot Clavulanate     Other Reaction(s): Unknown   Amoxil [Amoxicillin Trihydrate] Hives and Diarrhea   Penicillins Hives and Diarrhea   Latex Rash   Current Outpatient Medications  Medication Sig Dispense Refill   acetaminophen -codeine  (TYLENOL  #3) 300-30 MG tablet Take 1 tablet by mouth every 6 (six) hours as needed for moderate pain. 15 tablet 0   Belimumab  (BENLYSTA ) 200 MG/ML SOAJ Inject 200 mg into the skin every Friday.     dexamethasone  (DECADRON ) 10 MG/ML injection Inject 10 mg into the vein every 8 (eight) weeks.     DULoxetine (CYMBALTA) 30 MG capsule Take 30 mg by mouth 2 (two) times daily.     hydroxychloroquine (PLAQUENIL) 200 MG tablet Take 200 mg by mouth daily.     mupirocin  ointment (BACTROBAN ) 2 % Apply 1 Application topically 2 (two) times daily for 7 days. 22 g 0   naproxen sodium (ALEVE) 220 MG tablet Take 220 mg by mouth daily as needed (pain).     rosuvastatin (CRESTOR) 10 MG tablet Take by mouth.     triamcinolone ointment (KENALOG) 0.1 % Apply 1 application topically 2 (two) times daily as needed (rash).     No current facility-administered medications for this visit.   BP (!) 138/93 (BP Location: Left Arm, Patient Position: Sitting, Cuff Size: Large)   Pulse 91   Ht 5' 5 (1.651 m)   Wt 165 lb (74.8 kg)   SpO2 95%   BMI 27.46 kg/m   PHYSICAL EXAM:  BP (!) 138/93 (BP Location: Left Arm, Patient Position: Sitting, Cuff Size: Large)   Pulse 91   Ht 5' 5 (1.651 m)   Wt 165  lb (74.8 kg)   SpO2 95%   BMI 27.46 kg/m    Salient findings:  CN II-XII intact Bilateral EAC clear and TM intact with well pneumatized middle ear spaces Nose: Anterior rhinoscopy reveals fairly dry nasal cavity but without significant crusting; no lesions appreciated.  Nasal endoscopy was indicated to better evaluate the nose and paranasal sinuses, given the patient's history and exam findings, and is detailed below. No lesions of oral cavity/oropharynx; oral cavity fairly dry No obviously palpable neck masses/lymphadenopathy/thyromegaly No respiratory distress or stridor; easily tolerates secretions; TFL was indicated to better evaluate the proximal airway, given the patient's history and exam findings, and is detailed below.   PROCEDURE:  Prior to initiating any procedures, risks/benefits/alternatives were explained to the patient and verbal consent obtained.  Diagnostic Nasal Endoscopy Pre-procedure diagnosis: Nasal congestion, concern for intranasal lesion Post-procedure diagnosis: same Indication: See pre-procedure diagnosis and physical exam above Complications: None apparent EBL: 0 mL Anesthesia: Lidocaine  4% and topical decongestant was topically sprayed in each nasal cavity  Description of Procedure:  Patient was identified. A rigid 30 degree endoscope was utilized to evaluate the sinonasal cavities, mucosa, sinus ostia and turbinates and septum.  Overall, signs of mucosal inflammation are not noted. No nasal lesions appreciated, otherwise exam as above.  No mucopurulence, polyps, or masses noted.   Right Middle meatus: clear Right SE Recess: clear Left MM: clear Left SE Recess: clear Photodocumentation was obtained.  CPT CODE -- 68768 - Mod 25  Procedure Note Pre-procedure diagnosis:  Dysphagia, globus sensation Post-procedure diagnosis: Same Procedure: Transnasal Fiberoptic Laryngoscopy, CPT 31575 - Mod 25 Indication: see above Complications: None apparent EBL: 0  mL  The procedure was undertaken to further evaluate the patient's complaint above, with mirror exam inadequate for appropriate examination due to gag reflex and poor patient tolerance  Procedure:  Patient was identified as correct patient. Verbal consent was obtained. The nose was sprayed with oxymetazoline  and 4% lidocaine . The The flexible laryngoscope was passed through the nose to view the nasal cavity, pharynx (oropharynx, hypopharynx) and larynx.  The larynx was examined at rest and during multiple phonatory tasks. Documentation was obtained and reviewed with patient. The scope was removed. The patient tolerated the procedure well.  Findings: The nasal cavity and nasopharynx did not reveal any masses or lesions, mucosa appeared to be without obvious lesions. The tongue base, pharyngeal walls, piriform sinuses, vallecula, epiglottis and postcricoid region are normal in appearance. The visualized portion of the subglottis and proximal trachea is widely patent. The vocal folds are mobile bilaterally. There are no lesions on the free edge of the vocal folds nor elsewhere in the larynx worrisome for malignancy.    Electronically signed by: Eldora KATHEE Blanch, MD 12/05/2023 9:52 AM    ASSESSMENT:  58 y.o. with SLE and Sjogren's(?) with:  1. Other dysphagia   2. Globus sensation   3. Nasal dryness   4. Hypertrophy of both inferior nasal turbinates   5. Nasal congestion   6. Sjogren's syndrome, with unspecified organ involvement    Multiple issues - suspect her nasal lesions are due to dry crusting. Endo reassuring. Does have fair amount of dryness Dysphagia and globus: only with solid foods and pills; wonder if this is also related to dryness but given her SLE, do think MBS is warranted. Discussed need for improved hydration  We've discussed issues and options today.  The risks, benefits and alternatives were discussed and questions answered.  She has elected to proceed with:  1) Mupirocin   ointment BID x7d, then saline gel 2) Daily nasal rinses 3) MBS; Start Reflux gourmet after meals Follow-up in 6-8 weeks -- sooner as necessary.  See below regarding exact medications prescribed this encounter including dosages and route: Meds ordered this encounter  Medications   mupirocin  ointment (BACTROBAN ) 2 %    Sig: Apply 1 Application topically 2 (two) times daily for 7 days.    Dispense:  22 g    Refill:  0     Thank you for allowing me the opportunity to care for your patient. Please do not hesitate to contact me should you have any other questions.  Sincerely, Eldora Blanch, MD Otolaryngologist (ENT), [facility] Phone: [phone] Fax: [phone]  MDM:  Level 4: [id number] Complexity/Problems addressed: mod -  chronic problems Data complexity: mod -  independent review of notes, labs, ordering test - Morbidity: mod  - Prescription Drug prescribed or managed: y  12/05/2023, 9:52 AM

## 2023-12-11 ENCOUNTER — Ambulatory Visit
Admission: RE | Admit: 2023-12-11 | Discharge: 2023-12-11 | Disposition: A | Discharge status: Home / Self Care | Source: Ambulatory Visit | Attending: Interventional Radiology | Admitting: Interventional Radiology

## 2023-12-11 DIAGNOSIS — D1771 Benign lipomatous neoplasm of kidney: Secondary | ICD-10-CM

## 2023-12-11 MED ORDER — IOPAMIDOL (ISOVUE-370) INJECTION 76%
80.0000 mL | Freq: Once | INTRAVENOUS | Status: AC | PRN
  Administered 2023-12-11: 80 mL via INTRAVENOUS

## 2023-12-20 ENCOUNTER — Ambulatory Visit
Admission: RE | Admit: 2023-12-20 | Discharge: 2023-12-20 | Disposition: A | Source: Ambulatory Visit | Attending: Interventional Radiology | Admitting: Interventional Radiology

## 2023-12-20 DIAGNOSIS — D1771 Benign lipomatous neoplasm of kidney: Secondary | ICD-10-CM

## 2023-12-20 HISTORY — PX: IR RADIOLOGIST EVAL & MGMT: IMG5224

## 2023-12-20 NOTE — Progress Notes (Signed)
 Patient ID: Dawn Marsh, female   DOB: 08/25/1965, 58 y.o.   MRN: [id number]       Chief Complaint:   Left renal angiomyolipoma, outpatient surveillance   Referring Physician(s): Dr. Sherrilee  History of Present Illness: Dawn Marsh is a 58 y.o. female who is well-known to our service.  She is closely followed for a large left renal angiomyolipoma.  Lesion is exophytic off of the left kidney lower pole.  She had a remote left renal ablation for a benign lesion in 2013 which was a spindle cell neoplasm pathologically.  She has a chronic history of SLE and leukopenia as well.  She is followed by rheumatology.  Overall she continues to do very well with an excellent functional status.  She remains asymptomatic.  No urinary tract or renal symptoms.  No acute flank pain or abdominal pain.  No signs of recent illness or fever.  No dysuria or hematuria.  She had a surveillance CTA abdomen 12/11/2023.  This demonstrates continued slow enlargement of the left kidney lower pole angiomyolipoma now measuring up to 6.8 cm, previously 5.7 cm.  No signs of interval acute hemorrhage or hematoma.  CTA does demonstrate parasitized vasculature likely from the IMA along the inferior margin.  No additional acute abnormality in the abdomen.  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   Left renal mass    Leukopenia 12/27/2012   Lupus    Migraine    Nontoxic simple goitre 12/27/2012   PONV (postoperative nausea and vomiting)    Rhinitis, allergic    seasonal   Seizures (HCC)    migraines   SLE (systemic lupus erythematosus) (HCC) 12/27/2012   Vertigo     Past Surgical  History:  Procedure Laterality Date   ABDOMINAL HYSTERECTOMY     ABDOMINAL HYSTERECTOMY  2011   CYST EXCISION Left 11/17/2022   Procedure: EXCISION RETINACULAR CYST LEFT MIDDLE FINGER;  Surgeon: Murrell Drivers, MD;  Location: [facility];  Service: Orthopedics;  Laterality: Left;   IR RADIOLOGIST EVAL & MGMT  01/12/2017   IR RADIOLOGIST EVAL & MGMT  02/07/2017   IR RADIOLOGIST EVAL & MGMT  12/07/2017   IR RADIOLOGIST EVAL & MGMT  12/26/2018   IR RADIOLOGIST EVAL & MGMT  06/25/2019   IR RADIOLOGIST EVAL & MGMT  08/25/2020   IR RADIOLOGIST EVAL & MGMT  09/10/2021   IR RENAL SELECTIVE  UNI INC S&I MOD SED  11/12/2020   IR US  GUIDE VASC ACCESS RIGHT  11/12/2020   KIDNEY SURGERY     MYOMECTOMY ABDOMINAL APPROACH     myomectomy x 2 (prior to the hysterectomy)  2002 and 2006   TRIGGER FINGER RELEASE Left 11/17/2022   Procedure: RELEASE TRIGGER FINGER/A-1 PULLEY LEFT MIDDLE FINGER;  Surgeon: Murrell Drivers, MD;  Location: [facility];  Service: Orthopedics;  Laterality: Left;  30 MIN   TUMOR REMOVAL      Allergies: Amoxicillin, Amoxicillin-pot clavulanate, Amoxil [amoxicillin trihydrate], Penicillins, and Latex  Medications: Prior to Admission medications   Medication Sig Start Date End Date Taking? Authorizing Provider  acetaminophen -codeine  (TYLENOL  #3) 300-30 MG  tablet Take 1 tablet by mouth every 6 (six) hours as needed for moderate pain. 11/17/22   Kuzma, Kevin, MD  Belimumab  (BENLYSTA ) 200 MG/ML SOAJ Inject 200 mg into the skin every Friday.    [provider]  dexamethasone  (DECADRON ) 10 MG/ML injection Inject 10 mg into the vein every 8 (eight) weeks.    [provider]  DULoxetine (CYMBALTA) 30 MG capsule Take 30 mg by mouth 2 (two) times daily.    [provider]  hydroxychloroquine (PLAQUENIL) 200 MG tablet Take 200 mg by mouth daily.    [provider]  naproxen sodium (ALEVE) 220 MG tablet Take 220 mg by mouth daily as needed (pain).     [provider]  rosuvastatin (CRESTOR) 10 MG tablet Take by mouth. 11/02/22 12/05/23  [provider]  triamcinolone ointment (KENALOG) 0.1 % Apply 1 application topically 2 (two) times daily as needed (rash).    [provider]     No family history on file.  Social History   Socioeconomic History   Marital status: Divorced    Spouse name: Not on file   Number of children: Not on file   Years of education: Not on file   Highest education level: Not on file  Occupational History   Not on file  Tobacco Use   Smoking status: Never   Smokeless tobacco: Never  Substance and Sexual Activity   Alcohol use: Not Currently    Comment: occas-maybe once a month   Drug use: No   Sexual activity: Not on file    Comment: Hyst  Other Topics Concern   Not on file  Social History Narrative   ** Merged History Encounter **       Social Drivers of Health   Financial Resource Strain: Patient Declined (10/28/2022)   Received from [facility]   Overall Financial Resource Strain (CARDIA)    Difficulty of Paying Living Expenses: Patient declined  Food Insecurity: Low Risk  (02/08/2023)   Received from [facility]   Hunger Vital Sign    Within the past 12 months, you worried that your food would run out before you got money to buy more: Never true    Within the past 12 months, the food you bought just didn't last and you didn't have money to get more. : Never true  Transportation Needs: No Transportation Needs (02/08/2023)   Received from [facility]    In the past 12 months, has lack of reliable transportation kept you from medical appointments, meetings, work or from getting things needed for daily living? : No  Physical Activity: Not on file  Stress: Not on file  Social Connections: Not on file       Review of Systems  Review of Systems: A 12 point ROS discussed and pertinent positives are indicated in the HPI above.  All other  systems are negative.    Physical Exam No direct physical exam was performed   Telehealth visit only today to review surveillance imaging.  Vital Signs: There were no vitals taken for this visit.  Imaging: CT ANGIO ABDOMEN W &/OR WO CONTRAST Result Date: 12/15/2023 CLINICAL DATA:  Surveillance of known left lower pole renal angiomyolipoma. History of additional ablation of benign left renal lesion in 2013. EXAM: CT ANGIOGRAPHY ABDOMEN TECHNIQUE: Multidetector CT imaging of the abdomen was performed using the standard protocol during bolus administration of intravenous contrast. Multiplanar reconstructed images and MIPs were obtained and reviewed to  evaluate the vascular anatomy. RADIATION DOSE REDUCTION: This exam was performed according to the departmental dose-optimization program which includes automated exposure control, adjustment of the mA and/or kV according to patient size and/or use of iterative reconstruction technique. CONTRAST:  80mL ISOVUE -370 IOPAMIDOL  (ISOVUE -370) INJECTION 76% COMPARISON:  CT abdomen 04/14/2022 and additional prior studies FINDINGS: VASCULAR Aorta: Normally patent abdominal aorta demonstrating no atherosclerosis and normal caliber. Celiac: Normally patent with normally patent branch vessels and branching anatomy. SMA: Normally patent. Renals: Normally patent bilateral single renal arteries. IMA: Normally patent. Inflow: Normally patent bilateral common iliac arteries and common iliac bifurcations. Veins: Venous phase imaging demonstrates normally patent venous structures throughout the abdomen. Review of the MIP images confirms the above findings. NON-VASCULAR Lower chest: No acute abnormality. Hepatobiliary: No focal liver abnormality is seen. No gallstones, gallbladder wall thickening, or biliary dilatation. Pancreas: Unremarkable. No pancreatic ductal dilatation or surrounding inflammatory changes. Spleen: Normal in size without focal abnormality. Adrenals/Urinary  Tract: Adrenal glands are unremarkable. Right kidney remains normal. Bladder is unremarkable. There is interval growth of a known exophytic left lower pole renal angiomyolipoma now measuring approximately 6.8 x 5.4 x 5.1 cm (5.7 x 4.5 x 4.2 cm previously). The lesion demonstrates predominantly internal fat density with internal solid enhancing components and internal vascularity that increases between arterial and venous phases with prominent venous drainage visible on the venous phase. Deep enhancing component of the lesion appears to be supplied by lower pole cortical branches of the kidney. Based on arterial phase CT imaging, the periphery of the lesion may be parasitizing supply from branches of the inferior mesenteric artery. No evidence of acute hemorrhage or arterial pseudoaneurysm. Stomach/Bowel: Bowel shows no evidence of obstruction, ileus, inflammation or lesion. No free intraperitoneal air. Lymphatic: No enlarged lymph nodes identified. Other: No abdominal wall hernia or abnormality. No ascites. Musculoskeletal: No acute or significant osseous findings. IMPRESSION: Interval growth of a known exophytic left lower pole renal angiomyolipoma now measuring 6.8 x 5.4 x 5.1 cm (5.7 x 4.5 x 4.2 cm previously). The lesion demonstrates predominantly internal fat density with internal solid enhancing components and internal vascularity that increases between arterial and venous phases with prominent venous drainage visible on the venous phase. Deep enhancing component of the lesion appears to be supplied by lower pole cortical branches of the kidney. Based on arterial phase CT imaging, the periphery of the lesion may be parasitizing supply from branches of the inferior mesenteric artery. No evidence of acute hemorrhage or arterial pseudoaneurysm. Electronically Signed   By: Marcey Moan M.D.   On: 12/15/2023 09:20    Labs:  CBC: No results for input(s): WBC, HGB, HCT, PLT in the last 8760  hours.  COAGS: No results for input(s): INR, APTT in the last 8760 hours.  BMP: No results for input(s): NA, K, CL, CO2, GLUCOSE, BUN, CALCIUM, CREATININE, GFRNONAA, GFRAA in the last 8760 hours.  Invalid input(s): CMP  LIVER FUNCTION TESTS: No results for input(s): BILITOT, AST, ALT, ALKPHOS, PROT, ALBUMIN in the last 8760 hours.     Assessment and Plan:  Continued interval enlargement of the exophytic left lower pole renal angiomyolipoma measuring up to 6.8 cm, previously 5.7 cm.  Recent CTA 12/11/2023 demonstrates parasitized vascular supply likely from the IMA branches.  She did have a previous unsuccessful attempt at angioembolization of the left renal artery vasculature on 11/12/2020.  We discussed repeat angioembolization targeting the mesenteric vasculature since the lesion has continued to enlarge.  After our discussion she is in agreement  with this plan and would like to electively schedule this in the next 3 months by the end of the year.  All questions addressed.  She has a clear understanding of the procedure.     Plan: Scheduled for repeat angioembolization of the slowly enlarging left renal angiomyolipoma.   Thank you for this interesting consult.  I greatly enjoyed meeting CHESTER ROMERO and look forward to participating in their care.  A copy of this report was sent to the requesting provider on this date.  Electronically Signed: Ozell Specking 12/20/2023, 4:04 PM   I spent a total of    25 Minutes in remote  clinical consultation, greater than 50% of which was counseling/coordinating care for This patient was a slowly enlarging left renal angiomyolipoma.    Visit type: Audio only (telephone). Audio (no video) only due to patient's lack of internet/smartphone capability. Alternative for in-person consultation at [facility], [address], [place], [place].  This format is felt to be most appropriate for this  patient at this time.  All issues noted in this document were discussed and addressed.

## 2023-12-29 DIAGNOSIS — M3219 Other organ or system involvement in systemic lupus erythematosus: Principal | ICD-10-CM

## 2024-01-18 ENCOUNTER — Other Ambulatory Visit (HOSPITAL_COMMUNITY): Payer: Self-pay | Admitting: Interventional Radiology

## 2024-01-18 ENCOUNTER — Telehealth (HOSPITAL_COMMUNITY): Payer: Self-pay | Admitting: Radiology

## 2024-01-18 DIAGNOSIS — D1771 Benign lipomatous neoplasm of kidney: Secondary | ICD-10-CM

## 2024-01-18 NOTE — Telephone Encounter (Signed)
 Called pt to schedule left renal angio with Dr. Vanice on 03/01/24. Left VM for her to call me back. JM

## 2024-01-31 DIAGNOSIS — M329 Systemic lupus erythematosus, unspecified: Principal | ICD-10-CM

## 2024-02-06 ENCOUNTER — Ambulatory Visit (INDEPENDENT_AMBULATORY_CARE_PROVIDER_SITE_OTHER): Admitting: Otolaryngology

## 2024-02-15 DIAGNOSIS — M329 Systemic lupus erythematosus, unspecified: Principal | ICD-10-CM

## 2024-02-15 MED ORDER — BELIMUMAB 200 MG/ML SUBCUTANEOUS AUTO-INJECTOR
SUBCUTANEOUS | 3 refills | 0.00000 days | Status: CP
Start: 2024-02-15 — End: 2024-02-15

## 2024-02-27 ENCOUNTER — Ambulatory Visit
Admit: 2024-02-27 | Discharge: 2024-02-28 | Payer: Medicare (Managed Care) | Attending: Rheumatology | Primary: Rheumatology

## 2024-02-27 ENCOUNTER — Ambulatory Visit (INDEPENDENT_AMBULATORY_CARE_PROVIDER_SITE_OTHER): Admitting: Otolaryngology

## 2024-02-27 DIAGNOSIS — M329 Systemic lupus erythematosus, unspecified: Principal | ICD-10-CM

## 2024-02-27 MED ORDER — HYDROXYCHLOROQUINE 200 MG TABLET
ORAL_TABLET | Freq: Every day | ORAL | 3 refills | 90.00000 days | Status: CP
Start: 2024-02-27 — End: ?

## 2024-02-27 NOTE — H&P (Signed)
 Chief Complaint: Patient was seen in consultation today for left renal angiomyolipoma  at the request of Dr. Sherrilee   Referring Physician(s): Dr. Sherrilee   Supervising Physician: Vanice Revel  Patient Status: [facility] - Out-pt  History of Present Illness: Dawn Marsh is a 58 y.o. 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   Patient has been followed by urology for her left renal angiomyolipoma, she was been followed by Dr. Vanice since 2018 and underwent angiogram with attempted  embolization of the left renal angiomyolipoma on 11/12/20. Patient has been under surveillance with annual  CT abdomen with and without, CT abd w/wo on 04/14/22 showed slight interval increase in size in the 5.7 cm left lower pole renal angiomyolipoma, previously 5.4 cm. Patient had f/u visit with Dr. Vanice on 04/18/22 and decision was made to continue surveillance, CTA abd around August 2024 was recommended to the patient which was not obtained.   Patient ultimately underwent CTA Abdomen on 12/11/23 which showed Interval growth of a known exophytic left lower pole renal angiomyolipoma now measuring 6.8 x 5.4 x 5.1 cm (5.7 x 4.5 x 4.2 cm previously.) CTA also demonstrated parasitized vascular supply likely from the IMA branches. She had follow up visit with Dr. Vanice on 12/20/23 when repeat angioembolization targeting the mesenteric vasculature was recommended to the patient since the lesion has continued to enlarge. After thorough discussion and shared decision making patient decided to proceed. Patient presents to [facility] IR for the procedure.   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   Left renal mass    Leukopenia 12/27/2012   Lupus    Migraine    Nontoxic simple goitre 12/27/2012   PONV (postoperative nausea and vomiting)    Rhinitis, allergic    seasonal   Seizures (HCC)    migraines   SLE (systemic lupus erythematosus) (HCC) 12/27/2012   Vertigo     Past Surgical History:  Procedure Laterality Date   ABDOMINAL HYSTERECTOMY     ABDOMINAL HYSTERECTOMY  2011   CYST EXCISION Left 11/17/2022   Procedure: EXCISION RETINACULAR CYST LEFT MIDDLE FINGER;  Surgeon: Murrell Drivers, MD;  Location: [facility];  Service: Orthopedics;  Laterality: Left;   IR RADIOLOGIST EVAL & MGMT  01/12/2017   IR RADIOLOGIST EVAL & MGMT  02/07/2017   IR RADIOLOGIST EVAL & MGMT  12/07/2017   IR RADIOLOGIST EVAL & MGMT  12/26/2018   IR RADIOLOGIST EVAL & MGMT  06/25/2019   IR RADIOLOGIST EVAL & MGMT  08/25/2020   IR RADIOLOGIST EVAL & MGMT  09/10/2021   IR RADIOLOGIST EVAL & MGMT  12/20/2023   IR RENAL SELECTIVE  UNI INC S&I MOD SED  11/12/2020   IR US  GUIDE VASC ACCESS RIGHT  11/12/2020   KIDNEY SURGERY     MYOMECTOMY ABDOMINAL APPROACH     myomectomy x 2 (prior to the hysterectomy)  2002 and 2006   TRIGGER  FINGER RELEASE Left 11/17/2022   Procedure: RELEASE TRIGGER FINGER/A-1 PULLEY LEFT MIDDLE FINGER;  Surgeon: Murrell Drivers, MD;  Location: [facility];  Service: Orthopedics;  Laterality: Left;  30 MIN   TUMOR REMOVAL      Allergies: Amoxicillin, Amoxicillin-pot clavulanate, Amoxil [amoxicillin trihydrate], Penicillins, and Latex  Medications: Prior to Admission medications  Medication Sig Start Date End Date Taking? Authorizing Provider  acetaminophen -codeine  (TYLENOL  #3) 300-30 MG tablet Take 1 tablet by mouth every 6 (six) hours as needed for  moderate pain. 11/17/22   Kuzma, Kevin, MD  Belimumab  (BENLYSTA ) 200 MG/ML SOAJ Inject 200 mg into the skin every Friday.    [provider]  dexamethasone  (DECADRON ) 10 MG/ML injection Inject 10 mg into the vein every 8 (eight) weeks.    [provider]  DULoxetine (CYMBALTA) 30 MG capsule Take 30 mg by mouth 2 (two) times daily.    [provider]  hydroxychloroquine (PLAQUENIL) 200 MG tablet Take 200 mg by mouth daily.    [provider]  naproxen sodium (ALEVE) 220 MG tablet Take 220 mg by mouth daily as needed (pain).    [provider]  rosuvastatin (CRESTOR) 10 MG tablet Take by mouth. 11/02/22 12/05/23  [provider]  triamcinolone ointment (KENALOG) 0.1 % Apply 1 application topically 2 (two) times daily as needed (rash).    [provider]     No family history on file.  Social History   Socioeconomic History   Marital status: Divorced    Spouse name: Not on file   Number of children: Not on file   Years of education: Not on file   Highest education level: Not on file  Occupational History   Not on file  Tobacco Use   Smoking status: Never   Smokeless tobacco: Never  Substance and Sexual Activity   Alcohol use: Not Currently    Comment: occas-maybe once a month   Drug use: No   Sexual activity: Not on file    Comment: Hyst  Other Topics Concern   Not on file  Social History Narrative   ** Merged History Encounter **       Social Drivers of Health   Tobacco Use: Low Risk (12/05/2023)   Patient History    Smoking Tobacco Use: Never    Smokeless Tobacco Use: Never    Passive Exposure: Not on file  Financial Resource Strain: Patient Declined (10/28/2022)   Received from [facility]   Overall Financial Resource Strain (CARDIA)    Difficulty of Paying Living Expenses: Patient declined  Food Insecurity: Low Risk (02/08/2023)   Received from [facility]   Epic    Within the past 12 months, you  worried that your food would run out before you got money to buy more: Never true    Within the past 12 months, the food you bought just didn't last and you didn't have money to get more. : Never true  Transportation Needs: No Transportation Needs (02/08/2023)   Received from [facility]    In the past 12 months, has lack of reliable transportation kept you from medical appointments, meetings, work or from getting things needed for daily living? : No  Physical Activity: Not on file  Stress: Not on file  Social Connections: Not on file  Depression (EYV7-0): Not on file  Alcohol Screen: Not on file  Housing: Low Risk (02/08/2023)   Received from [facility]  What is your living situation today?: I have a steady place to live    Think about the place you live. Do you have problems with any of the following? Choose all that apply:: None/None on this list  Utilities: Low Risk (02/08/2023)   Received from [facility]   Utilities    In the past 12 months has the electric, gas, oil, or water company threatened to shut off services in your home? : No  Health Literacy: Not on file     Review of Systems: A 12 point ROS discussed and pertinent positives are indicated in the HPI above.  All other systems are negative.  Vital Signs: There were no vitals taken for this visit.  *** Physical Exam     Imaging: No results found.  Labs:  CBC: No results for input(s): WBC, HGB, HCT, PLT in the last 8760 hours.  COAGS: No results for input(s): INR, APTT in the last 8760 hours.  BMP: No results for input(s): NA, K, CL, CO2, GLUCOSE, BUN, CALCIUM, CREATININE, GFRNONAA, GFRAA in the last 8760 hours.  Invalid input(s): CMP  LIVER FUNCTION TESTS: No results for input(s): BILITOT, AST, ALT, ALKPHOS, PROT, ALBUMIN in the last 8760 hours.  TUMOR MARKERS: No results for input(s): AFPTM, CEA, CA199, CHROMGRNA  in the last 8760 hours.  Assessment and Plan: 58 y.o. female with enlarging left renal angiomyolipoma who presents for angioembolization targeting the mesenteric vasculature.  NPO since MN *** VSS CBC BMP INR *** Not on AC/AP Allergies reviewed   Risks and benefits of left renal angiomyolipoma and angioembolization were discussed with the patient including, but not limited to bleeding, infection, vascular injury, contrast induced renal failure or unsuccessful treatment.  This interventional procedure involves the use of X-rays and because of the nature of the planned procedure, it is possible that we will have prolonged use of X-ray fluoroscopy.  Potential radiation risks to you include (but are not limited to) the following: - A slightly elevated risk for cancer  several years later in life. This risk is typically less than 0.5% percent. This risk is low in comparison to the normal incidence of human cancer, which is 33% for women and 50% for men according to the [employer] Society. - Radiation induced injury can include skin redness, resembling a rash, tissue breakdown / ulcers and hair loss (which can be temporary or permanent).   The likelihood of either of these occurring depends on the difficulty of the procedure and whether you are sensitive to radiation due to previous procedures, disease, or genetic conditions.   IF your procedure requires a prolonged use of radiation, you will be notified and given written instructions for further action.  It is your responsibility to monitor the irradiated area for the 2 weeks following the procedure and to notify your physician if you are concerned that you have suffered a radiation induced injury.    All of the patient's questions were answered, patient is agreeable to proceed.  Consent signed and in chart.     Thank you for this interesting consult.  I greatly enjoyed meeting Dawn Marsh and look forward to participating in  their care.  A copy of this report was sent to the requesting provider on this date.  Electronically Signed: Toya VEAR Cousin, PA-C 02/27/2024, 1:28 PM   I spent a total of    25 Minutes in face to face in clinical consultation, greater than 50% of which was counseling/coordinating care for left renal  angiomyolipoma and angioembolization.   This chart was dictated using voice recognition software.  Despite best efforts to proofread,  errors can occur which can change the documentation meaning.

## 2024-02-29 ENCOUNTER — Other Ambulatory Visit: Payer: Self-pay | Admitting: Diagnostic Radiology

## 2024-02-29 DIAGNOSIS — Z01818 Encounter for other preprocedural examination: Secondary | ICD-10-CM

## 2024-03-01 ENCOUNTER — Encounter (HOSPITAL_COMMUNITY): Payer: Self-pay

## 2024-03-01 ENCOUNTER — Other Ambulatory Visit (HOSPITAL_COMMUNITY): Payer: Self-pay | Admitting: Interventional Radiology

## 2024-03-01 ENCOUNTER — Ambulatory Visit (HOSPITAL_COMMUNITY)
Admission: RE | Admit: 2024-03-01 | Discharge: 2024-03-01 | Disposition: A | Discharge status: Home / Self Care | Source: Ambulatory Visit | Attending: Interventional Radiology | Admitting: Interventional Radiology

## 2024-03-01 ENCOUNTER — Other Ambulatory Visit: Payer: Self-pay

## 2024-03-01 DIAGNOSIS — D1771 Benign lipomatous neoplasm of kidney: Secondary | ICD-10-CM

## 2024-03-01 DIAGNOSIS — Z01818 Encounter for other preprocedural examination: Secondary | ICD-10-CM

## 2024-03-01 HISTORY — PX: IR RENAL SELECTIVE  UNI INC S&I MOD SED: IMG654

## 2024-03-01 HISTORY — PX: IR ANGIOGRAM SELECTIVE EACH ADDITIONAL VESSEL: IMG667

## 2024-03-01 HISTORY — PX: IR EMBO ARTERIAL NOT HEMORR HEMANG INC GUIDE ROADMAPPING: IMG5448

## 2024-03-01 HISTORY — PX: IR ANGIOGRAM VISCERAL SELECTIVE: IMG657

## 2024-03-01 HISTORY — PX: IR US GUIDE VASC ACCESS RIGHT: IMG2390

## 2024-03-01 LAB — PROTIME-INR
INR: 0.9 (ref 0.8–1.2)
Prothrombin Time: 12.4 s (ref 11.4–15.2)

## 2024-03-01 LAB — COMPREHENSIVE METABOLIC PANEL WITH GFR
ALT: 26 U/L (ref 0–44)
AST: 29 U/L (ref 15–41)
Albumin: 4.5 g/dL (ref 3.5–5.0)
Alkaline Phosphatase: 168 U/L — ABNORMAL HIGH (ref 38–126)
Anion gap: 9 (ref 5–15)
BUN: 19 mg/dL (ref 6–20)
CO2: 26 mmol/L (ref 22–32)
Calcium: 9.4 mg/dL (ref 8.9–10.3)
Chloride: 104 mmol/L (ref 98–111)
Creatinine, Ser: 0.96 mg/dL (ref 0.44–1.00)
GFR, Estimated: >60 mL/min
Glucose, Bld: 90 mg/dL (ref 70–99)
Potassium: 3.5 mmol/L (ref 3.5–5.1)
Sodium: 139 mmol/L (ref 135–145)
Total Bilirubin: 0.6 mg/dL (ref 0.0–1.2)
Total Protein: 7.3 g/dL (ref 6.5–8.1)

## 2024-03-01 LAB — CBC
HCT: 42.6 % (ref 36.0–46.0)
Hemoglobin: 14.2 g/dL (ref 12.0–15.0)
MCH: 32.3 pg (ref 26.0–34.0)
MCHC: 33.3 g/dL (ref 30.0–36.0)
MCV: 96.8 fL (ref 80.0–100.0)
Platelets: 337 K/uL (ref 150–400)
RBC: 4.4 MIL/uL (ref 3.87–5.11)
RDW: 12.4 % (ref 11.5–15.5)
WBC: 4.9 K/uL (ref 4.0–10.5)
nRBC: 0 % (ref 0.0–0.2)

## 2024-03-01 MED ORDER — MIDAZOLAM HCL (PF) 2 MG/2ML IJ SOLN
INTRAMUSCULAR | Status: AC | PRN
  Administered 2024-03-01 (×4): .5 mg via INTRAVENOUS
  Administered 2024-03-01: 1 mg via INTRAVENOUS

## 2024-03-01 MED ORDER — ONDANSETRON HCL 4 MG/2ML IJ SOLN
INTRAMUSCULAR | Status: AC
  Filled 2024-03-01: qty 2

## 2024-03-01 MED ORDER — IOHEXOL 300 MG/ML  SOLN
100.0000 mL | Freq: Once | INTRAMUSCULAR | Status: AC | PRN
  Administered 2024-03-01: 50 mL via INTRA_ARTERIAL

## 2024-03-01 MED ORDER — ONDANSETRON HCL 4 MG/2ML IJ SOLN: 8.0000 mg | Freq: Once | INTRAMUSCULAR | Status: DC

## 2024-03-01 MED ORDER — DEXAMETHASONE SOD PHOSPHATE PF 10 MG/ML IJ SOLN
INTRAMUSCULAR | Status: AC | PRN
  Administered 2024-03-01: 8 mg via INTRAVENOUS

## 2024-03-01 MED ORDER — LIDOCAINE HCL 1 % IJ SOLN
20.0000 mL | Freq: Once | INTRAMUSCULAR | Status: AC
  Administered 2024-03-01: 20 mL

## 2024-03-01 MED ORDER — FENTANYL CITRATE (PF) 100 MCG/2ML IJ SOLN
INTRAMUSCULAR | Status: AC
  Filled 2024-03-01: qty 2

## 2024-03-01 MED ORDER — MIDAZOLAM HCL 2 MG/2ML IJ SOLN
INTRAMUSCULAR | Status: AC
  Filled 2024-03-01: qty 2

## 2024-03-01 MED ORDER — SODIUM CHLORIDE 0.9 % IV SOLN: INTRAVENOUS | Status: DC

## 2024-03-01 MED ORDER — LIDOCAINE HCL 1 % IJ SOLN
INTRAMUSCULAR | Status: AC
  Filled 2024-03-01: qty 20

## 2024-03-01 MED ORDER — DEXAMETHASONE SOD PHOSPHATE PF 10 MG/ML IJ SOLN: 8.0000 mg | INTRAMUSCULAR | Status: AC

## 2024-03-01 MED ORDER — IOHEXOL 300 MG/ML  SOLN
150.0000 mL | Freq: Once | INTRAMUSCULAR | Status: AC | PRN
  Administered 2024-03-01: 60 mL via INTRA_ARTERIAL

## 2024-03-01 MED ORDER — FENTANYL CITRATE (PF) 100 MCG/2ML IJ SOLN
INTRAMUSCULAR | Status: AC | PRN
  Administered 2024-03-01: 25 ug via INTRAVENOUS
  Administered 2024-03-01: 50 ug via INTRAVENOUS
  Administered 2024-03-01 (×2): 25 ug via INTRAVENOUS

## 2024-03-01 MED ORDER — ONDANSETRON HCL 4 MG/2ML IJ SOLN
INTRAMUSCULAR | Status: AC | PRN
  Administered 2024-03-01: 4 mg via INTRAVENOUS

## 2024-03-01 NOTE — Sedation Documentation (Signed)
5 Fr Celt deployed right groin

## 2024-03-01 NOTE — Procedures (Signed)
 Interventional Radiology Procedure Note  Procedure: left renal angio embo of the lower pole AML    Complications: None  Estimated Blood Loss:  min  Findings: Successful embosphere and microcoil embo of the dominant feeder Full report in pacs     EMERSON FREDERIC SPECKING, MD

## 2024-03-01 NOTE — Sedation Documentation (Signed)
 Patient transported back to short stay. Kayla RN at the bedside to receive patient handoff. Right groin is clean, dry and intact. No drainage from site. Soft to palpation, no hematoma noted. Distal pulses intact via doppler. Bedrest x 3 hours, HOB 30 degrees

## 2024-03-01 NOTE — Progress Notes (Signed)
 Report received from [facility], D/C instructions already provided per previous RN. Patient able to ambulate and void without any issues, site is clean,dry soft and intact upon discharge.

## 2024-03-01 NOTE — Discharge Instructions (Signed)
 Femoral Site Care This sheet gives you information about how to care for yourself after your procedure. Your health care provider may also give you more specific instructions. If you have problems or questions, contact your health care provider. What can I expect after the procedure?  After the procedure, it is common to have: Bruising that usually fades within 1-2 weeks. Tenderness at the site. Follow these instructions at home: Wound care Follow instructions from your health care provider about how to take care of your insertion site. Make sure you: Wash your hands with soap and water before you change your bandage (dressing). If soap and water are not available, use hand sanitizer. Remove your dressing as told by your health care provider. In 24 hours Do not take baths, swim, or use a hot tub until your health care provider approves. You may shower 24-48 hours after the procedure or as told by your health care provider. Gently wash the site with plain soap and water. Pat the area dry with a clean towel. Do not rub the site. This may cause bleeding. Do not apply powder or lotion to the site. Keep the site clean and dry. Check your femoral site every day for signs of infection. Check for: Redness, swelling, or pain. Fluid or blood. Warmth. Pus or a bad smell. Activity For the first 2-3 days after your procedure, or as long as directed: Avoid climbing stairs as much as possible. Do not squat. Do not lift anything that is heavier than 10 lb (4.5 kg), or the limit that you are told, until your health care provider says that it is safe. For 5 days Rest as directed. Avoid sitting for a long time without moving. Get up to take short walks every 1-2 hours. Do not drive for 24 hours if you were given a medicine to help you relax (sedative). General instructions Take over-the-counter and prescription medicines only as told by your health care provider. Keep all follow-up visits as told by  your health care provider. This is important. Contact a health care provider if you have: A fever or chills. You have redness, swelling, or pain around your insertion site. Get help right away if: The catheter insertion area swells very fast. You pass out. You suddenly start to sweat or your skin gets clammy. The catheter insertion area is bleeding, and the bleeding does not stop when you hold steady pressure on the area. The area near or just beyond the catheter insertion site becomes pale, cool, tingly, or numb. These symptoms may represent a serious problem that is an emergency. Do not wait to see if the symptoms will go away. Get medical help right away. Call your local emergency services (911 in the [place] Do not drive yourself to the hospital. Summary After the procedure, it is common to have bruising that usually fades within 1-2 weeks. Check your femoral site every day for signs of infection. Do not lift anything that is heavier than 10 lb (4.5 kg), or the limit that you are told, until your health care provider says that it is safe. This information is not intended to replace advice given to you by your health care provider. Make sure you discuss any questions you have with your health care provider. Document Revised: 03/13/2017 Document Reviewed: 03/13/2017 Elsevier Patient Education  2020 [employer].

## 2024-03-02 DIAGNOSIS — M329 Systemic lupus erythematosus, unspecified: Principal | ICD-10-CM

## 2024-03-02 MED ORDER — HYDROXYCHLOROQUINE 200 MG TABLET
ORAL_TABLET | Freq: Every day | ORAL | 3 refills | 0.00000 days
Start: 2024-03-02 — End: ?

## 2024-03-04 ENCOUNTER — Encounter: Payer: Self-pay | Admitting: *Deleted

## 2024-03-04 MED ORDER — HYDROXYCHLOROQUINE 200 MG TABLET
ORAL_TABLET | Freq: Every day | ORAL | 3 refills | 90.00000 days
Start: 2024-03-04 — End: ?

## 2024-05-24 IMAGING — CT CT ABDOMEN WO/W CM
3 of 13 series · 10 of 46 positions shown, 16 images · IV contrast (agent unspecified)
Comparison: CT scan 08/14/2020

CLINICAL DATA: Follow-up left renal angiomyolipoma. Attempted
embolization 11/12/2020

EXAM:
CT ABDOMEN WITHOUT AND WITH CONTRAST
TECHNIQUE: Multidetector CT imaging of the abdomen was performed following the
standard protocol before and following the bolus administration of
intravenous contrast.

[Series 3: axial arterial · axial · arterial · 0.63mm/px · z∈[-243,-99]mm · 4 of 82 slices shown, 9 images]
[im 17/82  soft-tissue]
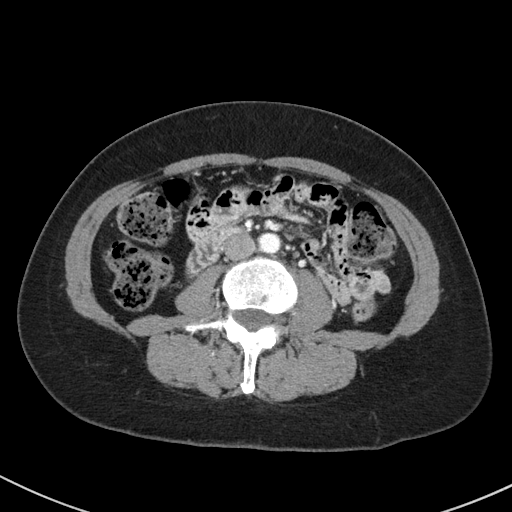
[im 17/82  lung]
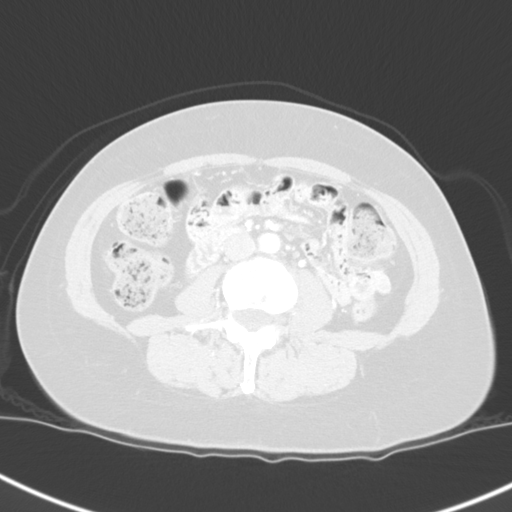
[im 17/82  bone]
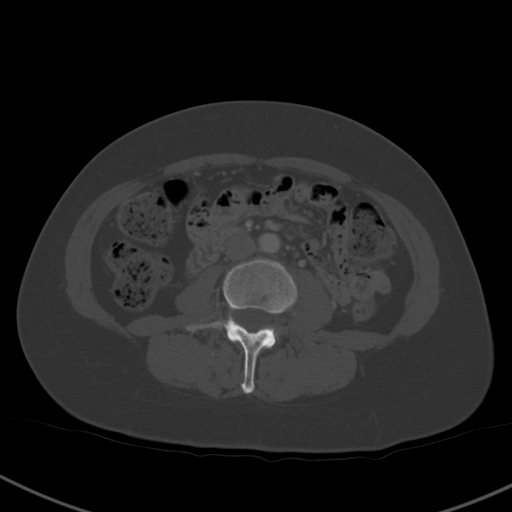
[im 33/82  soft-tissue]
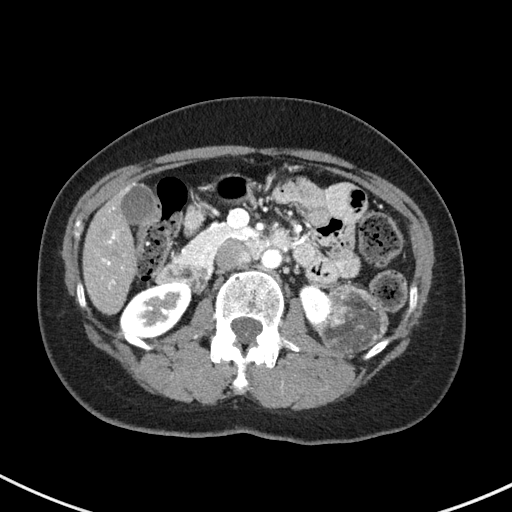
[im 33/82  lung]
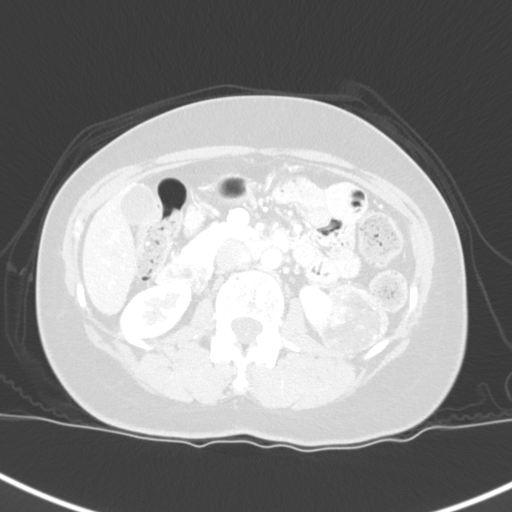
[im 49/82  soft-tissue]
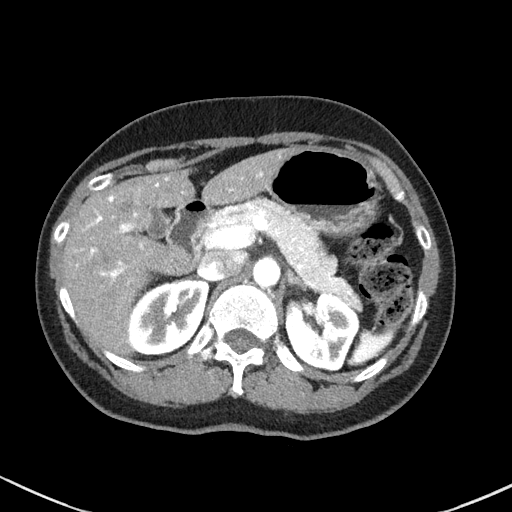
[im 49/82  lung]
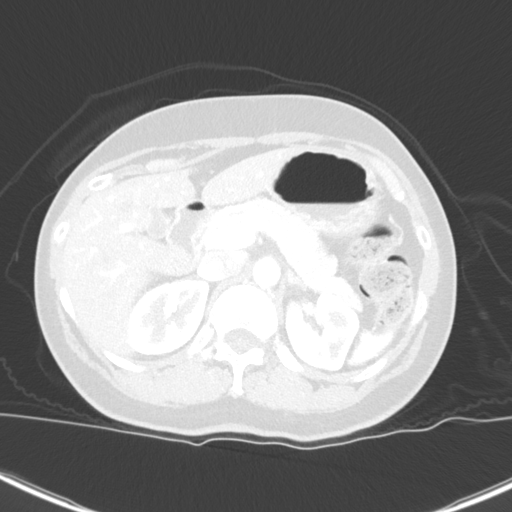
[im 65/82  soft-tissue]
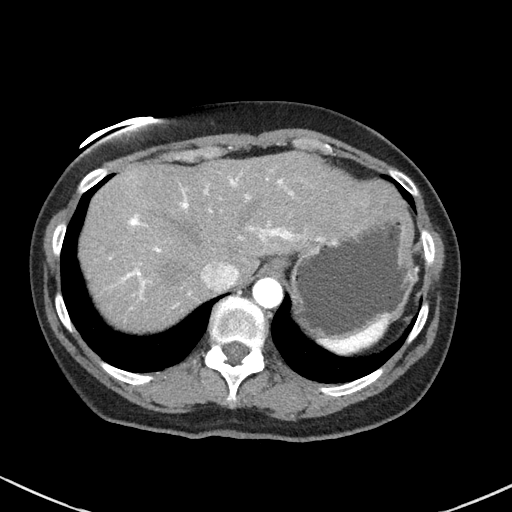
[im 65/82  lung]
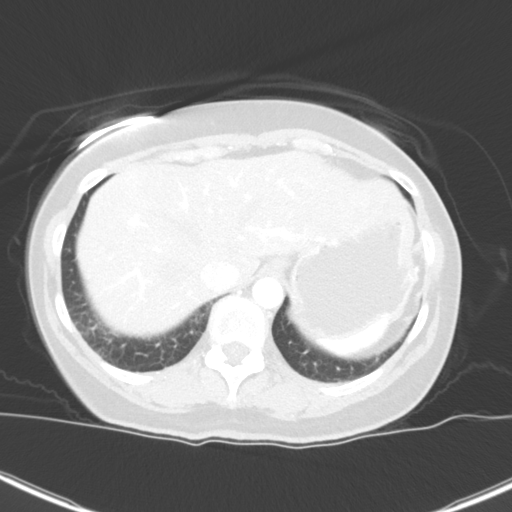

[Series 4: axial nephro · axial · 0.63mm/px · z∈[-243,-99]mm · 4 of 82 slices shown]
[im 17/82  soft-tissue]
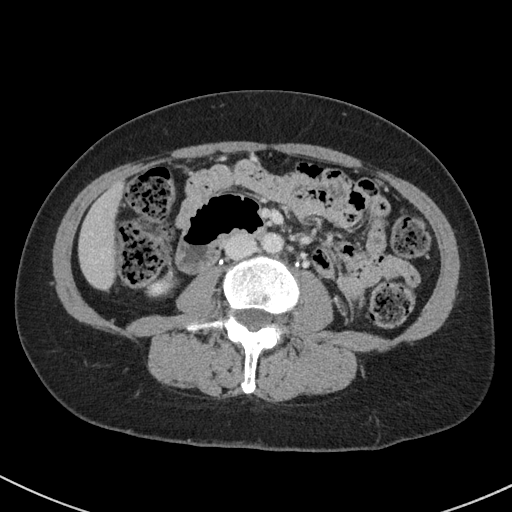
[im 33/82  soft-tissue]
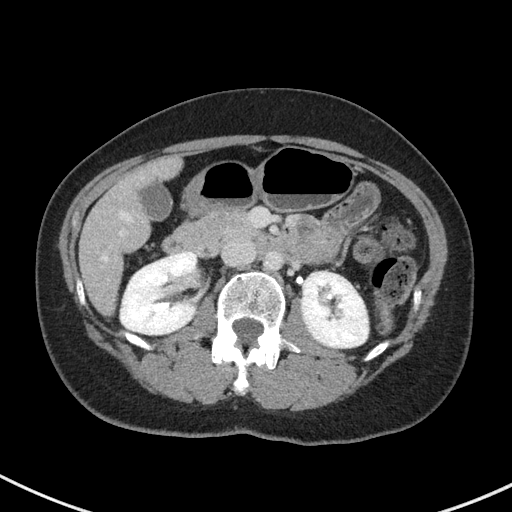
[im 49/82  soft-tissue]
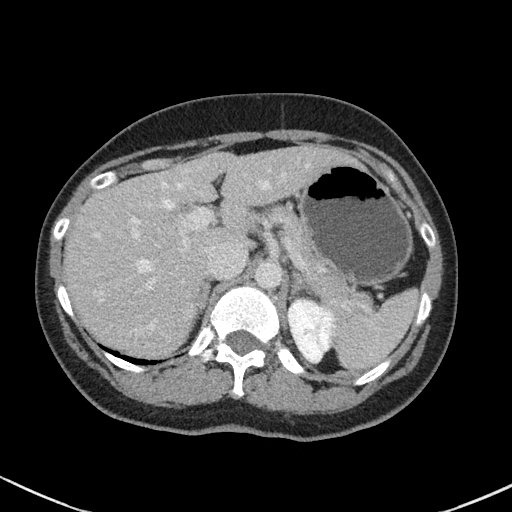
[im 65/82  soft-tissue]
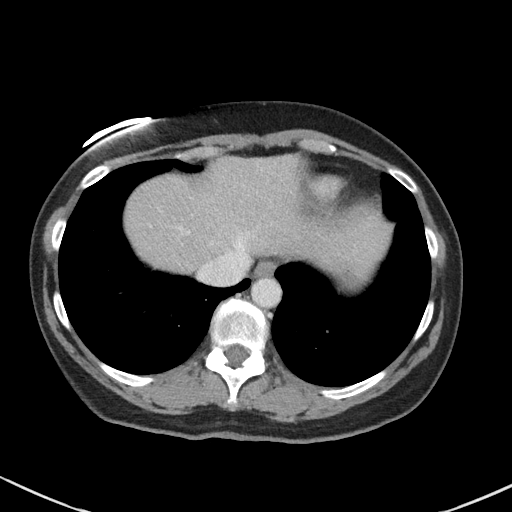

[Series 7: coronal pre · coronal · non-contrast · 0.50mm/px · 2 of 101 slices shown, 3 images]
[im 34/101  soft-tissue]
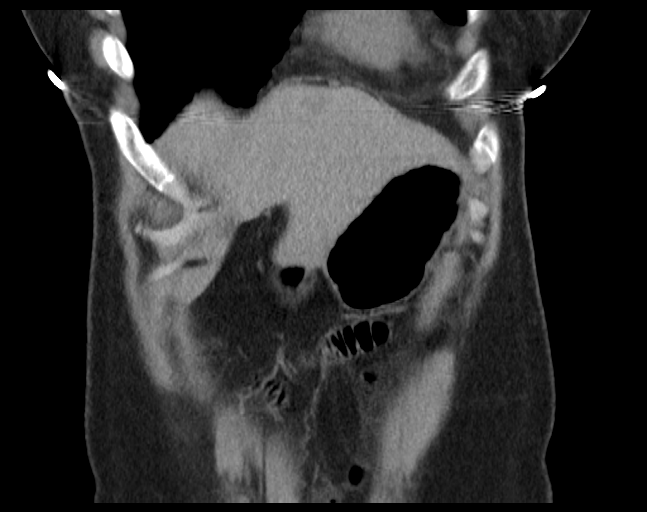
[im 34/101  bone]
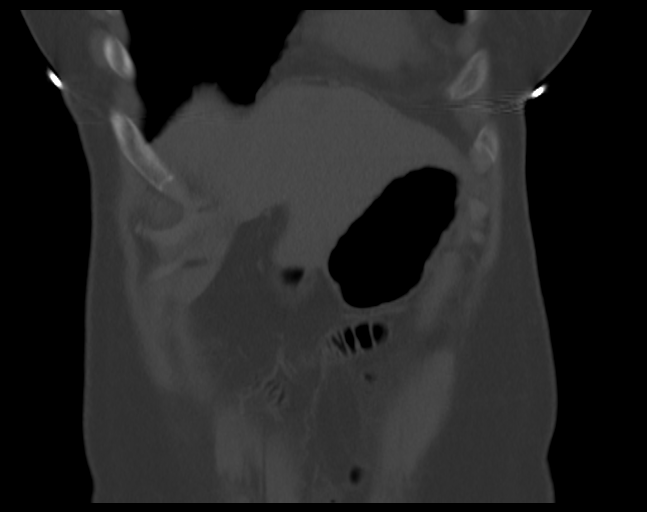
[im 67/101  soft-tissue]
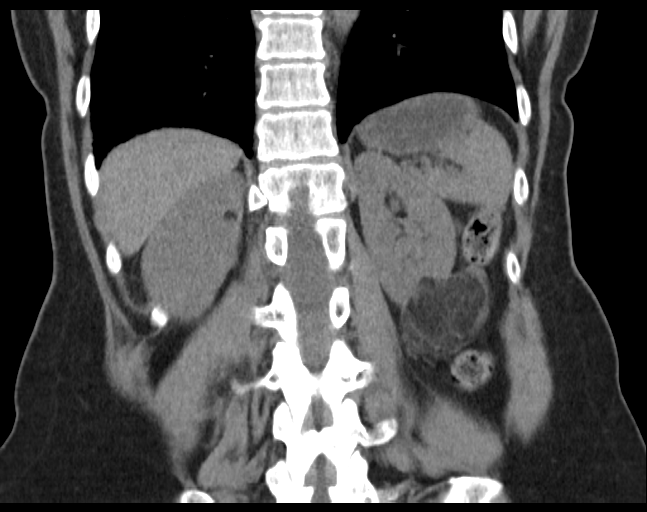

[10 of 46 positions shown; findings below may reference images not displayed]

RADIATION DOSE REDUCTION: This exam was performed according to the
departmental dose-optimization program which includes automated
exposure control, adjustment of the mA and/or kV according to
patient size and/or use of iterative reconstruction technique.

CONTRAST:  100mL OMNIPAQUE IOHEXOL 300 MG/ML  SOLN
FINDINGS: Lower chest: The lung bases are clear of acute process. No pleural
effusion or pulmonary lesions. The heart is normal in size. No
pericardial effusion. The distal esophagus and aorta are
unremarkable.

Hepatobiliary: Stable 2 cm segment 2 hemangioma. No worrisome
hepatic lesions or intrahepatic biliary dilatation. The portal and
hepatic veins are patent. Gallbladder is unremarkable. No common
bile duct dilatation.

Pancreas: No mass, inflammation or ductal dilatation.

Spleen: Normal size. No focal lesions. Stable small adjacent
splenule.

Adrenals/Urinary Tract: The adrenal glands are normal.

Large exophytic angiomyolipoma projecting off the lower pole region
of left kidney measures approximately 4.3 x 4.2 x 4.0 cm. On the
prior study 1 year ago it measured 4.0 x 4.0 x 4.0 cm. Stable areas
of mild contrast enhancement.

Stable small angiomyolipoma involving the upper pole region of the
right kidney. This measures a maximum 7 mm.

Stable cortical scarring changes involving the left kidney
laterally. No new renal lesions. No hydronephrosis. No collecting
system abnormalities on the delayed images.

Stomach/Bowel: The stomach, duodenum, small bowel and colon are
unremarkable.

Vascular/Lymphatic: The aorta and branch vessels are patent. No
atherosclerotic calcifications, aneurysm or dissection. No abdominal
lymphadenopathy. The major venous structures are patent.

Other: No ascites or abdominal wall hernia.

Musculoskeletal: No significant bony findings.
IMPRESSION: 1. Minimal interval increase in size of the exophytic angiomyolipoma
projecting off the lower pole region of the left kidney.
2. Stable small angiomyolipoma involving the upper pole region of
the right kidney.
3. Stable cortical scarring changes involving the left kidney
laterally.
4. Stable 2 cm segment 2 hepatic hemangioma.
